# Patient Record
Sex: Female | Born: 1960 | Race: White | Hispanic: Refuse to answer | Smoking: Former smoker
Health system: Southern US, Community
[De-identification: ages and names within clinical notes are randomized; demographics above are authoritative.]

## PROBLEM LIST (undated history)

## (undated) DIAGNOSIS — Z9889 Other specified postprocedural states: Secondary | ICD-10-CM

## (undated) DIAGNOSIS — T7840XA Allergy, unspecified, initial encounter: Secondary | ICD-10-CM

## (undated) DIAGNOSIS — K219 Gastro-esophageal reflux disease without esophagitis: Secondary | ICD-10-CM

## (undated) DIAGNOSIS — R112 Nausea with vomiting, unspecified: Secondary | ICD-10-CM

## (undated) DIAGNOSIS — D126 Benign neoplasm of colon, unspecified: Secondary | ICD-10-CM

## (undated) DIAGNOSIS — K648 Other hemorrhoids: Secondary | ICD-10-CM

## (undated) DIAGNOSIS — C801 Malignant (primary) neoplasm, unspecified: Secondary | ICD-10-CM

## (undated) DIAGNOSIS — K5730 Diverticulosis of large intestine without perforation or abscess without bleeding: Secondary | ICD-10-CM

## (undated) DIAGNOSIS — I10 Essential (primary) hypertension: Secondary | ICD-10-CM

## (undated) DIAGNOSIS — F419 Anxiety disorder, unspecified: Secondary | ICD-10-CM

## (undated) DIAGNOSIS — G43909 Migraine, unspecified, not intractable, without status migrainosus: Secondary | ICD-10-CM

## (undated) DIAGNOSIS — F329 Major depressive disorder, single episode, unspecified: Secondary | ICD-10-CM

## (undated) HISTORY — DX: Gastro-esophageal reflux disease without esophagitis: K21.9

## (undated) HISTORY — DX: Allergy, unspecified, initial encounter: T78.40XA

## (undated) HISTORY — PX: TONSILLECTOMY: SUR1361

## (undated) HISTORY — DX: Benign neoplasm of colon, unspecified: D12.6

## (undated) HISTORY — DX: Anxiety disorder, unspecified: F41.9

## (undated) HISTORY — DX: Migraine, unspecified, not intractable, without status migrainosus: G43.909

## (undated) HISTORY — DX: Major depressive disorder, single episode, unspecified: F32.9

## (undated) HISTORY — PX: COLONOSCOPY: SHX174

## (undated) HISTORY — PX: BREAST SURGERY: SHX581

## (undated) HISTORY — DX: Other hemorrhoids: K64.8

## (undated) HISTORY — DX: Essential (primary) hypertension: I10

## (undated) HISTORY — DX: Malignant (primary) neoplasm, unspecified: C80.1

## (undated) HISTORY — DX: Diverticulosis of large intestine without perforation or abscess without bleeding: K57.30

---

## 1999-12-17 ENCOUNTER — Encounter: Admission: RE | Admit: 1999-12-17 | Discharge: 2000-03-16 | Payer: Self-pay | Admitting: Orthopaedic Surgery

## 2000-01-17 ENCOUNTER — Encounter: Payer: Self-pay | Admitting: Orthopedic Surgery

## 2000-01-17 ENCOUNTER — Ambulatory Visit (HOSPITAL_COMMUNITY): Admission: RE | Admit: 2000-01-17 | Discharge: 2000-01-17 | Payer: Self-pay | Admitting: Orthopedic Surgery

## 2004-04-25 ENCOUNTER — Emergency Department (HOSPITAL_COMMUNITY): Admission: EM | Admit: 2004-04-25 | Discharge: 2004-04-25 | Payer: Self-pay | Admitting: Emergency Medicine

## 2009-02-27 ENCOUNTER — Encounter (INDEPENDENT_AMBULATORY_CARE_PROVIDER_SITE_OTHER): Payer: Self-pay | Admitting: Plastic Surgery

## 2009-02-27 ENCOUNTER — Ambulatory Visit (HOSPITAL_BASED_OUTPATIENT_CLINIC_OR_DEPARTMENT_OTHER): Admission: RE | Admit: 2009-02-27 | Discharge: 2009-02-27 | Payer: Self-pay | Admitting: Plastic Surgery

## 2009-02-27 ENCOUNTER — Encounter (INDEPENDENT_AMBULATORY_CARE_PROVIDER_SITE_OTHER): Payer: Self-pay | Admitting: *Deleted

## 2009-05-02 ENCOUNTER — Encounter: Payer: Self-pay | Admitting: Gastroenterology

## 2009-05-23 ENCOUNTER — Ambulatory Visit: Payer: Self-pay | Admitting: Oncology

## 2009-06-07 ENCOUNTER — Ambulatory Visit: Payer: Self-pay | Admitting: Gastroenterology

## 2009-06-07 DIAGNOSIS — R1032 Left lower quadrant pain: Secondary | ICD-10-CM | POA: Insufficient documentation

## 2009-06-07 DIAGNOSIS — I10 Essential (primary) hypertension: Secondary | ICD-10-CM

## 2009-06-14 ENCOUNTER — Ambulatory Visit: Payer: Self-pay | Admitting: Gastroenterology

## 2009-06-27 ENCOUNTER — Encounter: Payer: Self-pay | Admitting: Gastroenterology

## 2009-07-02 ENCOUNTER — Ambulatory Visit: Payer: Self-pay | Admitting: Oncology

## 2009-07-02 LAB — COMPREHENSIVE METABOLIC PANEL
Albumin: 4.2 g/dL (ref 3.5–5.2)
CO2: 24 mEq/L (ref 19–32)
Calcium: 8.7 mg/dL (ref 8.4–10.5)
Chloride: 107 mEq/L (ref 96–112)
Glucose, Bld: 81 mg/dL (ref 70–99)
Potassium: 3.5 mEq/L (ref 3.5–5.3)
Sodium: 139 mEq/L (ref 135–145)
Total Protein: 7 g/dL (ref 6.0–8.3)

## 2009-07-02 LAB — CBC WITH DIFFERENTIAL/PLATELET
Basophils Absolute: 0.2 10*3/uL — ABNORMAL HIGH (ref 0.0–0.1)
Eosinophils Absolute: 0.1 10*3/uL (ref 0.0–0.5)
HGB: 12.4 g/dL (ref 11.6–15.9)
MONO#: 0.9 10*3/uL (ref 0.1–0.9)
NEUT#: 7.6 10*3/uL — ABNORMAL HIGH (ref 1.5–6.5)
RBC: 4.53 10*6/uL (ref 3.70–5.45)
RDW: 17.9 % — ABNORMAL HIGH (ref 11.2–14.5)
WBC: 12.7 10*3/uL — ABNORMAL HIGH (ref 3.9–10.3)

## 2009-08-30 ENCOUNTER — Ambulatory Visit: Payer: Self-pay | Admitting: Oncology

## 2009-09-03 LAB — CBC WITH DIFFERENTIAL/PLATELET
BASO%: 0.4 % (ref 0.0–2.0)
Eosinophils Absolute: 0.1 10*3/uL (ref 0.0–0.5)
MCHC: 33 g/dL (ref 31.5–36.0)
MONO#: 0.6 10*3/uL (ref 0.1–0.9)
NEUT#: 5.8 10*3/uL (ref 1.5–6.5)
RBC: 4.73 10*6/uL (ref 3.70–5.45)
WBC: 8.9 10*3/uL (ref 3.9–10.3)
lymph#: 2.3 10*3/uL (ref 0.9–3.3)

## 2009-09-03 LAB — COMPREHENSIVE METABOLIC PANEL
ALT: 15 U/L (ref 0–35)
Albumin: 4 g/dL (ref 3.5–5.2)
CO2: 23 mEq/L (ref 19–32)
Calcium: 9 mg/dL (ref 8.4–10.5)
Chloride: 107 mEq/L (ref 96–112)
Potassium: 3.8 mEq/L (ref 3.5–5.3)
Sodium: 141 mEq/L (ref 135–145)
Total Protein: 6.8 g/dL (ref 6.0–8.3)

## 2010-02-05 ENCOUNTER — Ambulatory Visit (HOSPITAL_COMMUNITY): Admission: RE | Admit: 2010-02-05 | Discharge: 2010-02-06 | Payer: Self-pay | Admitting: Obstetrics and Gynecology

## 2010-02-05 ENCOUNTER — Encounter (INDEPENDENT_AMBULATORY_CARE_PROVIDER_SITE_OTHER): Payer: Self-pay | Admitting: Obstetrics and Gynecology

## 2010-02-25 HISTORY — PX: ABDOMINAL HYSTERECTOMY: SHX81

## 2010-03-15 ENCOUNTER — Ambulatory Visit: Payer: Self-pay | Admitting: Oncology

## 2010-03-18 LAB — CBC WITH DIFFERENTIAL/PLATELET
BASO%: 0.4 % (ref 0.0–2.0)
Eosinophils Absolute: 0.1 10*3/uL (ref 0.0–0.5)
HCT: 38.4 % (ref 34.8–46.6)
MCHC: 33.4 g/dL (ref 31.5–36.0)
MONO#: 0.8 10*3/uL (ref 0.1–0.9)
NEUT#: 5.5 10*3/uL (ref 1.5–6.5)
NEUT%: 63.4 % (ref 38.4–76.8)
Platelets: 408 10*3/uL — ABNORMAL HIGH (ref 145–400)
WBC: 8.7 10*3/uL (ref 3.9–10.3)
lymph#: 2.3 10*3/uL (ref 0.9–3.3)

## 2010-03-18 LAB — COMPREHENSIVE METABOLIC PANEL
ALT: 12 U/L (ref 0–35)
CO2: 25 mEq/L (ref 19–32)
Calcium: 8.8 mg/dL (ref 8.4–10.5)
Chloride: 105 mEq/L (ref 96–112)
Creatinine, Ser: 0.83 mg/dL (ref 0.40–1.20)
Glucose, Bld: 104 mg/dL — ABNORMAL HIGH (ref 70–99)
Sodium: 140 mEq/L (ref 135–145)
Total Protein: 6.7 g/dL (ref 6.0–8.3)

## 2010-05-16 ENCOUNTER — Ambulatory Visit: Payer: Self-pay | Admitting: Oncology

## 2010-08-16 ENCOUNTER — Ambulatory Visit (HOSPITAL_BASED_OUTPATIENT_CLINIC_OR_DEPARTMENT_OTHER): Payer: 59 | Admitting: Oncology

## 2010-08-19 ENCOUNTER — Encounter: Payer: Self-pay | Admitting: Plastic Surgery

## 2010-10-13 LAB — SURGICAL PCR SCREEN: Staphylococcus aureus: NEGATIVE

## 2010-10-13 LAB — CBC
HCT: 33 % — ABNORMAL LOW (ref 36.0–46.0)
Hemoglobin: 11 g/dL — ABNORMAL LOW (ref 12.0–15.0)
Hemoglobin: 12.9 g/dL (ref 12.0–15.0)
MCH: 28.3 pg (ref 26.0–34.0)
MCHC: 33.1 g/dL (ref 30.0–36.0)
RBC: 3.83 MIL/uL — ABNORMAL LOW (ref 3.87–5.11)
RDW: 16.9 % — ABNORMAL HIGH (ref 11.5–15.5)
WBC: 17.4 10*3/uL — ABNORMAL HIGH (ref 4.0–10.5)

## 2010-10-13 LAB — COMPREHENSIVE METABOLIC PANEL
AST: 20 U/L (ref 0–37)
CO2: 24 mEq/L (ref 19–32)
Calcium: 8.8 mg/dL (ref 8.4–10.5)
Creatinine, Ser: 0.78 mg/dL (ref 0.4–1.2)
GFR calc Af Amer: >60 mL/min (ref >60)
GFR calc non Af Amer: >60 mL/min (ref >60)

## 2010-10-28 ENCOUNTER — Encounter (HOSPITAL_BASED_OUTPATIENT_CLINIC_OR_DEPARTMENT_OTHER): Payer: 59 | Admitting: Oncology

## 2010-10-28 DIAGNOSIS — R232 Flushing: Secondary | ICD-10-CM

## 2010-10-28 DIAGNOSIS — G43909 Migraine, unspecified, not intractable, without status migrainosus: Secondary | ICD-10-CM

## 2010-10-28 DIAGNOSIS — J40 Bronchitis, not specified as acute or chronic: Secondary | ICD-10-CM

## 2010-10-28 DIAGNOSIS — N6089 Other benign mammary dysplasias of unspecified breast: Secondary | ICD-10-CM

## 2010-10-28 DIAGNOSIS — F3289 Other specified depressive episodes: Secondary | ICD-10-CM

## 2010-10-28 DIAGNOSIS — F329 Major depressive disorder, single episode, unspecified: Secondary | ICD-10-CM

## 2010-10-28 LAB — CBC WITH DIFFERENTIAL/PLATELET
Basophils Absolute: 0.1 10*3/uL (ref 0.0–0.1)
Eosinophils Absolute: 0.1 10*3/uL (ref 0.0–0.5)
HCT: 41.2 % (ref 34.8–46.6)
HGB: 13.7 g/dL (ref 11.6–15.9)
LYMPH%: 27.2 % (ref 14.0–49.7)
MCV: 86.7 fL (ref 79.5–101.0)
MONO#: 1 10*3/uL — ABNORMAL HIGH (ref 0.1–0.9)
MONO%: 7.8 % (ref 0.0–14.0)
NEUT#: 8.2 10*3/uL — ABNORMAL HIGH (ref 1.5–6.5)
NEUT%: 63.5 % (ref 38.4–76.8)
Platelets: 308 10*3/uL (ref 145–400)
RBC: 4.75 10*6/uL (ref 3.70–5.45)
WBC: 12.9 10*3/uL — ABNORMAL HIGH (ref 3.9–10.3)

## 2010-10-28 LAB — COMPREHENSIVE METABOLIC PANEL
Albumin: 3.4 g/dL — ABNORMAL LOW (ref 3.5–5.2)
BUN: 16 mg/dL (ref 6–23)
CO2: 25 mEq/L (ref 19–32)
Glucose, Bld: 98 mg/dL (ref 70–99)
Sodium: 138 mEq/L (ref 135–145)
Total Bilirubin: 0.4 mg/dL (ref 0.3–1.2)
Total Protein: 6.4 g/dL (ref 6.0–8.3)

## 2010-11-02 LAB — BASIC METABOLIC PANEL
CO2: 27 mEq/L (ref 19–32)
Calcium: 9.3 mg/dL (ref 8.4–10.5)
GFR calc Af Amer: >60 mL/min (ref >60)
GFR calc non Af Amer: >60 mL/min (ref >60)
Potassium: 4.8 mEq/L (ref 3.5–5.1)
Sodium: 139 mEq/L (ref 135–145)

## 2010-12-10 NOTE — Op Note (Signed)
Deanna [name], Deanna [name]               ACCOUNT NO.:  [id number]   MEDICAL RECORD NO.:  [id number]          PATIENT TYPE:  AMB   LOCATION:  DSC                          FACILITY:  [facility]   PHYSICIAN:  Brantley Persons, M.D.DATE OF BIRTH:  1960/09/12   DATE OF PROCEDURE:  02/27/2009  DATE OF DISCHARGE:                               OPERATIVE REPORT   PREOPERATIVE DIAGNOSIS:  Bilateral macromastia.   POSTOPERATIVE DIAGNOSIS:  Bilateral macromastia.   PROCEDURE:  Bilateral reduction mammoplasties.   ATTENDING SURGEON:  Brantley Persons, MD   ANESTHESIA:  General.   ANESTHESIOLOGIST:  Zenon Mayo, MD   COMPLICATIONS:  None.   ESTIMATED BLOOD LOSS:  150 mL.   FLUID REPLACEMENT:  Crystalloid 3500 mL.   URINE OUTPUT:  650 mL.   INDICATIONS FOR PROCEDURE:  The patient is a 50 year old Caucasian  female who has bilateral macromastia that is clinically symptomatic.  She presents to undergo bilateral reduction mammoplasties.   PROCEDURE:  The patient was marked in preop holding area.  The pattern  of wise for the future bilateral reduction mammoplasties.  She was then  taken back to the OR, placed on the table in supine position.  After  adequate hemostasis and anesthesia had taken effect, the procedure was  begun.   Both of the breast reductions were performed in the following similar  manner.  The skin was marked with the 45 nipple marker on the nipple-  areolar complexes.  The nipple-areolar complex was incised as marked and  de-epithelialized around the nipple-areolar complex down to inframammary  crease and inferior pedicle pattern.   Next, the medial, superior and lateral skin flaps were elevated down to  the chest wall.  The excess fat and glandular tissue were removed from  the inferior pedicle.  The nipple-areolar complex was examined and found  be pink and viable.  The wound was irrigated with saline irrigation.  Meticulous hemostasis was obtained with the Bovie  electrocautery.  Inferior pedicle was centralized using 3-0 Prolene suture.  A #10 JP  flat fully fluted drain was placed into the wound.  The skin flaps were  brought together at the inverted T junction with a 2-0 Prolene suture.  The incisions were then stapled for temporary closure.  The breasts were  compared and found to have good shape asymmetry.  The incisions were  then closed from the medial aspect of the JP drains to the medial aspect  of the [facility] incision by first placing Monocryl sutures in the dermal  layer to loosely tack them together and then both the dermal and  cuticular layer were closed in a single layer using a 2-0 quill PDO  barbed suture.  Lateral to the JP drain, the incision was closed using 3-  0 Monocryl in the dermal layer followed by 3-0 Monocryl running  intracuticular stitch on the skin.  The vertical limb of the wise  pattern was closed in the dermal layer using 3-0 Monocryl suture.   The patient was then placed in the upright position.  The future  location of the nipple-areolar complex was as marked  in both breast  mounds using the nipple.  The 45-mm nipple areolar complex.  The patient  was then placed back in the recumbent position.   Both of the nipple areolar complexes were then brought in onto the  breast mounds in the following similar manner.  The skin was then  incised as marked and the soft tissue removed full thickness.  The  nipple-areolar complex was examined, found to be pink and viable then  brought out through this aperture and sewn in place using 4-0 Monocryl  and the dermal layer followed by 5-0 Monocryl intracuticular stitch on  the skin.  This 5-0 Monocryl suture was then brought down to close the  cuticular layer of the vertical limb of the wise pattern as well.  The  JP drains were sewn in place using 3-0 nylon suture.  The base of the  breast, skin, and soft tissues were then infiltrated with 0.5% Marcaine  with epinephrine.  The  incisions were dressed with Benzoin and Steri-  Strips and the nipples additionally with bacitracin ointment and  Adaptic.  The 4 x 4s were placed over the incisions and ABD pads in the  axillary areas.  The patient was then placed into light postoperative  support bra.  There were no complications.  The patient tolerated  procedure well.  The final needle and sponge counts were reported to be  correct at the end of the case.   The patient was then awakened from general anesthesia and taken to  recovery room in stable condition.  She was recovered without  complications.  She decided, she would rather go home to stay overnight;  this was appropriate.  The patient and her family were then given proper  preoperative and postoperative wound care instructions including care of  the JP drains.  She was then discharged home in the care of her family  in stable condition.  Follow up appointment will be on Friday in the  office.           ______________________________  Brantley Persons, M.D.     MC/MEDQ  D:  02/27/2009  T:  02/28/2009  Job:  [id number]

## 2011-02-26 HISTORY — PX: BREAST REDUCTION SURGERY: SHX8

## 2011-02-26 HISTORY — PX: NASAL SINUS SURGERY: SHX719

## 2011-07-14 ENCOUNTER — Encounter (INDEPENDENT_AMBULATORY_CARE_PROVIDER_SITE_OTHER): Payer: Self-pay | Admitting: Surgery

## 2011-07-14 ENCOUNTER — Ambulatory Visit (INDEPENDENT_AMBULATORY_CARE_PROVIDER_SITE_OTHER): Payer: 59 | Admitting: Surgery

## 2011-07-14 DIAGNOSIS — K219 Gastro-esophageal reflux disease without esophagitis: Secondary | ICD-10-CM

## 2011-07-14 DIAGNOSIS — K8010 Calculus of gallbladder with chronic cholecystitis without obstruction: Secondary | ICD-10-CM

## 2011-07-14 DIAGNOSIS — R1032 Left lower quadrant pain: Secondary | ICD-10-CM

## 2011-07-14 DIAGNOSIS — K5730 Diverticulosis of large intestine without perforation or abscess without bleeding: Secondary | ICD-10-CM

## 2011-07-14 HISTORY — DX: Diverticulosis of large intestine without perforation or abscess without bleeding: K57.30

## 2011-07-14 NOTE — Patient Instructions (Signed)
Gallbladder Disease Gallbladder disease (cholecystitis) is an inflammation of your gallbladder. It is usually caused by a build-up of stones (gallstones) or sludge (cholelithiasis) in your gallbladder. The gallbladder is not an essential organ. It is located slightly to the right of center in the belly (abdomen), behind the liver. It stores bile made in the liver. Bile aids in digestion of fats. Gallbladder disease may result in nausea (feeling sick to your stomach), abdominal pain, and jaundice. In severe cases, emergency surgery may be required. The most common type of gallbladder disease is gallstones. They begin as small crystals and slowly grow into stones. Gallstone pain occurs when the bile duct has spasms. The spasms are caused by the stone passing out of the duct. The stone is trying to pass at the same time bile is passing into the small bowel for digestion. The pain usually begins suddenly. It may persist from several minutes to several hours. Infection can occur. Infection can add to discomfort and severity of an acute attack. The pain may be made worse by breathing deeply or by being jarred. There may be fever and tenderness to the touch. In some cases, when gallstones do not move into the bile duct, people have no pain or symptoms. These are called "silent" gallstones. Women are three times more likely to develop gallstones than men. Women who have had several pregnancies are more likely to have gallbladder disease. Physicians sometimes advise removing diseased gallbladders before future pregnancies. Other factors that increase the risk of gallbladder disease are obesity, diets heavy in fried foods and dairy products, increasing age, prolonged use of medications containing female hormones, and heredity. HOME CARE INSTRUCTIONS   If your physician prescribed an antibiotic, take as directed.   Only take over-the-counter or prescription medicines for pain, discomfort, or fever as directed by your  caregiver.   Follow a low fat diet until seen again. (Fat causes the gallbladder to contract.)   Follow-up as instructed. Attacks are almost always recurrent and surgery is usually required for permanent treatment.  SEEK IMMEDIATE MEDICAL CARE IF:   Pain is increasing and not controlled by medications.   The pain moves to another part of your abdomen or to your back. (Right sided pain can be appendicitis and left sided pain in adults can be diverticulitis).   You have a fever.   You develop nausea and vomiting.  Document Released: 07/14/2005 Document Revised: 03/26/2011 Document Reviewed: 03/02/2008 [employer] Patient Information 2012 [employer]. 

## 2011-07-14 NOTE — Progress Notes (Addendum)
Subjective:     Patient ID: Deanna [name], female   DOB: 1960/09/16, 50 y.o.   MRN: [id number]  HPI  [name]  01/08/1961 [id number]  Patient Care Team: Wille Celeste as PCP - General (Internal Medicine) Louis Meckel, MD as Consulting Physician (Gastroenterology) Oliver Pila as Consulting Physician (Obstetrics and Gynecology)  This patient is a 50 y.o.female who presents today for surgical evaluation at the request of [name].   Reason for visit: Abdominal pain. History of gallstones. Question biliary etiology  Patient is an obese female with numerous abdominal complaints. She tells me she's had reflux issues on Prilosec daily. She feels like she is having some epigastric discomfort and radiation therapy well.  She intact her for presumed diverticulitis 2 years ago. She had a colonoscopy done by Dr. Arlyce Dice. He noted some resolving diverticulitis. Diverticula were in the sigmoid colon only. A few small polyps as well. The patient notes she gets some mild left lower quadrant pain as well. Her pain was in that location but this is not nearly as severe.  The patient notes about 2 months ago she began to start having problems. She noticed pain on her right lateral chest wall radiating to her right flank. Occasionally radiate to her left side. She would feel some indigestion. Prilosec did not help. He usually was after eating. He got worse around Thanksgiving. She saw her primary care physician. Workup including lab work was normal. Ultrasound did show gallstones. She's never had a CAT scan. She spell of stools and self profess diarrhea. Multiple has helped. I believe he C. difficile was checked and was negative. She normally has some mild constipation.  She is otherwise relatively active. This was not related activity. She can lie down. No fevers or chills or sweats. No nausea or vomiting. Some bloating at times. Her primary care doctor's notes to call some nausea and  vomiting the patient does not progress. However, the patient had a lot of difficulty recalling all these events.  Patient Active Problem List  Diagnoses  . ESSENTIAL HYPERTENSION, BENIGN  . ABDOMINAL PAIN, LEFT LOWER QUADRANT  . Chronic cholecystitis with calculus    No past medical history on file.  No past surgical history on file.  History   Social History  . Marital Status: Divorced    Spouse Name: N/A    Number of Children: N/A  . Years of Education: N/A   Occupational History  . Not on file.   Social History Main Topics  . Smoking status: Not on file  . Smokeless tobacco: Not on file  . Alcohol Use: Not on file  . Drug Use: Not on file  . Sexually Active: Not on file   Other Topics Concern  . Not on file   Social History Narrative  . No narrative on file    No family history on file.  Current outpatient prescriptions:BENICAR 40 MG tablet, daily., Disp: , Rfl: ;  buPROPion (WELLBUTRIN XL) 150 MG 24 hr tablet, 2 tablets daily., Disp: , Rfl: ;  diphenoxylate-atropine (LOMOTIL) 2.5-0.025 MG per tablet, as needed. , Disp: , Rfl: ;  furosemide (LASIX) 20 MG tablet, daily., Disp: , Rfl: ;  montelukast (SINGULAIR) 10 MG tablet, daily., Disp: , Rfl: ;  omeprazole (PRILOSEC) 20 MG capsule, daily., Disp: , Rfl:  SUMAtriptan (IMITREX) 100 MG tablet, Ad lib., Disp: , Rfl: ;  tamoxifen (NOLVADEX) 20 MG tablet, daily., Disp: , Rfl:   Allergies  Allergen Reactions  .  Iohexol Anaphylaxis    IBD dye    There were no vitals taken for this visit.     Review of Systems  Constitutional: Negative for fever, chills, diaphoresis, appetite change and fatigue.  HENT: Negative for ear pain, sore throat, trouble swallowing, neck pain and ear discharge.   Eyes: Negative for photophobia, discharge and visual disturbance.  Respiratory: Negative for cough, choking, chest tightness and shortness of breath.   Cardiovascular: Positive for chest pain. Negative for palpitations.        Patient walks 30 minutes for about 1.5 miles without difficulty.  No exertional chest/neck/shoulder/arm pain.   Gastrointestinal: Positive for abdominal pain and diarrhea. Negative for vomiting, constipation, blood in stool, abdominal distention, anal bleeding and rectal pain.       No personal nor family history of GI/colon cancer, inflammatory bowel disease, irritable bowel syndrome, allergy such as Celiac Sprue, dietary/dairy problems, colitis, ulcers nor gastritis.    No recent sick contacts/gastroenteritis.  No travel outside the country.  No changes in diet.    Genitourinary: Negative for dysuria, frequency and difficulty urinating.  Musculoskeletal: Positive for back pain. Negative for myalgias and gait problem.  Skin: Negative for color change, pallor and rash.  Neurological: Negative for dizziness, speech difficulty, weakness and numbness.  Hematological: Negative for adenopathy.  Psychiatric/Behavioral: Negative for confusion and agitation. The patient is not nervous/anxious.        Objective:   Physical Exam  Constitutional: She is oriented to person, place, and time. She appears well-developed and well-nourished. No distress.  HENT:  Head: Normocephalic.  Mouth/Throat: Oropharynx is clear and moist. No oropharyngeal exudate.  Eyes: Conjunctivae and EOM are normal. Pupils are equal, round, and reactive to light. No scleral icterus.  Neck: Normal range of motion. Neck supple. No tracheal deviation present.  Cardiovascular: Normal rate, regular rhythm and intact distal pulses.   Pulmonary/Chest: Effort normal and breath sounds normal. No respiratory distress. She exhibits no tenderness.  Abdominal: Soft. She exhibits no distension and no mass. There is no tenderness. Hernia confirmed negative in the right inguinal area and confirmed negative in the left inguinal area.  Genitourinary: No vaginal discharge found.  Musculoskeletal: Normal range of motion. She exhibits no  tenderness.  Lymphadenopathy:    She has no cervical adenopathy.       Right: No inguinal adenopathy present.       Left: No inguinal adenopathy present.  Neurological: She is alert and oriented to person, place, and time. No cranial nerve deficit. She exhibits normal muscle tone. Coordination normal.  Skin: Skin is warm and dry. No rash noted. She is not diaphoretic. No erythema.  Psychiatric: She has a normal mood and affect. Her behavior is normal. Judgment and thought content normal.     Studies:  U/S +GS LFTs WNL    Assessment:     Numerous abdominal complaints: One reflux. Probably poorly controlled. A double Prilosec and see if it helps. Skipped clip addresses everything.  Right-sided chest wall/flank pain. Postprandial. Known gallstones. 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 The rest of her differential duct diagnoses is likely. (No alcohol, hepatitis, pancreatitis, ulcers, NSAID use,  right-sided colitis/diverticulitis)  Left lower quadrant abdominal pain mild. It does not seem classic with diverticulitis but with some loose stools.    Plan:     I think she has multiple things going on. I spent some time to chart review and talking to her bed in no acute computer training teased things out.  Double Prilosec to twice a day to see if it helps reflux.  Consider laparoscopic cholecystectomy to address right-sided pain. I strongly cautioned her that this amount of all the problems but I suspect that at least new symptoms should be managed:  The anatomy & physiology of hepatobiliary & pancreatic function was discussed.  The pathophysiology of gallbladder dysfunction was discussed.  Natural history risks without surgery was discussed.   I feel the risks of no intervention will lead to serious problems that outweigh the operative risks; therefore, I recommended cholecystectomy to remove the pathology.  I explained laparoscopic techniques with possible need for an open  approach.  Probable cholangiogram to evaluate the bilary tract was explained as well.    Risks such as bleeding, infection, abscess, leak, injury to other organs, need for further treatment, heart attack, death, and other risks were discussed.  I noted a good likelihood this will help address the problem.  Possibility that this will not correct all abdominal symptoms was explained.  Goals of post-operative recovery were discussed as well.  We will work to minimize complications.  An educational handout further explaining the pathology and treatment options was given as well.  Questions were answered.  The patient expresses understanding & wishes to proceed with surgery.  Another option is to consider a gastroenterology reevaluation to help sort things out. I am pretty certain he would recommend doubling PPIs to start. She feels the pain is more more noticeable on the right side and wishes to proceed with cholecystectomy first. I think is reasonable.  Also start a bowel regimen to help keep her bowels regular fiber consider Align/Activia/oral supplement as well.  Continue Lomotil p.r.n. but avoid overuse

## 2011-07-15 ENCOUNTER — Encounter (HOSPITAL_COMMUNITY): Payer: Self-pay | Admitting: *Deleted

## 2011-07-15 ENCOUNTER — Encounter (HOSPITAL_COMMUNITY): Payer: Self-pay | Admitting: Pharmacy Technician

## 2011-07-15 NOTE — Pre-Procedure Instructions (Signed)
Pt instructed by phone hibiclens shower hs and am of surgery, neck down avoid private area, no shaving

## 2011-07-17 ENCOUNTER — Encounter (HOSPITAL_COMMUNITY): Payer: Self-pay

## 2011-07-17 ENCOUNTER — Encounter (HOSPITAL_COMMUNITY): Payer: Self-pay | Admitting: Anesthesiology

## 2011-07-17 ENCOUNTER — Ambulatory Visit (HOSPITAL_COMMUNITY)
Admission: RE | Admit: 2011-07-17 | Discharge: 2011-07-17 | Disposition: A | Discharge status: Home / Self Care | Payer: 59 | Source: Ambulatory Visit | Attending: Surgery | Admitting: Surgery

## 2011-07-17 ENCOUNTER — Other Ambulatory Visit (INDEPENDENT_AMBULATORY_CARE_PROVIDER_SITE_OTHER): Payer: Self-pay | Admitting: Surgery

## 2011-07-17 ENCOUNTER — Ambulatory Visit (HOSPITAL_COMMUNITY): Payer: 59 | Admitting: Anesthesiology

## 2011-07-17 ENCOUNTER — Encounter (HOSPITAL_COMMUNITY)
Admission: RE | Disposition: A | Discharge status: Home / Self Care | Payer: Self-pay | Source: Ambulatory Visit | Attending: Surgery

## 2011-07-17 ENCOUNTER — Ambulatory Visit (HOSPITAL_COMMUNITY): Payer: 59

## 2011-07-17 ENCOUNTER — Other Ambulatory Visit: Payer: Self-pay

## 2011-07-17 DIAGNOSIS — K8010 Calculus of gallbladder with chronic cholecystitis without obstruction: Secondary | ICD-10-CM | POA: Insufficient documentation

## 2011-07-17 DIAGNOSIS — R0789 Other chest pain: Secondary | ICD-10-CM | POA: Insufficient documentation

## 2011-07-17 DIAGNOSIS — Z01810 Encounter for preprocedural cardiovascular examination: Secondary | ICD-10-CM | POA: Insufficient documentation

## 2011-07-17 DIAGNOSIS — Z01812 Encounter for preprocedural laboratory examination: Secondary | ICD-10-CM | POA: Insufficient documentation

## 2011-07-17 DIAGNOSIS — K219 Gastro-esophageal reflux disease without esophagitis: Secondary | ICD-10-CM | POA: Insufficient documentation

## 2011-07-17 DIAGNOSIS — R1013 Epigastric pain: Secondary | ICD-10-CM | POA: Insufficient documentation

## 2011-07-17 DIAGNOSIS — R1011 Right upper quadrant pain: Secondary | ICD-10-CM | POA: Insufficient documentation

## 2011-07-17 DIAGNOSIS — I10 Essential (primary) hypertension: Secondary | ICD-10-CM | POA: Insufficient documentation

## 2011-07-17 DIAGNOSIS — Z79899 Other long term (current) drug therapy: Secondary | ICD-10-CM | POA: Insufficient documentation

## 2011-07-17 DIAGNOSIS — R1032 Left lower quadrant pain: Secondary | ICD-10-CM | POA: Insufficient documentation

## 2011-07-17 HISTORY — DX: Other specified postprocedural states: R11.2

## 2011-07-17 HISTORY — PX: CHOLECYSTECTOMY: SHX55

## 2011-07-17 HISTORY — DX: Other specified postprocedural states: Z98.890

## 2011-07-17 LAB — BASIC METABOLIC PANEL
Chloride: 108 mEq/L (ref 96–112)
GFR calc Af Amer: >90 mL/min (ref >90)
GFR calc non Af Amer: 80 mL/min — ABNORMAL LOW (ref >90)
Glucose, Bld: 87 mg/dL (ref 70–99)
Potassium: 3.8 mEq/L (ref 3.5–5.1)
Sodium: 143 mEq/L (ref 135–145)

## 2011-07-17 LAB — CBC
Hemoglobin: 13.1 g/dL (ref 12.0–15.0)
MCHC: 33.7 g/dL (ref 30.0–36.0)
WBC: 8 10*3/uL (ref 4.0–10.5)

## 2011-07-17 LAB — SURGICAL PCR SCREEN
MRSA, PCR: NEGATIVE
Staphylococcus aureus: NEGATIVE

## 2011-07-17 SURGERY — LAPAROSCOPIC CHOLECYSTECTOMY SINGLE SITE
Anesthesia: General | Site: Abdomen | Wound class: Clean Contaminated

## 2011-07-17 MED ORDER — LACTATED RINGERS IV SOLN
INTRAVENOUS | Status: DC | PRN
  Administered 2011-07-17: 15:00:00 via INTRAVENOUS

## 2011-07-17 MED ORDER — PROMETHAZINE HCL 25 MG/ML IJ SOLN: 6.2500 mg | INTRAMUSCULAR | Status: DC | PRN

## 2011-07-17 MED ORDER — ACETAMINOPHEN 10 MG/ML IV SOLN
INTRAVENOUS | Status: AC
  Filled 2011-07-17: qty 100

## 2011-07-17 MED ORDER — SODIUM CHLORIDE 0.9 % IJ SOLN: 3.0000 mL | INTRAMUSCULAR | Status: DC | PRN

## 2011-07-17 MED ORDER — BUPIVACAINE-EPINEPHRINE 0.25% -1:200000 IJ SOLN
INTRAMUSCULAR | Status: AC
  Filled 2011-07-17: qty 1

## 2011-07-17 MED ORDER — BUPIVACAINE-EPINEPHRINE 0.25% -1:200000 IJ SOLN
INTRAMUSCULAR | Status: DC | PRN
  Administered 2011-07-17: 50 mL

## 2011-07-17 MED ORDER — MIDAZOLAM HCL 5 MG/5ML IJ SOLN
INTRAMUSCULAR | Status: DC | PRN
  Administered 2011-07-17: 2 mg via INTRAVENOUS

## 2011-07-17 MED ORDER — FENTANYL CITRATE 0.05 MG/ML IJ SOLN
50.0000 ug | INTRAMUSCULAR | Status: DC | PRN
  Administered 2011-07-17: 100 ug via INTRAVENOUS

## 2011-07-17 MED ORDER — GLYCOPYRROLATE 0.2 MG/ML IJ SOLN
INTRAMUSCULAR | Status: DC | PRN
  Administered 2011-07-17: .8 mg via INTRAVENOUS

## 2011-07-17 MED ORDER — LACTATED RINGERS IV SOLN
INTRAVENOUS | Status: DC
  Administered 2011-07-17: 1000 mL via INTRAVENOUS

## 2011-07-17 MED ORDER — SODIUM CHLORIDE 0.9 % IV SOLN: 250.0000 mL | INTRAVENOUS | Status: DC | PRN

## 2011-07-17 MED ORDER — LACTATED RINGERS IV SOLN: INTRAVENOUS | Status: DC

## 2011-07-17 MED ORDER — FENTANYL CITRATE 0.05 MG/ML IJ SOLN: 25.0000 ug | INTRAMUSCULAR | Status: DC | PRN

## 2011-07-17 MED ORDER — SODIUM CHLORIDE 0.9 % IJ SOLN: 3.0000 mL | Freq: Two times a day (BID) | INTRAMUSCULAR | Status: DC

## 2011-07-17 MED ORDER — OXYCODONE HCL 5 MG PO TABS: 5.0000 mg | ORAL_TABLET | ORAL | Status: AC | PRN

## 2011-07-17 MED ORDER — MUPIROCIN 2 % EX OINT
TOPICAL_OINTMENT | CUTANEOUS | Status: AC
  Filled 2011-07-17: qty 22

## 2011-07-17 MED ORDER — NEOSTIGMINE METHYLSULFATE 1 MG/ML IJ SOLN
INTRAMUSCULAR | Status: DC | PRN
  Administered 2011-07-17: 5 mg via INTRAVENOUS

## 2011-07-17 MED ORDER — PROMETHAZINE HCL 25 MG RE SUPP: 25.0000 mg | Freq: Four times a day (QID) | RECTAL | Status: AC | PRN

## 2011-07-17 MED ORDER — PROMETHAZINE HCL 12.5 MG PO TABS
12.5000 mg | ORAL_TABLET | Freq: Four times a day (QID) | ORAL | Status: AC | PRN
Start: 2011-07-17 — End: 2011-07-24

## 2011-07-17 MED ORDER — STERILE WATER FOR IRRIGATION IR SOLN
Status: DC | PRN
  Administered 2011-07-17: 1500 mL

## 2011-07-17 MED ORDER — LACTATED RINGERS IR SOLN
Status: DC | PRN
  Administered 2011-07-17: 1000 mL

## 2011-07-17 MED ORDER — ONDANSETRON HCL 4 MG/2ML IJ SOLN
INTRAMUSCULAR | Status: DC | PRN
  Administered 2011-07-17: 4 mg via INTRAVENOUS

## 2011-07-17 MED ORDER — FENTANYL CITRATE 0.05 MG/ML IJ SOLN
INTRAMUSCULAR | Status: DC | PRN
  Administered 2011-07-17: 50 ug via INTRAVENOUS

## 2011-07-17 MED ORDER — PROPOFOL 10 MG/ML IV EMUL
INTRAVENOUS | Status: DC | PRN
  Administered 2011-07-17: 150 mg via INTRAVENOUS

## 2011-07-17 MED ORDER — KETOROLAC TROMETHAMINE 30 MG/ML IJ SOLN
INTRAMUSCULAR | Status: DC | PRN
  Administered 2011-07-17: 30 mg via INTRAVENOUS

## 2011-07-17 MED ORDER — ROCURONIUM BROMIDE 100 MG/10ML IV SOLN
INTRAVENOUS | Status: DC | PRN
  Administered 2011-07-17: 50 mg via INTRAVENOUS

## 2011-07-17 MED ORDER — FENTANYL CITRATE 0.05 MG/ML IJ SOLN
INTRAMUSCULAR | Status: AC
  Filled 2011-07-17: qty 2

## 2011-07-17 SURGICAL SUPPLY — 50 items
APPLIER CLIP 5 13 M/L LIGAMAX5 (MISCELLANEOUS) ×2
APPLIER CLIP ROT 10 11.4 M/L (STAPLE)
APR CLP MED LRG 11.4X10 (STAPLE)
APR CLP MED LRG 5 ANG JAW (MISCELLANEOUS) ×1
BAG SPEC RTRVL LRG 6X4 10 (ENDOMECHANICALS) ×1
CABLE HIGH FREQUENCY MONO STRZ (ELECTRODE) ×2 IMPLANT
CANISTER SUCTION 2500CC (MISCELLANEOUS) ×2 IMPLANT
CLIP APPLIE 5 13 M/L LIGAMAX5 (MISCELLANEOUS) IMPLANT
CLIP APPLIE ROT 10 11.4 M/L (STAPLE) ×1 IMPLANT
CLOTH BEACON ORANGE TIMEOUT ST (SAFETY) ×2 IMPLANT
COVER MAYO STAND STRL (DRAPES) ×1 IMPLANT
COVER SURGICAL LIGHT HANDLE (MISCELLANEOUS) ×1 IMPLANT
DECANTER SPIKE VIAL GLASS SM (MISCELLANEOUS) ×2 IMPLANT
DRAPE C-ARM 42X72 X-RAY (DRAPES) ×1 IMPLANT
DRAPE LAPAROSCOPIC ABDOMINAL (DRAPES) ×2 IMPLANT
DRSG TEGADERM 2-3/8X2-3/4 SM (GAUZE/BANDAGES/DRESSINGS) ×4 IMPLANT
DRSG TEGADERM 4X4.75 (GAUZE/BANDAGES/DRESSINGS) ×3 IMPLANT
ELECT REM PT RETURN 9FT ADLT (ELECTROSURGICAL) ×2
ELECTRODE REM PT RTRN 9FT ADLT (ELECTROSURGICAL) ×1 IMPLANT
ENDOLOOP SUT PDS II  0 18 (SUTURE) ×2
ENDOLOOP SUT PDS II 0 18 (SUTURE) IMPLANT
GAUZE SPONGE 2X2 8PLY STRL LF (GAUZE/BANDAGES/DRESSINGS) IMPLANT
GLOVE BIOGEL PI IND STRL 7.0 (GLOVE) ×1 IMPLANT
GLOVE BIOGEL PI INDICATOR 7.0 (GLOVE) ×1
GLOVE ECLIPSE 8.0 STRL XLNG CF (GLOVE) ×2 IMPLANT
GLOVE INDICATOR 8.0 STRL GRN (GLOVE) ×2 IMPLANT
GOWN STRL NON-REIN LRG LVL3 (GOWN DISPOSABLE) ×2 IMPLANT
GOWN STRL REIN XL XLG (GOWN DISPOSABLE) ×4 IMPLANT
IV LACTATED RINGER IRRG 3000ML (IV SOLUTION)
IV LR IRRIG 3000ML ARTHROMATIC (IV SOLUTION) ×1 IMPLANT
KIT BASIN OR (CUSTOM PROCEDURE TRAY) ×2 IMPLANT
NS IRRIG 1000ML POUR BTL (IV SOLUTION) ×1 IMPLANT
POUCH SPECIMEN RETRIEVAL 10MM (ENDOMECHANICALS) ×1 IMPLANT
SCALPEL HARMONIC ACE (MISCELLANEOUS) ×1 IMPLANT
SCISSORS LAP 5X35 DISP (ENDOMECHANICALS) ×2 IMPLANT
SCRUB FOAM CHG 2% SURGICAL (MISCELLANEOUS) ×2 IMPLANT
SET CHOLANGIOGRAPH MIX (MISCELLANEOUS) IMPLANT
SET IRRIG TUBING LAPAROSCOPIC (IRRIGATION / IRRIGATOR) ×2 IMPLANT
SLEEVE Z-THREAD 5X100MM (TROCAR) ×4 IMPLANT
SOLUTION ANTI FOG 6CC (MISCELLANEOUS) ×2 IMPLANT
SPONGE GAUZE 2X2 STER 10/PKG (GAUZE/BANDAGES/DRESSINGS) ×1
STRIP CLOSURE SKIN 1/2X4 (GAUZE/BANDAGES/DRESSINGS) ×1 IMPLANT
SUT MNCRL AB 4-0 PS2 18 (SUTURE) ×2 IMPLANT
SUT VICRYL 0 TIES 12 18 (SUTURE) ×2 IMPLANT
TOWEL OR 17X26 10 PK STRL BLUE (TOWEL DISPOSABLE) ×6 IMPLANT
TRAY LAP CHOLE (CUSTOM PROCEDURE TRAY) ×2 IMPLANT
TROCAR BLADELESS OPT 5 150 (ENDOMECHANICALS) ×1 IMPLANT
TROCAR Z-THREAD FIOS 11X100 BL (TROCAR) ×1 IMPLANT
TROCAR Z-THREAD FIOS 5X100MM (TROCAR) ×2 IMPLANT
TUBING INSUFFLATION 10FT LAP (TUBING) ×2 IMPLANT

## 2011-07-17 NOTE — Progress Notes (Signed)
Report given to Jamelle Rushing RN.

## 2011-07-17 NOTE — Transfer of Care (Signed)
Immediate Anesthesia Transfer of Care Note  Patient: Deanna [name]  Procedure(s) Performed:  LAPAROSCOPIC CHOLECYSTECTOMY SINGLE PORT  Patient Location: PACU  Anesthesia Type: General  Level of Consciousness: awake, sedated and patient cooperative  Airway & Oxygen Therapy: Patient Spontanous Breathing and Patient connected to face mask oxygen  Post-op Assessment: Report given to PACU RN and Post -op Vital signs reviewed and stable  Post vital signs: Reviewed and stable  Complications: No apparent anesthesia complications

## 2011-07-17 NOTE — Brief Op Note (Signed)
07/17/2011  4:30 PM  PATIENT:  Deanna [name]  50 y.o. female  PRE-OPERATIVE DIAGNOSIS:  chronic cholelithiasis  POST-OPERATIVE DIAGNOSIS:  chronic cholelithiasis  PROCEDURE:  Procedure(s): LAPAROSCOPIC CHOLECYSTECTOMY SINGLE PORT  SURGEON:  Surgeon(s): Ardeth Sportsman, MD  PHYSICIAN ASSISTANT:   ASSISTANTS: RN   ANESTHESIA:   local and general  EBL:     BLOOD ADMINISTERED:none  DRAINS: none   LOCAL MEDICATIONS USED:  BUPIVICAINE 50CC  SPECIMEN:  Source of Specimen:  Gallblader w gallstones  DISPOSITION OF SPECIMEN:  PATHOLOGY  COUNTS:  YES  TOURNIQUET:  * No tourniquets in log *  DICTATION: .Other Dictation: Dictation Number [id number]  PLAN OF CARE: Discharge to home after PACU  PATIENT DISPOSITION:  PACU - hemodynamically stable.   Delay start of Pharmacological VTE agent (>24hrs) due to surgical blood loss or risk of bleeding:  NOT APPLICABLE:[id number]

## 2011-07-17 NOTE — Interval H&P Note (Signed)
History and Physical Interval Note:  07/17/2011 2:42 PM  Deanna [name]  has presented today for surgery, with the diagnosis of chronic cholelithiasis  The various methods of treatment have been discussed with the patient and family. After consideration of risks, benefits and other options for treatment, the patient has consented to    Procedure(s): LAPAROSCOPIC CHOLECYSTECTOMY SINGLE Site as a surgical intervention .  The patients' history has been reviewed, patient examined, no change in status, stable for surgery.  I have reviewed the patients' chart and labs.  Questions were answered to the patient's satisfaction.     Arshi Duarte C.

## 2011-07-17 NOTE — Progress Notes (Signed)
Pt's daughter is reading over discharge instructions.  Informed pt that she had a prescription for phenergan at her pharmacy [facility] if she needs it.

## 2011-07-17 NOTE — Anesthesia Preprocedure Evaluation (Addendum)
Anesthesia Evaluation  Patient identified by MRN, date of birth, ID band Patient awake    Reviewed: Allergy & Precautions, H&P , NPO status , Patient's Chart, lab work & pertinent test results  History of Anesthesia Complications (+) PONV  Airway Mallampati: II TM Distance: >3 FB Neck ROM: full    Dental No notable dental hx. (+) Teeth Intact and Dental Advisory Given   Pulmonary asthma ,  Mild asthma clear to auscultation  Pulmonary exam normal       Cardiovascular Exercise Tolerance: Good hypertension, On Medications neg cardio ROS regular Normal    Neuro/Psych Negative Neurological ROS  Negative Psych ROS   GI/Hepatic negative GI ROS, Neg liver ROS, GERD-  Medicated and Controlled,  Endo/Other  Negative Endocrine ROS  Renal/GU negative Renal ROS  Genitourinary negative   Musculoskeletal   Abdominal   Peds  Hematology negative hematology ROS (+)   Anesthesia Other Findings   Reproductive/Obstetrics negative OB ROS                          Anesthesia Physical Anesthesia Plan  ASA: II  Anesthesia Plan: General   Post-op Pain Management:    Induction: Intravenous  Airway Management Planned: Oral ETT  Additional Equipment:   Intra-op Plan:   Post-operative Plan: Extubation in OR  Informed Consent: I have reviewed the patients History and Physical, chart, labs and discussed the procedure including the risks, benefits and alternatives for the proposed anesthesia with the patient or authorized representative who has indicated his/her understanding and acceptance.   Dental Advisory Given  Plan Discussed with: CRNA and Surgeon  Anesthesia Plan Comments:         Anesthesia Quick Evaluation

## 2011-07-17 NOTE — H&P (View-Only) (Signed)
Subjective:     Patient ID: Deanna [name], female   DOB: Jan 18, 1961, 50 y.o.   MRN: [id number]  HPI  KALLISTA PAE  Oct 12, 1960 [id number]  Patient Care Team: Wille Celeste as PCP - General (Internal Medicine)  This patient is a 50 y.o.female who presents today for surgical evaluation at the request of [name].   Reason for visit: Abdominal pain. History of gallstones. Question biliary etiology  Patient is an obese female with numerous abdominal complaints. She tells me she's had reflux issues on Prilosec daily. She feels like she is having some epigastric discomfort and radiation therapy well.  She intact her for presumed diverticulitis 2 years ago. She had a colonoscopy done by Dr. Oscar La. He noted some resolving diverticulitis. Diverticula were in the sigmoid colon only. A few small polyps as well. The patient notes she gets some mild left lower quadrant pain as well. Her pain was in that location but this is not nearly as severe.  The patient notes about 2 months ago she began to start having problems. She noticed pain on her right lateral chest wall radiating to her right flank. Occasionally radiate to her left side. She would feel some indigestion. Prilosec did not help. He usually was after eating. He got worse around Thanksgiving. She saw her primary care physician. Workup including lab work was normal. Ultrasound did show gallstones. She's never had a CAT scan. She spell of stools and self profess diarrhea. Multiple has helped. I believe he C. difficile was checked and was negative. She normally has some mild constipation.  She is otherwise relatively active. This was not related activity. She can lie down. No fevers or chills or sweats. No nausea or vomiting. Some bloating at times. Her primary care doctor's notes to call some nausea and vomiting the patient does not progress. However, the patient had a lot of difficulty recalling all these events.  Patient Active Problem  List  Diagnoses  . ESSENTIAL HYPERTENSION, BENIGN  . ABDOMINAL PAIN, LEFT LOWER QUADRANT  . Chronic cholecystitis with calculus    No past medical history on file.  No past surgical history on file.  History   Social History  . Marital Status: Divorced    Spouse Name: N/A    Number of Children: N/A  . Years of Education: N/A   Occupational History  . Not on file.   Social History Main Topics  . Smoking status: Not on file  . Smokeless tobacco: Not on file  . Alcohol Use: Not on file  . Drug Use: Not on file  . Sexually Active: Not on file   Other Topics Concern  . Not on file   Social History Narrative  . No narrative on file    No family history on file.  Current outpatient prescriptions:BENICAR 40 MG tablet, daily., Disp: , Rfl: ;  buPROPion (WELLBUTRIN XL) 150 MG 24 hr tablet, 2 tablets daily., Disp: , Rfl: ;  diphenoxylate-atropine (LOMOTIL) 2.5-0.025 MG per tablet, as needed. , Disp: , Rfl: ;  furosemide (LASIX) 20 MG tablet, daily., Disp: , Rfl: ;  montelukast (SINGULAIR) 10 MG tablet, daily., Disp: , Rfl: ;  omeprazole (PRILOSEC) 20 MG capsule, daily., Disp: , Rfl:  SUMAtriptan (IMITREX) 100 MG tablet, Ad lib., Disp: , Rfl: ;  tamoxifen (NOLVADEX) 20 MG tablet, daily., Disp: , Rfl:   Allergies  Allergen Reactions  . Iohexol Anaphylaxis    IBD dye    There were no vitals taken for  this visit.     Review of Systems  Constitutional: Negative for fever, chills, diaphoresis, appetite change and fatigue.  HENT: Negative for ear pain, sore throat, trouble swallowing, neck pain and ear discharge.   Eyes: Negative for photophobia, discharge and visual disturbance.  Respiratory: Negative for cough, choking, chest tightness and shortness of breath.   Cardiovascular: Positive for chest pain. Negative for palpitations.       Patient walks 30 minutes for about 1.5 miles without difficulty.  No exertional chest/neck/shoulder/arm pain.   Gastrointestinal: Positive  for abdominal pain and diarrhea. Negative for vomiting, constipation, blood in stool, abdominal distention, anal bleeding and rectal pain.       No personal nor family history of GI/colon cancer, inflammatory bowel disease, irritable bowel syndrome, allergy such as Celiac Sprue, dietary/dairy problems, colitis, ulcers nor gastritis.    No recent sick contacts/gastroenteritis.  No travel outside the country.  No changes in diet.    Genitourinary: Negative for dysuria, frequency and difficulty urinating.  Musculoskeletal: Positive for back pain. Negative for myalgias and gait problem.  Skin: Negative for color change, pallor and rash.  Neurological: Negative for dizziness, speech difficulty, weakness and numbness.  Hematological: Negative for adenopathy.  Psychiatric/Behavioral: Negative for confusion and agitation. The patient is not nervous/anxious.        Objective:   Physical Exam  Constitutional: She is oriented to person, place, and time. She appears well-developed and well-nourished. No distress.  HENT:  Head: Normocephalic.  Mouth/Throat: Oropharynx is clear and moist. No oropharyngeal exudate.  Eyes: Conjunctivae and EOM are normal. Pupils are equal, round, and reactive to light. No scleral icterus.  Neck: Normal range of motion. Neck supple. No tracheal deviation present.  Cardiovascular: Normal rate, regular rhythm and intact distal pulses.   Pulmonary/Chest: Effort normal and breath sounds normal. No respiratory distress. She exhibits no tenderness.  Abdominal: Soft. She exhibits no distension and no mass. There is no tenderness. Hernia confirmed negative in the right inguinal area and confirmed negative in the left inguinal area.  Genitourinary: No vaginal discharge found.  Musculoskeletal: Normal range of motion. She exhibits no tenderness.  Lymphadenopathy:    She has no cervical adenopathy.       Right: No inguinal adenopathy present.       Left: No inguinal adenopathy  present.  Neurological: She is alert and oriented to person, place, and time. No cranial nerve deficit. She exhibits normal muscle tone. Coordination normal.  Skin: Skin is warm and dry. No rash noted. She is not diaphoretic. No erythema.  Psychiatric: She has a normal mood and affect. Her behavior is normal. Judgment and thought content normal.     Studies:  U/S +GS LFTs WNL    Assessment:     Numerous abdominal complaints: One reflux. Probably poorly controlled. A double Prilosec and see if it helps. Skipped clip addresses everything.  Right-sided chest wall/flank pain. Postprandial. Known gallstones. 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 The rest of her differential duct diagnoses is likely. (No alcohol, hepatitis, pancreatitis, ulcers, NSAID use,  right-sided colitis/diverticulitis)  Left lower quadrant abdominal pain mild. It does not seem classic with diverticulitis but with some loose stools.    Plan:     I think she has multiple things going on. I spent some time to chart review and talking to her bed in no acute computer training teased things out.  Double Prilosec to twice a day to see if it helps reflux.  Consider laparoscopic  cholecystectomy to address right-sided pain. I strongly cautioned her that this amount of all the problems but I suspect that at least new symptoms should be managed:  The anatomy & physiology of hepatobiliary & pancreatic function was discussed.  The pathophysiology of gallbladder dysfunction was discussed.  Natural history risks without surgery was discussed.   I feel the risks of no intervention will lead to serious problems that outweigh the operative risks; therefore, I recommended cholecystectomy to remove the pathology.  I explained laparoscopic techniques with possible need for an open approach.  Probable cholangiogram to evaluate the bilary tract was explained as well.    Risks such as bleeding, infection, abscess, leak, injury to  other organs, need for further treatment, heart attack, death, and other risks were discussed.  I noted a good likelihood this will help address the problem.  Possibility that this will not correct all abdominal symptoms was explained.  Goals of post-operative recovery were discussed as well.  We will work to minimize complications.  An educational handout further explaining the pathology and treatment options was given as well.  Questions were answered.  The patient expresses understanding & wishes to proceed with surgery.  Another option is to consider a gastroenterology reevaluation to help sort things out. I am pretty certain he would recommend doubling PPIs or any weight. She feels more noticeable on the right side and wishes to proceed with cholecystectomy first. I think is reasonable.  Also start a bowel regimen to help keep her bowels regular fiber consider activity S./line oral supplement as well.  Continue Lomotil p.r.n. but avoid overuse

## 2011-07-17 NOTE — Anesthesia Postprocedure Evaluation (Signed)
  Anesthesia Post-op Note  Patient: Deanna [name]  Procedure(s) Performed:  LAPAROSCOPIC CHOLECYSTECTOMY SINGLE PORT  Patient Location: PACU  Anesthesia Type: General  Level of Consciousness: awake and alert   Airway and Oxygen Therapy: Patient Spontanous Breathing  Post-op Pain: mild  Post-op Assessment: Post-op Vital signs reviewed, Patient's Cardiovascular Status Stable, Respiratory Function Stable, Patent Airway and No signs of Nausea or vomiting  Post-op Vital Signs: stable  Complications: No apparent anesthesia complications

## 2011-07-17 NOTE — Progress Notes (Signed)
Pt ambulated to bathroom and voided qs. No Nausea. States she does have some pain (4/10) but feels it is gas pain and declines a pain pill. Ambulated in hallway and tolerated well. Verbalized understanding of d/c instructions. Home with daughter.

## 2011-07-17 NOTE — Progress Notes (Signed)
To OR Via XRAY for CXR

## 2011-07-17 NOTE — Progress Notes (Signed)
Pt is doing well.  Pt is tolerating po fluids and eating saltine crackers.  Pt does not have any nausea complaints and states her pain is in the upper/epi gastric area 5/10 pt does not want to take a pain pill at this time.

## 2011-07-18 NOTE — Op Note (Signed)
[name], [name]               ACCOUNT NO.:  [id number]  MEDICAL RECORD NO.:  [id number]  LOCATION:  [facility]                         FACILITY:  [facility]  PHYSICIAN:  Ardeth Sportsman, MD     DATE OF BIRTH:  February 03, 1961  DATE OF PROCEDURE:  07/17/2011 DATE OF DISCHARGE:  07/17/2011                              OPERATIVE REPORT   PRIMARY CARE PHYSICIAN:  Dr. Yetta [name].  GYNECOLOGIST:  Deanna [name], M.D.  GASTROENTEROLOGIST:  Deanna [name]. Deanna Dice, MD,FACG  SURGEON:  Ardeth Sportsman, MD.  ASSISTANT:  RN.  PREOPERATIVE DIAGNOSES:  Right upper quadrant abdominal pain, gallstones, probable chronic cholecystitis.  POSTOP DIAGNOSES:  Right upper quadrant abdominal pain, gallstones, probable chronic cholecystitis.  PROCEDURE PERFORMED:  Laparoscopic cholecystectomy (single site technique).  ANESTHESIA: 1. General anesthesia. 2. Local anesthetic in a field block around the port sites.  SPECIMEN:  Gallbladder.  DRAINS:  None.  ESTIMATED BLOOD LOSS:  Minimal.  COMPLICATIONS:  None apparent.  INDICATIONS:  Ms. Deanna [name] is a 50 year old morbidly obese female who has been struggling with intermittent abdominal pains.  I believe, she has numerous issues going on.  It has been a little challenging to tease things out.  She has had gastroenterology evaluation which noted diverticulitis in distant past, but her symptoms are more in the right upper quadrant now.  Given the fact that her symptoms seem to be more postprandial and certainly significant portion of that are noted and some episodes of nausea and vomiting, she was sent to me for surgical evaluation.  The anatomy and physiology of hepatobiliary and pancreatic function was discussed.  Pathophysiology of cholecystolithiasis and natural history, risks were discussed.  Differential diagnosis was discussed.  Options discussed.  Recommendation made for laparoscopic cholecystectomy.  Single site multiple port or open techniques  were discussed.  Risks, benefits, and alternatives were discussed.  I spent some extra time discussing with her that this may probably will not resolve all of her abdominal complaints and may not resolve any, but given the fact the rest of her workup has been underwhelming and her symptoms seem to be more consistent with biliary colic, I recommend considering surgery and she wished to proceed.  She has a severe anaphylactic reaction to Omnipaque contrast.  She has not had an increased liver function tests.  She has not had any episodes of jaundice.  CAT scan did not show any enlarged biliary system. Therefore, it was decided it is safe to avoid cholangiogram.  OPERATIVE FINDINGS:  She had a mildly thickened gallbladder.  She had gallstones x2 of moderate size around 8 mm.  She did not have any fatty change of liver.  Her rest of her abdomen did not have any evidence of any abscess or bowel obstruction or adhesions or internal hernia.  No strong evidence of gastritis or enteritis or appendicitis or colitis.  DESCRIPTION OF PROCEDURE:  Informed consent was confirmed.  The patient underwent general anesthesia without any difficulty.  She was positioned in supine with arms tucked.  She had voided prior to coming to the operating room.  Her abdomen was prepped and draped in sterile fashion. Surgical time-out confirmed our plan.  I made a transverse  incision over the superior part of the umbilical fold.  We did a cutdown technique and placed a long 5 mm bariatric 5 port through the supraumbilical fascia. Entry was clean.  I induced carbon dioxide insufflation.  Camera inspection revealed no injury.  I saw no adhesions to the anterior abdominal wall.  I could see the gallbladder without any major inflammation.  I decided to proceed with single site technique. Therefore, I placed a 5 mm port in the left upper aspect of the wound and a 5 mm grasper in the right inferior aspect of the wound.   I converted that grasper to a longer bariatric one given her abdominal girth.  I was able to find the gallbladder fundus and elevated cephalad.  There were only a few wispy adhesions of omentum to it.  I freed the peritoneal coverings between the gallbladder and the liver using ultrasonic and blunt dissection.  I came around the posterior laterally and freed the posterior half of the gallbladder off the liver bed and off the posterior lateral wall.  I came around anteriorly.  I isolated a cystic artery.  After getting a good circumferential view in freeing the proximal half of the gallbladder off on the anterior side as well and getting a good critical view of just the infundibulum and the cystic artery, I went ahead and ligated using ultrasonic dissection.  I skeletonized the cystic duct off the infundibulum more proximally.  It was rather thickened.  I placed 2 clips on the infundibulum and 3 on the gallbladder side.  I did went ahead and transected it close to the infundibulum.  Because it was thickened, I went ahead and placed a 0 PDS endo-loop around the cystic duct stump with was good ligation.  I freed the gallbladder from its main attachments on the liver bed.  I did inspection and saw good hemostasis on the liver.  Clips were intact on the cystic duct and no leak of bile or blood.  I went ahead and evacuated carbon dioxide.  I removed the gallbladder out the supraumbilical fascia 5 mm port site.  I gently dilated up just to get enough room to get the gallbladder out.  I reapproximated the fascia transversely using 0 Vicryl interrupted stitches.  I closed the skin. Sterile dressing was applied.  The patient was extubated to go to recovery room.  I anticipate she should be able to go home later today if she meets discharge goals. I discussed the patient's status to the family.  Questions were answered.  They expressed understanding & appreciation.      Ardeth Sportsman,  MD     SCG/MEDQ  D:  07/17/2011  T:  07/18/2011  Job:  [id number]  cc:   Dr. Hillery Aldo, M.D. Fax: [phone]  Deanna [name]. Deanna Dice, MD,FACG [address] [place] [address]

## 2011-08-08 ENCOUNTER — Encounter (INDEPENDENT_AMBULATORY_CARE_PROVIDER_SITE_OTHER): Payer: Self-pay

## 2011-08-11 ENCOUNTER — Ambulatory Visit (INDEPENDENT_AMBULATORY_CARE_PROVIDER_SITE_OTHER): Payer: 59 | Admitting: Surgery

## 2011-08-11 ENCOUNTER — Encounter (INDEPENDENT_AMBULATORY_CARE_PROVIDER_SITE_OTHER): Payer: Self-pay | Admitting: Surgery

## 2011-08-11 ENCOUNTER — Encounter (INDEPENDENT_AMBULATORY_CARE_PROVIDER_SITE_OTHER): Payer: Self-pay

## 2011-08-11 VITALS — BP 142/98 | HR 70 | Temp 97.5°F | Resp 16 | Ht 66.0 in | Wt 232.0 lb

## 2011-08-11 DIAGNOSIS — K8010 Calculus of gallbladder with chronic cholecystitis without obstruction: Secondary | ICD-10-CM

## 2011-08-11 NOTE — Patient Instructions (Signed)

## 2011-08-11 NOTE — Progress Notes (Signed)
Subjective:     Patient ID: Deanna [name], female   DOB: Jun 02, 1961, 51 y.o.   MRN: [id number]  HPI  Deanna [name]  06/29/1961 [id number]  Patient Care Team: Providence Lanius as PCP - General (Internal Medicine) Louis Meckel, MD as Consulting Physician (Gastroenterology) Oliver Pila as Consulting Physician (Obstetrics and Gynecology)  This patient is a 51 y.o.female who presents today for surgical evaluation.  Diagnosis: Chronic cholecystitis  Procedure: Left upper cholecystectomy with cholangiogram in 07/17/2011.  The patient comes in today feeling better. Soreness going down. Some occasional loose stools. Try and do just her diet. It seems to be improving on its own. Energy level coming back. This lay better than she was before surgery. She is a little disappointed she is now back to normal just yet. Off all narcotics. No heartburn or reflux. No blood. No nausea or vomiting. No more attacks.   Patient Active Problem List  Diagnoses  . ESSENTIAL HYPERTENSION, BENIGN  . ABDOMINAL PAIN, LEFT LOWER QUADRANT  . Chronic cholecystitis with calculus  . GERD (gastroesophageal reflux disease)  . Diverticulosis of sigmoid colon    Past Medical History  Diagnosis Date  . Asthma   . GERD (gastroesophageal reflux disease)   . Hypertension   . Diverticulosis of sigmoid colon 07/14/2011  . PONV (postoperative nausea and vomiting)     Past Surgical History  Procedure Date  . Nasal sinus surgery 02/2011  . Breast reduction surgery 02/2011  . Abdominal hysterectomy 02/2010    partial  . Tonsillectomy age 24  . Cholecystectomy 07/17/11    w/IOC    History   Social History  . Marital Status: Divorced    Spouse Name: N/A    Number of Children: N/A  . Years of Education: N/A   Occupational History  . Not on file.   Social History Main Topics  . Smoking status: Former Smoker -- 0.2 packs/day for 2 years    Quit date: 07/28/2000  . Smokeless tobacco: Never Used    . Alcohol Use: No  . Drug Use: No  . Sexually Active: Not on file   Other Topics Concern  . Not on file   Social History Narrative  . No narrative on file    Family History  Problem Relation Age of Onset  . Cancer Paternal Grandmother     colon    Current outpatient prescriptions:oxyCODONE (OXY IR/ROXICODONE) 5 MG immediate release tablet, as needed., Disp: , Rfl: ;  BENICAR 40 MG tablet, Take 40 mg by mouth every morning. , Disp: , Rfl: ;  buPROPion (WELLBUTRIN XL) 150 MG 24 hr tablet, Take 300 mg by mouth every morning. , Disp: , Rfl: ;  diphenoxylate-atropine (LOMOTIL) 2.5-0.025 MG per tablet, Take 1 tablet by mouth 4 (four) times daily as needed. DIARRHEA , Disp: , Rfl:  furosemide (LASIX) 20 MG tablet, Take 20 mg by mouth every morning. , Disp: , Rfl: ;  montelukast (SINGULAIR) 10 MG tablet, Take 10 mg by mouth daily. , Disp: , Rfl: ;  omeprazole (PRILOSEC) 20 MG capsule, Take 20 mg by mouth daily. , Disp: , Rfl: ;  SUMAtriptan (IMITREX) 100 MG tablet, Take 100 mg by mouth every 2 (two) hours as needed. HEADACHE , Disp: , Rfl: ;  tamoxifen (NOLVADEX) 20 MG tablet, Take 20 mg by mouth every morning. , Disp: , Rfl:   Allergies  Allergen Reactions  . Iohexol Anaphylaxis    IBD dye    BP 142/98  Pulse 70  Temp(Src) 97.5 F (36.4 C) (Temporal)  Resp 16  Ht 5\' 6"  (1.676 m)  Wt 232 lb (105.235 kg)  BMI 37.45 kg/m2    Review of Systems  Constitutional: Negative for fever, chills and diaphoresis.  HENT: Negative for ear pain, sore throat and trouble swallowing.   Eyes: Negative for photophobia and visual disturbance.  Respiratory: Negative for cough and choking.   Cardiovascular: Negative for chest pain and palpitations.  Gastrointestinal: Positive for abdominal pain. Negative for nausea, vomiting, constipation, blood in stool, abdominal distention, anal bleeding and rectal pain.  Genitourinary: Negative for dysuria, frequency and difficulty urinating.  Musculoskeletal:  Negative for myalgias and gait problem.  Skin: Negative for color change, pallor and rash.  Neurological: Negative for dizziness, speech difficulty, weakness and numbness.  Hematological: Negative for adenopathy.  Psychiatric/Behavioral: Negative for confusion and agitation. The patient is not nervous/anxious.        Objective:   Physical Exam  Constitutional: She is oriented to person, place, and time. She appears well-developed and well-nourished. No distress.  HENT:  Head: Normocephalic.  Mouth/Throat: Oropharynx is clear and moist. No oropharyngeal exudate.  Eyes: Conjunctivae and EOM are normal. Pupils are equal, round, and reactive to light. No scleral icterus.  Neck: Normal range of motion. No tracheal deviation present.  Cardiovascular: Normal rate and intact distal pulses.   Pulmonary/Chest: Effort normal. No respiratory distress. She exhibits no tenderness.  Abdominal: Soft. She exhibits no distension and no mass. There is no rebound and no guarding. Hernia confirmed negative in the right inguinal area and confirmed negative in the left inguinal area.       Incisions clean with normal healing ridges.  No hernias.  Mildly sore  Genitourinary: No vaginal discharge found.  Musculoskeletal: Normal range of motion. She exhibits no tenderness.  Lymphadenopathy:       Right: No inguinal adenopathy present.       Left: No inguinal adenopathy present.  Neurological: She is alert and oriented to person, place, and time. No cranial nerve deficit. She exhibits normal muscle tone. Coordination normal.  Skin: Skin is warm and dry. No rash noted. She is not diaphoretic.  Psychiatric: She has a normal mood and affect. Her behavior is normal.       Assessment:     3 weeks s/p lap chole recovering relatively well    Plan:     It does not surprise me given her other health issues and how long she had discomfort that she is not normal just yet. However, her incision has minimal discomfort,  she no longer is having attacks, & she is off narcotics. I think she is improving. She feels reassured. I did noted that it may take another month or so for her to more fully resolve.  [name].Increase activity as tolerated.  Do not push through pain.  Advanced on diet as tolerated. Bowel regimen to avoid problems.  Return to clinic p.r.n. The patient expressed understanding and appreciation

## 2011-10-23 ENCOUNTER — Telehealth: Payer: Self-pay | Admitting: Internal Medicine

## 2011-10-23 NOTE — Telephone Encounter (Signed)
called pt lmov[facility] for appt in may2013

## 2011-12-05 ENCOUNTER — Other Ambulatory Visit: Payer: 59 | Admitting: Lab

## 2011-12-05 ENCOUNTER — Ambulatory Visit: Payer: 59 | Admitting: Oncology

## 2012-01-05 ENCOUNTER — Other Ambulatory Visit: Payer: Self-pay | Admitting: Medical Oncology

## 2012-01-05 DIAGNOSIS — K8010 Calculus of gallbladder with chronic cholecystitis without obstruction: Secondary | ICD-10-CM

## 2012-01-06 ENCOUNTER — Telehealth: Payer: Self-pay | Admitting: Oncology

## 2012-01-06 NOTE — Telephone Encounter (Signed)
lmonvm adviisng the pt of her aug 2013 appts °

## 2012-03-16 ENCOUNTER — Telehealth: Payer: Self-pay | Admitting: *Deleted

## 2012-03-16 NOTE — Telephone Encounter (Signed)
Patient called and resceduled for 04-28-2012 staring at 10:30am

## 2012-03-17 ENCOUNTER — Ambulatory Visit: Payer: 59 | Admitting: Oncology

## 2012-03-17 ENCOUNTER — Other Ambulatory Visit: Payer: 59 | Admitting: Lab

## 2012-04-28 ENCOUNTER — Other Ambulatory Visit: Payer: 59 | Admitting: Lab

## 2012-04-28 ENCOUNTER — Telehealth: Payer: Self-pay | Admitting: *Deleted

## 2012-04-28 ENCOUNTER — Ambulatory Visit: Payer: 59 | Admitting: Oncology

## 2012-04-28 NOTE — Telephone Encounter (Signed)
Patient called in stated she has an migraine and would not be able to make it this morning patient rescheduled for 05-31-2012 at 1:00pm

## 2012-05-28 ENCOUNTER — Other Ambulatory Visit: Payer: Self-pay | Admitting: Adult Health

## 2012-05-28 DIAGNOSIS — C50919 Malignant neoplasm of unspecified site of unspecified female breast: Secondary | ICD-10-CM

## 2012-05-31 ENCOUNTER — Other Ambulatory Visit: Payer: 59 | Admitting: Lab

## 2012-05-31 ENCOUNTER — Ambulatory Visit: Payer: 59 | Admitting: Oncology

## 2013-03-15 ENCOUNTER — Telehealth: Payer: Self-pay

## 2013-03-15 NOTE — Telephone Encounter (Signed)
rc

## 2013-03-15 NOTE — Telephone Encounter (Signed)
Spoke with daughter Brunilda Payor and she states will bring her in on Friday for the low sodium  ok'd per Dr Modesto Charon

## 2014-03-15 ENCOUNTER — Encounter: Payer: Self-pay | Admitting: Gastroenterology

## 2014-03-30 ENCOUNTER — Encounter: Payer: Self-pay | Admitting: Gastroenterology

## 2014-10-31 ENCOUNTER — Encounter: Payer: Self-pay | Admitting: Gastroenterology

## 2016-05-08 ENCOUNTER — Other Ambulatory Visit: Payer: Self-pay | Admitting: Physician Assistant

## 2016-06-05 ENCOUNTER — Other Ambulatory Visit: Payer: Self-pay | Admitting: Physician Assistant

## 2016-06-09 ENCOUNTER — Ambulatory Visit (INDEPENDENT_AMBULATORY_CARE_PROVIDER_SITE_OTHER): Payer: 59 | Admitting: Physician Assistant

## 2016-06-09 ENCOUNTER — Encounter: Payer: Self-pay | Admitting: Physician Assistant

## 2016-06-09 VITALS — BP 141/90 | HR 90 | Temp 97.7°F | Ht 66.0 in | Wt 198.2 lb

## 2016-06-09 DIAGNOSIS — R635 Abnormal weight gain: Secondary | ICD-10-CM

## 2016-06-09 DIAGNOSIS — I10 Essential (primary) hypertension: Secondary | ICD-10-CM | POA: Diagnosis not present

## 2016-06-09 DIAGNOSIS — J209 Acute bronchitis, unspecified: Secondary | ICD-10-CM

## 2016-06-09 DIAGNOSIS — Z6831 Body mass index (BMI) 31.0-31.9, adult: Secondary | ICD-10-CM | POA: Diagnosis not present

## 2016-06-09 DIAGNOSIS — G43809 Other migraine, not intractable, without status migrainosus: Secondary | ICD-10-CM

## 2016-06-09 MED ORDER — VENTOLIN HFA 108 (90 BASE) MCG/ACT IN AERS: 2.0000 | INHALATION_SPRAY | Freq: Four times a day (QID) | RESPIRATORY_TRACT | 11 refills | Status: DC | PRN

## 2016-06-09 MED ORDER — PHENTERMINE HCL 37.5 MG PO CAPS: 37.5000 mg | ORAL_CAPSULE | ORAL | 3 refills | Status: DC

## 2016-06-09 MED ORDER — METHYLPREDNISOLONE ACETATE 80 MG/ML IJ SUSP
80.0000 mg | Freq: Once | INTRAMUSCULAR | Status: AC
  Administered 2016-06-09: 80 mg via INTRAMUSCULAR

## 2016-06-09 MED ORDER — LEVOFLOXACIN 500 MG PO TABS: 500.0000 mg | ORAL_TABLET | Freq: Every day | ORAL | 0 refills | Status: DC

## 2016-06-09 MED ORDER — OLMESARTAN MEDOXOMIL 40 MG PO TABS: 40.0000 mg | ORAL_TABLET | ORAL | 11 refills | Status: DC

## 2016-06-09 NOTE — Patient Instructions (Signed)

## 2016-06-09 NOTE — Progress Notes (Signed)
BP (!) 141/90   Pulse 90   Temp 97.7 F (36.5 C) (Oral)   Ht 5\' 6"  (1.676 m)   Wt 198 lb 3.2 oz (89.9 kg)   BMI 31.99 kg/m    Subjective:    Patient ID: Deanna [name], female    DOB: Nov 25, 1960, 55 y.o.   MRN: [id number]  Deanna [name] is a 55 y.o. female presenting on 06/09/2016 for Hypertension; Chills; facial pressure; Ear Pain; and Sinusitis  HPI Patient here to be established as new patient at [facility].  This patient is known to me from [facility]. She is here for blood pressure and medication check but in the past few days began a severe sinus infection, cough and wheezing. She has frequently had bronchitis in the past. Some fever and chills. Denies any blood. Denies NVD.  This patient comes in for periodic recheck on medications and conditions. All medications are reviewed today. Seems that the pharmacy may not have been filling her diovan correctly. Her medication number is the same on her omeprazole and olmesartan. She will be talking with the pharmacy. All of the medical conditions are reviewed and updated.  Lab work is reviewed and will be ordered as medically necessary.    Past Medical History:  Diagnosis Date  . Allergy   . Anxiety   . Asthma   . Cancer ([place])    breast  . Depression   . Diverticulosis of sigmoid colon 07/14/2011  . GERD (gastroesophageal reflux disease)   . Hypertension   . Migraine   . PONV (postoperative nausea and vomiting)    Relevant past medical, surgical, family and social history reviewed and updated as indicated. Interim medical history since our last visit reviewed. Allergies and medications reviewed and updated.   Data reviewed from any sources in EPIC.  Review of Systems  Constitutional: Positive for chills, fatigue and fever.  HENT: Positive for congestion, postnasal drip and sore throat.   Eyes: Negative.   Respiratory: Positive for cough and wheezing. Negative for shortness of breath.     Cardiovascular: Negative for chest pain, palpitations and leg swelling.  Gastrointestinal: Negative.   Genitourinary: Negative.      Social History   Social History  . Marital status: Divorced    Spouse name: N/A  . Number of children: N/A  . Years of education: N/A   Occupational History  . Not on file.   Social History Main Topics  . Smoking status: Former Smoker    Packs/day: 0.25    Years: 2.00    Quit date: 07/28/2000  . Smokeless tobacco: Never Used  . Alcohol use No  . Drug use: No  . Sexual activity: Not on file   Other Topics Concern  . Not on file   Social History Narrative  . No narrative on file    Past Surgical History:  Procedure Laterality Date  . ABDOMINAL HYSTERECTOMY  02/2010   partial  . BREAST REDUCTION SURGERY  02/2011  . BREAST SURGERY     cancer  . CHOLECYSTECTOMY  07/17/11   w/IOC  . NASAL SINUS SURGERY  02/2011  . TONSILLECTOMY  age 24    Family History  Problem Relation Age of Onset  . Mental illness Mother   . Stroke Mother   . Heart disease Mother   . Hyperlipidemia Mother   . Alcohol abuse Father   . Cancer Paternal Grandmother     colon  . Alcohol abuse  Brother   . Asthma Brother   . Hypertension Brother       Medication List       Accurate as of 06/09/16 12:11 PM. Always use your most recent med list.          ALPRAZolam 1 MG tablet Commonly known as:  XANAX TAKE (1) TABLET DAILY AT BEDTIME.   buPROPion 150 MG 24 hr tablet Commonly known as:  WELLBUTRIN XL Take 300 mg by mouth every morning.   diphenoxylate-atropine 2.5-0.025 MG tablet Commonly known as:  LOMOTIL Take 1 tablet by mouth 4 (four) times daily as needed. DIARRHEA   furosemide 20 MG tablet Commonly known as:  LASIX Take 20 mg by mouth every morning.   levofloxacin 500 MG tablet Commonly known as:  LEVAQUIN Take 1 tablet (500 mg total) by mouth daily.   montelukast 10 MG tablet Commonly known as:  SINGULAIR Take 10 mg by mouth daily.    olmesartan 40 MG tablet Commonly known as:  BENICAR Take 1 tablet (40 mg total) by mouth every morning.   omeprazole 40 MG capsule Commonly known as:  PRILOSEC   oxyCODONE 5 MG immediate release tablet Commonly known as:  Oxy IR/ROXICODONE as needed.   phentermine 37.5 MG capsule Take 1 capsule (37.5 mg total) by mouth every morning.   SUMAtriptan 100 MG tablet Commonly known as:  IMITREX Take 100 mg by mouth every 2 (two) hours as needed. HEADACHE   tamoxifen 20 MG tablet Commonly known as:  NOLVADEX Take 20 mg by mouth every morning.   valACYclovir 1000 MG tablet Commonly known as:  VALTREX   VENTOLIN HFA 108 (90 Base) MCG/ACT inhaler Generic drug:  albuterol Inhale 2 puffs into the lungs every 6 (six) hours as needed for wheezing or shortness of breath.          Objective:    BP (!) 141/90   Pulse 90   Temp 97.7 F (36.5 C) (Oral)   Ht 5\' 6"  (1.676 m)   Wt 198 lb 3.2 oz (89.9 kg)   BMI 31.99 kg/m   Allergies  Allergen Reactions  . Iohexol Anaphylaxis    IBD dye   Wt Readings from Last 3 Encounters:  06/09/16 198 lb 3.2 oz (89.9 kg)  08/11/11 232 lb (105.2 kg)  07/17/11 228 lb (103.4 kg)    Physical Exam  Constitutional: She is oriented to person, place, and time. She appears well-developed and well-nourished.  HENT:  Head: Normocephalic and atraumatic.  Right Ear: Tympanic membrane and external ear normal. No middle ear effusion.  Left Ear: Tympanic membrane and external ear normal.  No middle ear effusion.  Nose: Mucosal edema and rhinorrhea present. Right sinus exhibits no maxillary sinus tenderness. Left sinus exhibits no maxillary sinus tenderness.  Mouth/Throat: Uvula is midline. Posterior oropharyngeal erythema present.  Eyes: Conjunctivae and EOM are normal. Pupils are equal, round, and reactive to light. Right eye exhibits no discharge. Left eye exhibits no discharge.  Neck: Normal range of motion.  Cardiovascular: Normal rate, regular  rhythm and normal heart sounds.   Pulmonary/Chest: Effort normal and breath sounds normal. No respiratory distress. She has no wheezes.  Abdominal: Soft.  Lymphadenopathy:    She has no cervical adenopathy.  Neurological: She is alert and oriented to person, place, and time.  Skin: Skin is warm and dry.  Psychiatric: She has a normal mood and affect.        Assessment & Plan:   1. Essential hypertension, benign -  olmesartan (BENICAR) 40 MG tablet; Take 1 tablet (40 mg total) by mouth every morning.  Dispense: 30 tablet; Refill: 11  2. Acute bronchitis, unspecified organism - methylPREDNISolone acetate (DEPO-MEDROL) injection 80 mg; Inject 1 mL (80 mg total) into the muscle once. - levofloxacin (LEVAQUIN) 500 MG tablet; Take 1 tablet (500 mg total) by mouth daily.  Dispense: 7 tablet; Refill: 0 - VENTOLIN HFA 108 (90 Base) MCG/ACT inhaler; Inhale 2 puffs into the lungs every 6 (six) hours as needed for wheezing or shortness of breath.  Dispense: 1 Inhaler; Refill: 11  3. Other migraine without status migrainosus, not intractable  4. Weight gain - phentermine 37.5 MG capsule; Take 1 capsule (37.5 mg total) by mouth every morning.  Dispense: 30 capsule; Refill: 3  5. Body mass index 31.0-31.9, adult   Continue all other maintenance medications as listed above. Educational handout given for bronchitis  Follow up plan: Return in about 3 months (around 09/09/2016).  Terald Sleeper PA-C [facility] [address]  [place], [place] [address] [phone]   06/09/2016, 12:11 PM

## 2016-06-11 ENCOUNTER — Telehealth: Payer: Self-pay

## 2016-06-11 NOTE — Telephone Encounter (Signed)
appt scheduled Pt notifed

## 2016-06-13 ENCOUNTER — Ambulatory Visit (INDEPENDENT_AMBULATORY_CARE_PROVIDER_SITE_OTHER): Payer: 59 | Admitting: Physician Assistant

## 2016-06-13 ENCOUNTER — Encounter: Payer: Self-pay | Admitting: Physician Assistant

## 2016-06-13 VITALS — BP 130/89 | HR 80 | Temp 97.7°F | Ht 66.0 in | Wt 198.0 lb

## 2016-06-13 DIAGNOSIS — J209 Acute bronchitis, unspecified: Secondary | ICD-10-CM | POA: Diagnosis not present

## 2016-06-13 MED ORDER — PREDNISONE 10 MG (48) PO TBPK: ORAL_TABLET | Freq: Every day | ORAL | 1 refills | Status: DC

## 2016-06-13 MED ORDER — CLARITHROMYCIN 500 MG PO TABS: 500.0000 mg | ORAL_TABLET | Freq: Two times a day (BID) | ORAL | 1 refills | Status: DC

## 2016-06-13 NOTE — Progress Notes (Signed)
BP 130/89   Pulse 80   Temp 97.7 F (36.5 C) (Oral)   Ht 5\' 6"  (1.676 m)   Wt 198 lb (89.8 kg)   BMI 31.96 kg/m    Subjective:    Patient ID: Deanna [name], female    DOB: [birth year], 55 y.o.   MRN: [id number]  HPI: Deanna [name] is a 55 y.o. female presenting on 06/13/2016 for Bronchitis  Patient comes in for recheck on her bronchitis. She states she is not feeling much better. It has become more concentrated in her lungs. In the past she has had multiple episodes. When she took the prednisone for the first 6 days it made the most difference. After she stopped it the lungs became more tight and was wheezing. She denies any fever and chills at this time.  Relevant past medical, surgical, family and social history reviewed and updated as indicated. Allergies and medications reviewed and updated.  Past Medical History:  Diagnosis Date  . Allergy   . Anxiety   . Asthma   . Cancer ([place])    breast  . Depression   . Diverticulosis of sigmoid colon 07/14/2011  . GERD (gastroesophageal reflux disease)   . Hypertension   . Migraine   . PONV (postoperative nausea and vomiting)     Past Surgical History:  Procedure Laterality Date  . ABDOMINAL HYSTERECTOMY  02/2010   partial  . BREAST REDUCTION SURGERY  02/2011  . BREAST SURGERY     cancer  . CHOLECYSTECTOMY  07/17/11   w/IOC  . NASAL SINUS SURGERY  02/2011  . TONSILLECTOMY  age 65    Review of Systems  Constitutional: Negative.   HENT: Positive for congestion, postnasal drip and sore throat.   Eyes: Negative.   Respiratory: Positive for cough and wheezing.   Gastrointestinal: Negative.   Genitourinary: Negative.       Medication List       Accurate as of 06/13/16 11:16 AM. Always use your most recent med list.          ALPRAZolam 1 MG tablet Commonly known as:  XANAX TAKE (1) TABLET DAILY AT BEDTIME.   buPROPion 150 MG 24 hr tablet Commonly known as:  WELLBUTRIN XL Take 300 mg by mouth every morning.   clarithromycin 500 MG tablet Commonly known as:  BIAXIN Take 1 tablet (500 mg total) by mouth 2 (two) times daily.   diphenoxylate-atropine 2.5-0.025 MG tablet Commonly known as:  LOMOTIL Take 1 tablet by mouth 4 (four) times daily as needed. DIARRHEA   furosemide 20 MG tablet Commonly known as:  LASIX Take 20 mg by mouth every morning.   levofloxacin 500 MG tablet Commonly known as:  LEVAQUIN Take 1 tablet (500 mg total) by mouth daily.   montelukast 10 MG tablet Commonly known as:  SINGULAIR Take 10 mg by mouth daily.   olmesartan 40 MG tablet Commonly known as:  BENICAR Take 1 tablet (40 mg total) by mouth every morning.   omeprazole 40 MG capsule Commonly known as:  PRILOSEC   oxyCODONE 5 MG immediate release tablet Commonly known as:  Oxy IR/ROXICODONE as needed.   phentermine 37.5 MG capsule Take 1 capsule (37.5 mg total) by mouth every morning.   predniSONE 10 MG (48) Tbpk tablet Commonly known as:  STERAPRED UNI-PAK 48 TAB Take by mouth daily.   SUMAtriptan 100 MG tablet Commonly known as:  IMITREX Take 100 mg by mouth every 2 (two) hours as needed. HEADACHE  tamoxifen 20 MG tablet Commonly known as:  NOLVADEX Take 20 mg by mouth every morning.   valACYclovir 1000 MG tablet Commonly known as:  VALTREX   VENTOLIN HFA 108 (90 Base) MCG/ACT inhaler Generic drug:  albuterol Inhale 2 puffs into the lungs every 6 (six) hours as needed for wheezing or shortness of breath.          Objective:    BP 130/89   Pulse 80   Temp 97.7 F (36.5 C) (Oral)   Ht 5\' 6"  (1.676 m)   Wt 198 lb (89.8 kg)   BMI 31.96 kg/m   Allergies  Allergen Reactions  . Iohexol Anaphylaxis    IBD dye    Physical Exam  Constitutional: She is oriented to person, place, and time. She appears well-developed and well-nourished.  HENT:  Head: Normocephalic and atraumatic.  Right Ear: There is drainage and tenderness.  Left Ear: There is drainage and tenderness.  Nose:  Mucosal edema and rhinorrhea present. Right sinus exhibits maxillary sinus tenderness and frontal sinus tenderness. Left sinus exhibits maxillary sinus tenderness and frontal sinus tenderness.  Mouth/Throat: Oropharyngeal exudate and posterior oropharyngeal erythema present.  Eyes: Conjunctivae and EOM are normal. Pupils are equal, round, and reactive to light.  Neck: Normal range of motion. Neck supple.  Cardiovascular: Normal rate, regular rhythm, normal heart sounds and intact distal pulses.   Pulmonary/Chest: Effort normal. She has wheezes in the right upper field and the left upper field.  Abdominal: Soft. Bowel sounds are normal.  Neurological: She is alert and oriented to person, place, and time. She has normal reflexes.  Skin: Skin is warm and dry. No rash noted.  Psychiatric: She has a normal mood and affect. Her behavior is normal. Judgment and thought content normal.        Assessment & Plan:   1. Acute bronchitis, unspecified organism - clarithromycin (BIAXIN) 500 MG tablet; Take 1 tablet (500 mg total) by mouth 2 (two) times daily.  Dispense: 20 tablet; Refill: 1 - predniSONE (STERAPRED UNI-PAK 48 TAB) 10 MG (48) TBPK tablet; Take by mouth daily.  Dispense: 48 tablet; Refill: 1   Out 06/09/16-06/15/16, will need FMLA  Continue all other maintenance medications as listed above.  Follow up plan: Follow-up as needed or worsening of symptoms. Call office for any issues.  Educational handout given for bronchitis  Terald Sleeper PA-C [facility] [address]  [place], [place] [address] [phone]   06/13/2016, 11:16 AM

## 2016-06-13 NOTE — Patient Instructions (Signed)

## 2016-06-20 DIAGNOSIS — Z029 Encounter for administrative examinations, unspecified: Secondary | ICD-10-CM

## 2016-07-08 ENCOUNTER — Other Ambulatory Visit: Payer: Self-pay | Admitting: Physician Assistant

## 2016-07-30 ENCOUNTER — Ambulatory Visit: Payer: 59 | Admitting: Physician Assistant

## 2016-07-31 ENCOUNTER — Encounter: Payer: Self-pay | Admitting: Physician Assistant

## 2016-07-31 ENCOUNTER — Telehealth: Payer: Self-pay | Admitting: Physician Assistant

## 2016-08-15 ENCOUNTER — Other Ambulatory Visit: Payer: Self-pay | Admitting: Physician Assistant

## 2016-08-21 NOTE — Telephone Encounter (Signed)
lmtcb if still needs appt

## 2016-09-05 ENCOUNTER — Ambulatory Visit (INDEPENDENT_AMBULATORY_CARE_PROVIDER_SITE_OTHER): Payer: 59 | Admitting: Family Medicine

## 2016-09-05 ENCOUNTER — Encounter: Payer: Self-pay | Admitting: Family Medicine

## 2016-09-05 VITALS — BP 154/97 | HR 66 | Temp 97.3°F | Ht 66.0 in | Wt 198.2 lb

## 2016-09-05 DIAGNOSIS — J0101 Acute recurrent maxillary sinusitis: Secondary | ICD-10-CM

## 2016-09-05 DIAGNOSIS — J4521 Mild intermittent asthma with (acute) exacerbation: Secondary | ICD-10-CM | POA: Diagnosis not present

## 2016-09-05 DIAGNOSIS — J45909 Unspecified asthma, uncomplicated: Secondary | ICD-10-CM | POA: Insufficient documentation

## 2016-09-05 MED ORDER — PREDNISONE 20 MG PO TABS: ORAL_TABLET | ORAL | 0 refills | Status: DC

## 2016-09-05 MED ORDER — CEFDINIR 300 MG PO CAPS: 300.0000 mg | ORAL_CAPSULE | Freq: Two times a day (BID) | ORAL | 0 refills | Status: DC

## 2016-09-05 NOTE — Patient Instructions (Signed)
Great to meet you!   Sinusitis, Adult Sinusitis is soreness and inflammation of your sinuses. Sinuses are hollow spaces in the bones around your face. They are located:  Around your eyes.  In the middle of your forehead.  Behind your nose.  In your cheekbones. Your sinuses and nasal passages are lined with a stringy fluid (mucus). Mucus normally drains out of your sinuses. When your nasal tissues get inflamed or swollen, the mucus can get trapped or blocked so air cannot flow through your sinuses. This lets bacteria, viruses, and funguses grow, and that leads to infection. Follow these instructions at home: Medicines  Take, use, or apply over-the-counter and prescription medicines only as told by your doctor. These may include nasal sprays.  If you were prescribed an antibiotic medicine, take it as told by your doctor. Do not stop taking the antibiotic even if you start to feel better. Hydrate and Humidify  Drink enough water to keep your pee (urine) clear or pale yellow.  Use a cool mist humidifier to keep the humidity level in your home above 50%.  Breathe in steam for 10-15 minutes, 3-4 times a day or as told by your doctor. You can do this in the bathroom while a hot shower is running.  Try not to spend time in cool or dry air. Rest  Rest as much as possible.  Sleep with your head raised (elevated).  Make sure to get enough sleep each night. General instructions  Put a warm, moist washcloth on your face 3-4 times a day or as told by your doctor. This will help with discomfort.  Wash your hands often with soap and water. If there is no soap and water, use hand sanitizer.  Do not smoke. Avoid being around people who are smoking (secondhand smoke).  Keep all follow-up visits as told by your doctor. This is important. Contact a doctor if:  You have a fever.  Your symptoms get worse.  Your symptoms do not get better within 10 days. Get help right away if:  You  have a very bad headache.  You cannot stop throwing up (vomiting).  You have pain or swelling around your face or eyes.  You have trouble seeing.  You feel confused.  Your neck is stiff.  You have trouble breathing. This information is not intended to replace advice given to you by your health care provider. Make sure you discuss any questions you have with your health care provider. Document Released: 12/31/2007 Document Revised: 03/09/2016 Document Reviewed: 05/09/2015 Elsevier Interactive Patient Education  2017 [employer].

## 2016-09-05 NOTE — Progress Notes (Signed)
   HPI  Patient presents today here with cough and facial pain.  Patient has had cough, shortness of breath, nasal congestion, left ear pain, frontal headache for the last 2 weeks or so.  She states that she was febrile about 7 days ago up to 100.0.  She's been using albuterol with some improvement.  She has Advair available but is not using it.  She is using Flonase, Singulair  PMH: Smoking status noted ROS: Per HPI  Objective: BP (!) 154/97   Pulse 66   Temp 97.3 F (36.3 C) (Oral)   Ht 5\' 6"  (1.676 m)   Wt 198 lb 3.2 oz (89.9 kg)   BMI 31.99 kg/m  Gen: NAD, alert, cooperative with exam HEENT: NCAT, EOMI, PERRL CV: RRR, good S1/S2, no murmur Resp: Nonlabored, coarse breath sounds scattered, however no persistent rhonchi or severe wheezing. Good air movement Abd: SNTND, BS present, no guarding or organomegaly Ext: No edema, warm Neuro: Alert and oriented, No gross deficits  Assessment and plan:  # Asthma exacerbation, acute sinusitis Treat with Omnicef plus prednisone Supportive care discussed Usual course of illness discuss, call or return for any worsening symptoms or failure to improve as expected.    Meds ordered this encounter  Medications  . cefdinir (OMNICEF) 300 MG capsule    Sig: Take 1 capsule (300 mg total) by mouth 2 (two) times daily. 1 po BID    Dispense:  20 capsule    Refill:  0  . predniSONE (DELTASONE) 20 MG tablet    Sig: 2 po at same time daily for 5 days    Dispense:  10 tablet    Refill:  0    Laroy Apple, MD [facility] 09/05/2016, 1:13 PM

## 2016-09-10 ENCOUNTER — Encounter: Payer: Self-pay | Admitting: Physician Assistant

## 2016-09-10 ENCOUNTER — Ambulatory Visit (INDEPENDENT_AMBULATORY_CARE_PROVIDER_SITE_OTHER): Payer: 59 | Admitting: Physician Assistant

## 2016-09-10 VITALS — BP 138/88 | HR 80 | Temp 97.9°F | Ht 66.0 in | Wt 200.2 lb

## 2016-09-10 DIAGNOSIS — J40 Bronchitis, not specified as acute or chronic: Secondary | ICD-10-CM | POA: Diagnosis not present

## 2016-09-10 DIAGNOSIS — B379 Candidiasis, unspecified: Secondary | ICD-10-CM

## 2016-09-10 MED ORDER — BUPROPION HCL ER (XL) 150 MG PO TB24: 450.0000 mg | ORAL_TABLET | Freq: Every day | ORAL | 11 refills | Status: DC

## 2016-09-10 MED ORDER — FLUTICASONE-SALMETEROL 250-50 MCG/DOSE IN AEPB: 1.0000 | INHALATION_SPRAY | Freq: Two times a day (BID) | RESPIRATORY_TRACT | 5 refills | Status: DC

## 2016-09-10 MED ORDER — FLUCONAZOLE 150 MG PO TABS: 150.0000 mg | ORAL_TABLET | Freq: Every day | ORAL | 0 refills | Status: DC

## 2016-09-10 MED ORDER — FUROSEMIDE 20 MG PO TABS: 20.0000 mg | ORAL_TABLET | Freq: Every day | ORAL | 3 refills | Status: DC

## 2016-09-10 MED ORDER — METHYLPREDNISOLONE ACETATE 80 MG/ML IJ SUSP: 80.0000 mg | Freq: Once | INTRAMUSCULAR | Status: DC

## 2016-09-10 MED ORDER — HYDROCOD POLST-CPM POLST ER 10-8 MG/5ML PO SUER: 5.0000 mL | Freq: Two times a day (BID) | ORAL | 0 refills | Status: DC | PRN

## 2016-09-10 NOTE — Patient Instructions (Signed)
Yeast  Skin Yeast Infection Skin yeast infection is a condition in which there is an overgrowth of yeast (candida) that normally lives on the skin. This condition usually occurs in areas of the skin that are constantly warm and moist, such as the armpits or the groin. What are the causes? This condition is caused by a change in the normal balance of the yeast and bacteria that live on the skin. What increases the risk? This condition is more likely to develop in:  People who are obese.  Pregnant women.  Women who take birth control pills.  People who have diabetes.  People who take antibiotic medicines.  People who take steroid medicines.  People who are malnourished.  People who have a weak defense (immune) system.  People who are 86 years of age or older. What are the signs or symptoms? Symptoms of this condition include:  A red, swollen area of the skin.  Bumps on the skin.  Itchiness. How is this diagnosed? This condition is diagnosed with a medical history and physical exam. Your health care provider may check for yeast by taking light scrapings of the skin to be viewed under a microscope. How is this treated? This condition is treated with medicine. Medicines may be prescribed or be available over-the-counter. The medicines may be:  Taken by mouth (orally).  Applied as a cream. Follow these instructions at home:  Take or apply over-the-counter and prescription medicines only as told by your health care provider.  Eat more yogurt. This may help to keep your yeast infection from returning.  Maintain a healthy weight. If you need help losing weight, talk with your health care provider.  Keep your skin clean and dry.  If you have diabetes, keep your blood sugar under control. Contact a health care provider if:  Your symptoms go away and then return.  Your symptoms do not get better with treatment.  Your symptoms get worse.  Your rash spreads.  You have  a fever or chills.  You have new symptoms.  You have new warmth or redness of your skin. This information is not intended to replace advice given to you by your health care provider. Make sure you discuss any questions you have with your health care provider. Document Released: 04/01/2011 Document Revised: 03/09/2016 Document Reviewed: 01/15/2015 Elsevier Interactive Patient Education  2017 [employer].

## 2016-09-14 NOTE — Progress Notes (Signed)
BP 138/88   Pulse 80   Temp 97.9 F (36.6 C) (Oral)   Ht 5\' 6"  (1.676 m)   Wt 200 lb 3.2 oz (90.8 kg)   BMI 32.31 kg/m    Subjective:    Patient ID: Deanna [name], female    DOB: September 11, 1960, 56 y.o.   MRN: [id number]  HPI: Deanna [name] is a 56 y.o. female presenting on 09/10/2016 for Follow-up (3 month ) and Cough    Relevant past medical, surgical, family and social history reviewed and updated as indicated. Allergies and medications reviewed and updated.  Past Medical History:  Diagnosis Date  . Allergy   . Anxiety   . Asthma   . Cancer ([place])    breast  . Depression   . Diverticulosis of sigmoid colon 07/14/2011  . GERD (gastroesophageal reflux disease)   . Hypertension   . Migraine   . PONV (postoperative nausea and vomiting)     Past Surgical History:  Procedure Laterality Date  . ABDOMINAL HYSTERECTOMY  02/2010   partial  . BREAST REDUCTION SURGERY  02/2011  . BREAST SURGERY     cancer  . CHOLECYSTECTOMY  07/17/11   w/IOC  . NASAL SINUS SURGERY  02/2011  . TONSILLECTOMY  age 26    Review of Systems  Constitutional: Positive for chills, fatigue and fever. Negative for activity change and appetite change.  HENT: Positive for congestion, postnasal drip, sinus pain, sinus pressure and sore throat.   Eyes: Negative.   Respiratory: Positive for cough, shortness of breath and wheezing.   Cardiovascular: Negative.  Negative for chest pain, palpitations and leg swelling.  Gastrointestinal: Negative.   Genitourinary: Negative.   Musculoskeletal: Negative.   Skin: Negative.   Neurological: Positive for headaches.    Allergies as of 09/10/2016      Reactions   Iohexol Anaphylaxis   IBD dye      Medication List       Accurate as of 09/10/16 11:59 PM. Always use your most recent med list.          ALPRAZolam 1 MG tablet Commonly known as:  XANAX TAKE (1) TABLET DAILY AT BEDTIME.   buPROPion 150 MG 24 hr tablet Commonly known as:  WELLBUTRIN  XL Take 3 tablets (450 mg total) by mouth daily.   cefdinir 300 MG capsule Commonly known as:  OMNICEF Take 1 capsule (300 mg total) by mouth 2 (two) times daily. 1 po BID   chlorpheniramine-HYDROcodone 10-8 MG/5ML Suer Commonly known as:  TUSSIONEX Take 5 mLs by mouth every 12 (twelve) hours as needed for cough.   diphenoxylate-atropine 2.5-0.025 MG tablet Commonly known as:  LOMOTIL Take 1 tablet by mouth 4 (four) times daily as needed. DIARRHEA   fluconazole 150 MG tablet Commonly known as:  DIFLUCAN Take 1 tablet (150 mg total) by mouth daily.   Fluticasone-Salmeterol 250-50 MCG/DOSE Aepb Commonly known as:  ADVAIR DISKUS Inhale 1 puff into the lungs 2 (two) times daily.   furosemide 20 MG tablet Commonly known as:  LASIX Take 1 tablet (20 mg total) by mouth daily.   montelukast 10 MG tablet Commonly known as:  SINGULAIR Take 10 mg by mouth daily.   olmesartan 40 MG tablet Commonly known as:  BENICAR Take 1 tablet (40 mg total) by mouth every morning.   oxyCODONE 5 MG immediate release tablet Commonly known as:  Oxy IR/ROXICODONE as needed.   phentermine 37.5 MG capsule Take 1 capsule (37.5 mg total) by  mouth every morning.   predniSONE 20 MG tablet Commonly known as:  DELTASONE 2 po at same time daily for 5 days   SUMAtriptan 100 MG tablet Commonly known as:  IMITREX Take 100 mg by mouth every 2 (two) hours as needed. HEADACHE   valACYclovir 1000 MG tablet Commonly known as:  VALTREX   VENTOLIN HFA 108 (90 Base) MCG/ACT inhaler Generic drug:  albuterol Inhale 2 puffs into the lungs every 6 (six) hours as needed for wheezing or shortness of breath.          Objective:    BP 138/88   Pulse 80   Temp 97.9 F (36.6 C) (Oral)   Ht 5\' 6"  (1.676 m)   Wt 200 lb 3.2 oz (90.8 kg)   BMI 32.31 kg/m   Allergies  Allergen Reactions  . Iohexol Anaphylaxis    IBD dye    Physical Exam  Constitutional: She is oriented to person, place, and time. She  appears well-developed and well-nourished.  HENT:  Head: Normocephalic and atraumatic.  Right Ear: There is drainage and tenderness.  Left Ear: There is drainage and tenderness.  Nose: Mucosal edema and rhinorrhea present. Right sinus exhibits maxillary sinus tenderness and frontal sinus tenderness. Left sinus exhibits maxillary sinus tenderness and frontal sinus tenderness.  Mouth/Throat: Oropharyngeal exudate and posterior oropharyngeal erythema present.  Eyes: Conjunctivae and EOM are normal. Pupils are equal, round, and reactive to light.  Neck: Normal range of motion. Neck supple.  Cardiovascular: Normal rate, regular rhythm, normal heart sounds and intact distal pulses.   Pulmonary/Chest: Effort normal. She has wheezes in the right upper field and the left upper field.  Abdominal: Soft. Bowel sounds are normal.  Neurological: She is alert and oriented to person, place, and time. She has normal reflexes.  Skin: Skin is warm and dry. No rash noted.  Psychiatric: She has a normal mood and affect. Her behavior is normal. Judgment and thought content normal.        Assessment & Plan:   1. Bronchitis - methylPREDNISolone acetate (DEPO-MEDROL) injection 80 mg; Inject 1 mL (80 mg total) into the muscle once. - chlorpheniramine-HYDROcodone ([place]) 10-8 MG/5ML SUER; Take 5 mLs by mouth every 12 (twelve) hours as needed for cough.  Dispense: 120 mL; Refill: 0 - Fluticasone-Salmeterol (ADVAIR DISKUS) 250-50 MCG/DOSE AEPB; Inhale 1 puff into the lungs 2 (two) times daily.  Dispense: 1 each; Refill: 5  2. Yeast infection - fluconazole (DIFLUCAN) 150 MG tablet; Take 1 tablet (150 mg total) by mouth daily.  Dispense: 10 tablet; Refill: 0   Continue all other maintenance medications as listed above.  Follow up plan: Return if symptoms worsen or fail to improve.  Educational handout given for bronchitis  Terald Sleeper PA-C [facility] [address]    [place], [place] [address] [phone]   09/14/2016, 6:25 PM

## 2016-09-15 ENCOUNTER — Other Ambulatory Visit: Payer: Self-pay | Admitting: Physician Assistant

## 2016-09-15 NOTE — Progress Notes (Unsigned)
referral

## 2016-10-04 ENCOUNTER — Other Ambulatory Visit: Payer: Self-pay | Admitting: Physician Assistant

## 2016-10-14 ENCOUNTER — Ambulatory Visit (INDEPENDENT_AMBULATORY_CARE_PROVIDER_SITE_OTHER): Payer: 59 | Admitting: Family Medicine

## 2016-10-14 ENCOUNTER — Encounter: Payer: Self-pay | Admitting: Family Medicine

## 2016-10-14 VITALS — BP 157/92 | HR 76 | Temp 97.1°F | Ht 66.0 in | Wt 200.2 lb

## 2016-10-14 DIAGNOSIS — J0111 Acute recurrent frontal sinusitis: Secondary | ICD-10-CM | POA: Diagnosis not present

## 2016-10-14 MED ORDER — DOXYCYCLINE HYCLATE 100 MG PO TABS: 100.0000 mg | ORAL_TABLET | Freq: Two times a day (BID) | ORAL | 0 refills | Status: DC

## 2016-10-14 NOTE — Patient Instructions (Signed)

## 2016-10-14 NOTE — Progress Notes (Signed)
   HPI  Patient presents today here with sinus pain and pressure and cough.  Patient explains that she's having frontal headache, sinus pain and pressure, cough, congestion, and bilateral ear pain with tinnitus for about 2-3 days.  She's tried over-the-counter Mucinex improvement.  She had a similar illness last month treated with Omnicef, she had a yeast infection after this.  She's tolerating food and fluids like usual. No shortness of breath. No chest pain. No overt fevers.  + malaise  PMH: Smoking status noted ROS: Per HPI  Objective: BP (!) 157/92   Pulse 76   Temp 97.1 F (36.2 C) (Oral)   Ht 5\' 6"  (1.676 m)   Wt 200 lb 3.2 oz (90.8 kg)   BMI 32.31 kg/m  Gen: NAD, alert, cooperative with exam HEENT: NCAT, EOMI, PERRL CV: RRR, good S1/S2, no murmur Resp: CTABL, no wheezes, non-labored Ext: No edema, warm Neuro: Alert and oriented, No gross deficits  Assessment and plan:  # acute frontal sinusitis Treat with doxycycline, she had a course of Omnicef last month. RTC with any concerns 3 days out of wrork   Meds ordered this encounter  Medications  . doxycycline (VIBRA-TABS) 100 MG tablet    Sig: Take 1 tablet (100 mg total) by mouth 2 (two) times daily. 1 po bid    Dispense:  20 tablet    Refill:  0    Laroy Apple, MD [facility] 10/14/2016, 10:34 AM

## 2016-10-15 ENCOUNTER — Ambulatory Visit: Payer: 59 | Admitting: Physician Assistant

## 2016-10-15 ENCOUNTER — Other Ambulatory Visit: Payer: Self-pay | Admitting: Physician Assistant

## 2016-10-15 DIAGNOSIS — B379 Candidiasis, unspecified: Secondary | ICD-10-CM

## 2016-10-17 ENCOUNTER — Encounter: Payer: Self-pay | Admitting: Physician Assistant

## 2016-10-17 ENCOUNTER — Other Ambulatory Visit: Payer: Self-pay | Admitting: Physician Assistant

## 2016-10-17 ENCOUNTER — Ambulatory Visit (INDEPENDENT_AMBULATORY_CARE_PROVIDER_SITE_OTHER): Payer: 59 | Admitting: Physician Assistant

## 2016-10-17 VITALS — BP 132/83 | HR 85 | Temp 97.4°F | Ht 66.0 in | Wt 201.0 lb

## 2016-10-17 DIAGNOSIS — J0111 Acute recurrent frontal sinusitis: Secondary | ICD-10-CM | POA: Diagnosis not present

## 2016-10-17 DIAGNOSIS — H9313 Tinnitus, bilateral: Secondary | ICD-10-CM | POA: Diagnosis not present

## 2016-10-17 DIAGNOSIS — R42 Dizziness and giddiness: Secondary | ICD-10-CM | POA: Diagnosis not present

## 2016-10-17 MED ORDER — METHYLPREDNISOLONE ACETATE 80 MG/ML IJ SUSP: 80.0000 mg | Freq: Once | INTRAMUSCULAR | Status: DC

## 2016-10-17 MED ORDER — MECLIZINE HCL 25 MG PO TABS: 25.0000 mg | ORAL_TABLET | Freq: Three times a day (TID) | ORAL | 0 refills | Status: DC | PRN

## 2016-10-17 NOTE — Progress Notes (Signed)
BP 132/83   Pulse 85   Temp 97.4 F (36.3 C) (Oral)   Ht 5\' 6"  (1.676 m)   Wt 201 lb (91.2 kg)   BMI 32.44 kg/m    Subjective:    Patient ID: Deanna [name], female    DOB: 01-Dec-1960, 56 y.o.   MRN: [id number]  HPI: Deanna [name] is a 56 y.o. female presenting on 10/17/2016 for Sinus Problem and Ear Pain This patient has had many days of sinus headache and postnasal drainage. There is copious drainage at times. Denies any fever at this time. There has been a history of sinus infections in the past.  No history of sinus surgery. There is cough at night. It has become more prevalent in recent days.  Years of ear ringing sounds and intermittent vertigo. More than 4 years ago she was seen by Dr. Redmond Pulling ENT in [facility]. A full workup for her sinuses was completed and there was nothing found on any of the scans. He had discussed with her again the need for possible neurology evaluation. The patient is interested in going to the neurologist now. We will set this up.   Relevant past medical, surgical, family and social history reviewed and updated as indicated. Allergies and medications reviewed and updated.  Past Medical History:  Diagnosis Date  . Allergy   . Anxiety   . Asthma   . Cancer ([place])    breast  . Depression   . Diverticulosis of sigmoid colon 07/14/2011  . GERD (gastroesophageal reflux disease)   . Hypertension   . Migraine   . PONV (postoperative nausea and vomiting)     Past Surgical History:  Procedure Laterality Date  . ABDOMINAL HYSTERECTOMY  02/2010   partial  . BREAST REDUCTION SURGERY  02/2011  . BREAST SURGERY     cancer  . CHOLECYSTECTOMY  07/17/11   w/IOC  . NASAL SINUS SURGERY  02/2011  . TONSILLECTOMY  age 51    Review of Systems  Allergies as of 10/17/2016      Reactions   Iohexol Anaphylaxis   IBD dye      Medication List       Accurate as of 10/17/16 12:43 PM. Always use your most recent med list.          ALPRAZolam 1  MG tablet Commonly known as:  XANAX TAKE (1) TABLET DAILY AT BEDTIME.   buPROPion 150 MG 24 hr tablet Commonly known as:  WELLBUTRIN XL Take 3 tablets (450 mg total) by mouth daily.   diphenoxylate-atropine 2.5-0.025 MG tablet Commonly known as:  LOMOTIL Take 1 tablet by mouth 4 (four) times daily as needed. DIARRHEA   doxycycline 100 MG tablet Commonly known as:  VIBRA-TABS Take 1 tablet (100 mg total) by mouth 2 (two) times daily. 1 po bid   fluconazole 150 MG tablet Commonly known as:  DIFLUCAN TAKE 1 TABLET ONCE DAILY.   Fluticasone-Salmeterol 250-50 MCG/DOSE Aepb Commonly known as:  ADVAIR DISKUS Inhale 1 puff into the lungs 2 (two) times daily.   furosemide 20 MG tablet Commonly known as:  LASIX Take 1 tablet (20 mg total) by mouth daily.   meclizine 25 MG tablet Commonly known as:  ANTIVERT Take 1 tablet (25 mg total) by mouth 3 (three) times daily as needed for dizziness.   montelukast 10 MG tablet Commonly known as:  SINGULAIR Take 10 mg by mouth daily.   olmesartan 40 MG tablet Commonly known as:  [employer]  Take 1 tablet (40 mg total) by mouth every morning.   oxyCODONE 5 MG immediate release tablet Commonly known as:  Oxy IR/ROXICODONE as needed.   phentermine 37.5 MG capsule Take 1 capsule (37.5 mg total) by mouth every morning.   SUMAtriptan 100 MG tablet Commonly known as:  IMITREX TAKE 1 TABLET ONCE DAILY AS NEEDED FOR MIGRAINE AS DIRECTED BY PHYSICIAN.   valACYclovir 1000 MG tablet Commonly known as:  VALTREX TAKE  (1)  TABLET  EVERY TWELVE HOURS.   VENTOLIN HFA 108 (90 Base) MCG/ACT inhaler Generic drug:  albuterol Inhale 2 puffs into the lungs every 6 (six) hours as needed for wheezing or shortness of breath.          Objective:    BP 132/83   Pulse 85   Temp 97.4 F (36.3 C) (Oral)   Ht 5\' 6"  (1.676 m)   Wt 201 lb (91.2 kg)   BMI 32.44 kg/m   Allergies  Allergen Reactions  . Iohexol Anaphylaxis    IBD dye    Physical Exam   Constitutional: She is oriented to person, place, and time. She appears well-developed and well-nourished.  HENT:  Head: Normocephalic and atraumatic.  Right Ear: Tympanic membrane and external ear normal. No middle ear effusion.  Left Ear: Tympanic membrane and external ear normal.  No middle ear effusion.  Nose: Mucosal edema and rhinorrhea present. Right sinus exhibits no maxillary sinus tenderness. Left sinus exhibits no maxillary sinus tenderness.  Mouth/Throat: Uvula is midline. Posterior oropharyngeal erythema present.  Eyes: Conjunctivae and EOM are normal. Pupils are equal, round, and reactive to light. Right eye exhibits no discharge. Left eye exhibits no discharge.  Neck: Normal range of motion.  Cardiovascular: Normal rate, regular rhythm and normal heart sounds.   Pulmonary/Chest: Effort normal and breath sounds normal. No respiratory distress. She has no wheezes.  Abdominal: Soft.  Lymphadenopathy:    She has no cervical adenopathy.  Neurological: She is alert and oriented to person, place, and time.  Skin: Skin is warm and dry.  Psychiatric: She has a normal mood and affect.  Nursing note and vitals reviewed.       Assessment & Plan:   1. Acute recurrent frontal sinusitis - methylPREDNISolone acetate (DEPO-MEDROL) injection 80 mg; Inject 1 mL (80 mg total) into the muscle once.  2. Tinnitus of both ears - Ambulatory referral to Neurology  3. Vertigo - meclizine (ANTIVERT) 25 MG tablet; Take 1 tablet (25 mg total) by mouth 3 (three) times daily as needed for dizziness.  Dispense: 60 tablet; Refill: 0 - Ambulatory referral to Neurology    Current Outpatient Prescriptions:  .  ALPRAZolam (XANAX) 1 MG tablet, TAKE (1) TABLET DAILY AT BEDTIME., Disp: 30 tablet, Rfl: 0 .  buPROPion (WELLBUTRIN XL) 150 MG 24 hr tablet, Take 3 tablets (450 mg total) by mouth daily., Disp: 90 tablet, Rfl: 11 .  diphenoxylate-atropine (LOMOTIL) 2.5-0.025 MG per tablet, Take 1 tablet by  mouth 4 (four) times daily as needed. DIARRHEA , Disp: , Rfl:  .  doxycycline (VIBRA-TABS) 100 MG tablet, Take 1 tablet (100 mg total) by mouth 2 (two) times daily. 1 po bid, Disp: 20 tablet, Rfl: 0 .  fluconazole (DIFLUCAN) 150 MG tablet, TAKE 1 TABLET ONCE DAILY., Disp: 10 tablet, Rfl: 0 .  Fluticasone-Salmeterol (ADVAIR DISKUS) 250-50 MCG/DOSE AEPB, Inhale 1 puff into the lungs 2 (two) times daily., Disp: 1 each, Rfl: 5 .  furosemide (LASIX) 20 MG tablet, Take 1 tablet (20  mg total) by mouth daily., Disp: 90 tablet, Rfl: 3 .  montelukast (SINGULAIR) 10 MG tablet, Take 10 mg by mouth daily. , Disp: , Rfl:  .  olmesartan (BENICAR) 40 MG tablet, Take 1 tablet (40 mg total) by mouth every morning., Disp: 30 tablet, Rfl: 11 .  oxyCODONE (OXY IR/ROXICODONE) 5 MG immediate release tablet, as needed., Disp: , Rfl:  .  phentermine 37.5 MG capsule, Take 1 capsule (37.5 mg total) by mouth every morning., Disp: 30 capsule, Rfl: 3 .  SUMAtriptan (IMITREX) 100 MG tablet, TAKE 1 TABLET ONCE DAILY AS NEEDED FOR MIGRAINE AS DIRECTED BY PHYSICIAN., Disp: 30 tablet, Rfl: 11 .  valACYclovir (VALTREX) 1000 MG tablet, TAKE  (1)  TABLET  EVERY TWELVE HOURS., Disp: 20 tablet, Rfl: 0 .  VENTOLIN HFA 108 (90 Base) MCG/ACT inhaler, Inhale 2 puffs into the lungs every 6 (six) hours as needed for wheezing or shortness of breath., Disp: 1 Inhaler, Rfl: 11 .  meclizine (ANTIVERT) 25 MG tablet, Take 1 tablet (25 mg total) by mouth 3 (three) times daily as needed for dizziness., Disp: 60 tablet, Rfl: 0  Current Facility-Administered Medications:  .  methylPREDNISolone acetate (DEPO-MEDROL) injection 80 mg, 80 mg, Intramuscular, Once, Willard Madrigal S Obdulio Mash, PA-C .  methylPREDNISolone acetate (DEPO-MEDROL) injection 80 mg, 80 mg, Intramuscular, Once, Terald Sleeper, PA-C  Continue all other maintenance medications as listed above.  Follow up plan: Return if symptoms worsen or fail to improve.  Educational handout given for  sinusitis  Terald Sleeper PA-C [facility] [address]  [place], [place] [address] [phone]   10/17/2016, 12:43 PM

## 2016-10-17 NOTE — Patient Instructions (Signed)

## 2016-10-27 DIAGNOSIS — Z0289 Encounter for other administrative examinations: Secondary | ICD-10-CM

## 2016-11-17 ENCOUNTER — Other Ambulatory Visit: Payer: Self-pay | Admitting: Family Medicine

## 2016-11-18 NOTE — Telephone Encounter (Signed)
Pt not sure why this was still sent by the pharmacy

## 2016-11-21 ENCOUNTER — Encounter: Payer: Self-pay | Admitting: Physician Assistant

## 2016-11-21 ENCOUNTER — Ambulatory Visit (INDEPENDENT_AMBULATORY_CARE_PROVIDER_SITE_OTHER): Payer: 59 | Admitting: Physician Assistant

## 2016-11-21 VITALS — BP 131/79 | HR 74 | Temp 97.2°F | Ht 66.0 in | Wt 199.2 lb

## 2016-11-21 DIAGNOSIS — M25562 Pain in left knee: Secondary | ICD-10-CM

## 2016-11-21 DIAGNOSIS — F988 Other specified behavioral and emotional disorders with onset usually occurring in childhood and adolescence: Secondary | ICD-10-CM

## 2016-11-21 DIAGNOSIS — M25561 Pain in right knee: Secondary | ICD-10-CM

## 2016-11-21 MED ORDER — AMPHETAMINE-DEXTROAMPHET ER 20 MG PO CP24: 20.0000 mg | ORAL_CAPSULE | ORAL | 0 refills | Status: DC

## 2016-11-21 MED ORDER — METHYLPREDNISOLONE ACETATE 80 MG/ML IJ SUSP
80.0000 mg | Freq: Once | INTRAMUSCULAR | Status: AC
  Administered 2016-11-21: 80 mg via INTRAMUSCULAR

## 2016-11-21 NOTE — Progress Notes (Signed)
BP 131/79   Pulse 74   Temp 97.2 F (36.2 C) (Oral)   Ht 5\' 6"  (1.676 m)   Wt 199 lb 3.2 oz (90.4 kg)   BMI 32.15 kg/m    Subjective:    Patient ID: [name], female    DOB: 11-20-1960, 56 y.o.   MRN: [id number]  HPI: Deanna [name] is a 55 y.o. female presenting on 11/21/2016 for Knee Pain (left knee pain- stepped in whole and hurt it )  Knee pain: One week ago patient twisted knee walking 3 yard and did not feel a pop or click. She did not fall down. She had significant pain on the medial portion of the left knee. Throughout the week it has continued to hurt and swell significantly. She is to work a lot of her job. It has felt some better today but she is not going to work today. There is pain along the medial portion.  ADHD: Patient would like to get back on her ADHD medications. She has been off of them for about a year. There were some insurance coverage issues in the past. We will look for a generic that should be covered on her insurance. We will start his back and recheck her in one month since it is a different medication.  Relevant past medical, surgical, family and social history reviewed and updated as indicated. Allergies and medications reviewed and updated.  Past Medical History:  Diagnosis Date  . Allergy   . Anxiety   . Asthma   . Cancer ([place])    breast  . Depression   . Diverticulosis of sigmoid colon 07/14/2011  . GERD (gastroesophageal reflux disease)   . Hypertension   . Migraine   . PONV (postoperative nausea and vomiting)     Past Surgical History:  Procedure Laterality Date  . ABDOMINAL HYSTERECTOMY  02/2010   partial  . BREAST REDUCTION SURGERY  02/2011  . BREAST SURGERY     cancer  . CHOLECYSTECTOMY  07/17/11   w/IOC  . NASAL SINUS SURGERY  02/2011  . TONSILLECTOMY  age 67    Review of Systems  Constitutional: Negative.  Negative for activity change, fatigue and fever.  HENT: Negative.   Eyes: Negative.   Respiratory: Negative.   Negative for cough.   Cardiovascular: Negative.  Negative for chest pain.  Gastrointestinal: Negative.  Negative for abdominal pain.  Endocrine: Negative.   Genitourinary: Negative.  Negative for dysuria.  Musculoskeletal: Positive for arthralgias and joint swelling.  Skin: Negative.   Neurological: Negative.   Psychiatric/Behavioral: Positive for decreased concentration. Negative for sleep disturbance. The patient is not nervous/anxious and is not hyperactive.     Allergies as of 11/21/2016      Reactions   Iohexol Anaphylaxis   IBD dye      Medication List       Accurate as of 11/21/16  8:51 AM. Always use your most recent med list.          ALPRAZolam 1 MG tablet Commonly known as:  XANAX TAKE (1) TABLET DAILY AT BEDTIME.   amphetamine-dextroamphetamine 20 MG 24 hr capsule Commonly known as:  ADDERALL XR Take 1 capsule (20 mg total) by mouth every morning.   buPROPion 150 MG 24 hr tablet Commonly known as:  WELLBUTRIN XL Take 3 tablets (450 mg total) by mouth daily.   diphenoxylate-atropine 2.5-0.025 MG tablet Commonly known as:  LOMOTIL Take 1 tablet by mouth 4 (four) times daily as needed.  DIARRHEA   fluconazole 150 MG tablet Commonly known as:  DIFLUCAN TAKE 1 TABLET ONCE DAILY.   Fluticasone-Salmeterol 250-50 MCG/DOSE Aepb Commonly known as:  ADVAIR DISKUS Inhale 1 puff into the lungs 2 (two) times daily.   furosemide 20 MG tablet Commonly known as:  LASIX Take 1 tablet (20 mg total) by mouth daily.   meclizine 25 MG tablet Commonly known as:  ANTIVERT Take 1 tablet (25 mg total) by mouth 3 (three) times daily as needed for dizziness.   montelukast 10 MG tablet Commonly known as:  SINGULAIR TAKE 1 TABLET ONCE DAILY IN THE EVENING.   olmesartan 40 MG tablet Commonly known as:  BENICAR Take 1 tablet (40 mg total) by mouth every morning.   oxyCODONE 5 MG immediate release tablet Commonly known as:  Oxy IR/ROXICODONE as needed.   phentermine 37.5  MG capsule Take 1 capsule (37.5 mg total) by mouth every morning.   SUMAtriptan 100 MG tablet Commonly known as:  IMITREX TAKE 1 TABLET ONCE DAILY AS NEEDED FOR MIGRAINE AS DIRECTED BY PHYSICIAN.   valACYclovir 1000 MG tablet Commonly known as:  VALTREX TAKE  (1)  TABLET  EVERY TWELVE HOURS.   VENTOLIN HFA 108 (90 Base) MCG/ACT inhaler Generic drug:  albuterol Inhale 2 puffs into the lungs every 6 (six) hours as needed for wheezing or shortness of breath.          Objective:    BP 131/79   Pulse 74   Temp 97.2 F (36.2 C) (Oral)   Ht 5\' 6"  (1.676 m)   Wt 199 lb 3.2 oz (90.4 kg)   BMI 32.15 kg/m   Allergies  Allergen Reactions  . Iohexol Anaphylaxis    IBD dye    Physical Exam  Constitutional: She is oriented to person, place, and time. She appears well-developed and well-nourished.  HENT:  Head: Normocephalic and atraumatic.  Right Ear: Tympanic membrane, external ear and ear canal normal.  Left Ear: Tympanic membrane, external ear and ear canal normal.  Nose: Nose normal. No rhinorrhea.  Mouth/Throat: Oropharynx is clear and moist and mucous membranes are normal. No oropharyngeal exudate or posterior oropharyngeal erythema.  Eyes: Conjunctivae and EOM are normal. Pupils are equal, round, and reactive to light.  Neck: Normal range of motion. Neck supple.  Cardiovascular: Normal rate, regular rhythm, normal heart sounds and intact distal pulses.   Pulmonary/Chest: Effort normal and breath sounds normal.  Abdominal: Soft. Bowel sounds are normal.  Musculoskeletal:       Left knee: She exhibits swelling. She exhibits normal range of motion, no deformity, no erythema and normal alignment. Tenderness found. Medial joint line tenderness noted.       Legs: Neurological: She is alert and oriented to person, place, and time. She has normal reflexes.  Skin: Skin is warm and dry. No rash noted.  Psychiatric: She has a normal mood and affect. Her behavior is normal. Judgment  and thought content normal.  Nursing note and vitals reviewed.       Assessment & Plan:   1. Acute pain of left knee - Ambulatory referral to Orthopedic Surgery - methylPREDNISolone acetate (DEPO-MEDROL) injection 80 mg; Inject 1 mL (80 mg total) into the muscle once.  2. Medial knee pain, right - methylPREDNISolone acetate (DEPO-MEDROL) injection 80 mg; Inject 1 mL (80 mg total) into the muscle once.  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  Dispense: 30 capsule; Refill: 0   Current Outpatient Prescriptions:  .  ALPRAZolam (XANAX) 1 MG tablet, TAKE (1) TABLET DAILY AT BEDTIME., Disp: 30 tablet, Rfl: 0 .  amphetamine-dextroamphetamine (ADDERALL XR) 20 MG 24 hr capsule, Take 1 capsule (20 mg total) by mouth every morning., Disp: 30 capsule, Rfl: 0 .  buPROPion (WELLBUTRIN XL) 150 MG 24 hr tablet, Take 3 tablets (450 mg total) by mouth daily., Disp: 90 tablet, Rfl: 11 .  diphenoxylate-atropine (LOMOTIL) 2.5-0.025 MG per tablet, Take 1 tablet by mouth 4 (four) times daily as needed. DIARRHEA , Disp: , Rfl:  .  fluconazole (DIFLUCAN) 150 MG tablet, TAKE 1 TABLET ONCE DAILY., Disp: 10 tablet, Rfl: 0 .  Fluticasone-Salmeterol (ADVAIR DISKUS) 250-50 MCG/DOSE AEPB, Inhale 1 puff into the lungs 2 (two) times daily., Disp: 1 each, Rfl: 5 .  furosemide (LASIX) 20 MG tablet, Take 1 tablet (20 mg total) by mouth daily., Disp: 90 tablet, Rfl: 3 .  meclizine (ANTIVERT) 25 MG tablet, Take 1 tablet (25 mg total) by mouth 3 (three) times daily as needed for dizziness., Disp: 60 tablet, Rfl: 0 .  montelukast (SINGULAIR) 10 MG tablet, TAKE 1 TABLET ONCE DAILY IN THE EVENING., Disp: 30 tablet, Rfl: 10 .  olmesartan (BENICAR) 40 MG tablet, Take 1 tablet (40 mg total) by mouth every morning., Disp: 30 tablet, Rfl: 11 .  oxyCODONE (OXY IR/ROXICODONE) 5 MG immediate release  tablet, as needed., Disp: , Rfl:  .  phentermine 37.5 MG capsule, Take 1 capsule (37.5 mg total) by mouth every morning., Disp: 30 capsule, Rfl: 3 .  SUMAtriptan (IMITREX) 100 MG tablet, TAKE 1 TABLET ONCE DAILY AS NEEDED FOR MIGRAINE AS DIRECTED BY PHYSICIAN., Disp: 30 tablet, Rfl: 11 .  valACYclovir (VALTREX) 1000 MG tablet, TAKE  (1)  TABLET  EVERY TWELVE HOURS., Disp: 20 tablet, Rfl: 0 .  VENTOLIN HFA 108 (90 Base) MCG/ACT inhaler, Inhale 2 puffs into the lungs every 6 (six) hours as needed for wheezing or shortness of breath., Disp: 1 Inhaler, Rfl: 11  Current Facility-Administered Medications:  .  methylPREDNISolone acetate (DEPO-MEDROL) injection 80 mg, 80 mg, Intramuscular, Once, Ronit Marczak S Sinclair All[place]ood, PA-C .  methylPREDNISolone acetate (DEPO-MEDROL) injection 80 mg, 80 mg, Intramuscular, Once, Bentli Llorente S Kmya Placide, PA-C .  methylPREDNISolone acetate (DEPO-MEDROL) injection 80 mg, 80 mg, Intramuscular, Once, Terald Sleeper, PA-C  Continue all other maintenance medications as listed above.  Follow up plan: Return in about 4 weeks (around 12/19/2016) for recheck meds.  Educational handout given for ADD  Terald Sleeper PA-C [facility] [address]  [place], [place] [address] [phone]   11/21/2016, 8:51 AM

## 2016-11-21 NOTE — Patient Instructions (Signed)

## 2016-11-24 ENCOUNTER — Telehealth: Payer: Self-pay | Admitting: Physician Assistant

## 2016-11-24 NOTE — Telephone Encounter (Signed)
yes

## 2016-11-24 NOTE — Telephone Encounter (Signed)
Patient aware that placard is up front to be picked up.

## 2016-11-27 ENCOUNTER — Ambulatory Visit: Payer: 59 | Admitting: Neurology

## 2016-11-28 DIAGNOSIS — Z029 Encounter for administrative examinations, unspecified: Secondary | ICD-10-CM

## 2016-12-15 ENCOUNTER — Ambulatory Visit: Payer: 59 | Admitting: Neurology

## 2016-12-16 ENCOUNTER — Encounter: Payer: Self-pay | Admitting: Neurology

## 2016-12-19 ENCOUNTER — Encounter: Payer: Self-pay | Admitting: Physician Assistant

## 2016-12-19 ENCOUNTER — Ambulatory Visit (INDEPENDENT_AMBULATORY_CARE_PROVIDER_SITE_OTHER): Payer: 59 | Admitting: Physician Assistant

## 2016-12-19 DIAGNOSIS — F988 Other specified behavioral and emotional disorders with onset usually occurring in childhood and adolescence: Secondary | ICD-10-CM | POA: Insufficient documentation

## 2016-12-19 MED ORDER — AMPHETAMINE-DEXTROAMPHET ER 30 MG PO CP24: 30.0000 mg | ORAL_CAPSULE | ORAL | 0 refills | Status: DC

## 2016-12-19 MED ORDER — AMPHETAMINE-DEXTROAMPHET ER 30 MG PO CP24: 30.0000 mg | ORAL_CAPSULE | Freq: Every day | ORAL | 0 refills | Status: DC

## 2016-12-19 NOTE — Progress Notes (Signed)
BP 134/87   Pulse 79   Temp 97 F (36.1 C) (Oral)   Ht 5\' 6"  (1.676 m)   Wt 200 lb 6.4 oz (90.9 kg)   BMI 32.35 kg/m    Subjective:    Patient ID: Deanna [name], female    DOB: 1961-04-02, 56 y.o.   MRN: [id number]  HPI: Deanna [name] is a 56 y.o. female presenting on 12/19/2016 for Follow-up (Medication recheck)  This patient comes in for periodic recheck on medications and conditions including ADD.  Reports some improvement with the Adderall, tolerating it well. Denies any issues with the medications,   All medications are reviewed today. There are no reports of any problems with the medications. All of the medical conditions are reviewed and updated.  Lab work is reviewed and will be ordered as medically necessary. There are no new problems reported with today's visit.   Relevant past medical, surgical, family and social history reviewed and updated as indicated. Allergies and medications reviewed and updated.  Past Medical History:  Diagnosis Date  . Allergy   . Anxiety   . Asthma   . Cancer ([facility])    breast  . Depression   . Diverticulosis of sigmoid colon 07/14/2011  . GERD (gastroesophageal reflux disease)   . Hypertension   . Migraine   . PONV (postoperative nausea and vomiting)     Past Surgical History:  Procedure Laterality Date  . ABDOMINAL HYSTERECTOMY  02/2010   partial  . BREAST REDUCTION SURGERY  02/2011  . BREAST SURGERY     cancer  . CHOLECYSTECTOMY  07/17/11   w/IOC  . NASAL SINUS SURGERY  02/2011  . TONSILLECTOMY  age 50    Review of Systems  Constitutional: Negative.  Negative for activity change, fatigue and fever.  HENT: Negative.   Eyes: Negative.   Respiratory: Negative.  Negative for cough.   Cardiovascular: Negative.  Negative for chest pain.  Gastrointestinal: Negative.  Negative for abdominal pain.  Endocrine: Negative.   Genitourinary: Negative.  Negative for dysuria.  Musculoskeletal: Negative.   Skin: Negative.     Neurological: Negative.     Allergies as of 12/19/2016      Reactions   Iohexol Anaphylaxis   IBD dye      Medication List       Accurate as of 12/19/16 10:09 PM. Always use your most recent med list.          ALPRAZolam 1 MG tablet Commonly known as:  XANAX TAKE (1) TABLET DAILY AT BEDTIME.   amphetamine-dextroamphetamine 30 MG 24 hr capsule Commonly known as:  ADDERALL XR Take 1 capsule (30 mg total) by mouth every morning.   amphetamine-dextroamphetamine 30 MG 24 hr capsule Commonly known as:  ADDERALL XR Take 1 capsule (30 mg total) by mouth daily.   amphetamine-dextroamphetamine 30 MG 24 hr capsule Commonly known as:  ADDERALL XR Take 1 capsule (30 mg total) by mouth daily.   buPROPion 150 MG 24 hr tablet Commonly known as:  WELLBUTRIN XL Take 3 tablets (450 mg total) by mouth daily.   diphenoxylate-atropine 2.5-0.025 MG tablet Commonly known as:  LOMOTIL Take 1 tablet by mouth 4 (four) times daily as needed. DIARRHEA   fluconazole 150 MG tablet Commonly known as:  DIFLUCAN TAKE 1 TABLET ONCE DAILY.   Fluticasone-Salmeterol 250-50 MCG/DOSE Aepb Commonly known as:  ADVAIR DISKUS Inhale 1 puff into the lungs 2 (two) times daily.   furosemide 20 MG tablet Commonly known as:  LASIX Take 1 tablet (20 mg total) by mouth daily.   meclizine 25 MG tablet Commonly known as:  ANTIVERT Take 1 tablet (25 mg total) by mouth 3 (three) times daily as needed for dizziness.   montelukast 10 MG tablet Commonly known as:  SINGULAIR TAKE 1 TABLET ONCE DAILY IN THE EVENING.   olmesartan 40 MG tablet Commonly known as:  BENICAR Take 1 tablet (40 mg total) by mouth every morning.   SUMAtriptan 100 MG tablet Commonly known as:  IMITREX TAKE 1 TABLET ONCE DAILY AS NEEDED FOR MIGRAINE AS DIRECTED BY PHYSICIAN.   valACYclovir 1000 MG tablet Commonly known as:  VALTREX TAKE  (1)  TABLET  EVERY TWELVE HOURS.   VENTOLIN HFA 108 (90 Base) MCG/ACT inhaler Generic drug:   albuterol Inhale 2 puffs into the lungs every 6 (six) hours as needed for wheezing or shortness of breath.          Objective:    BP 134/87   Pulse 79   Temp 97 F (36.1 C) (Oral)   Ht 5\' 6"  (1.676 m)   Wt 200 lb 6.4 oz (90.9 kg)   BMI 32.35 kg/m   Allergies  Allergen Reactions  . Iohexol Anaphylaxis    IBD dye    Physical Exam  Constitutional: She is oriented to person, place, and time. She appears well-developed and well-nourished.  HENT:  Head: Normocephalic and atraumatic.  Right Ear: Tympanic membrane, external ear and ear canal normal.  Left Ear: Tympanic membrane, external ear and ear canal normal.  Nose: Nose normal. No rhinorrhea.  Mouth/Throat: Oropharynx is clear and moist and mucous membranes are normal. No oropharyngeal exudate or posterior oropharyngeal erythema.  Eyes: Conjunctivae and EOM are normal. Pupils are equal, round, and reactive to light.  Neck: Normal range of motion. Neck supple.  Cardiovascular: Normal rate, regular rhythm, normal heart sounds and intact distal pulses.   Pulmonary/Chest: Effort normal and breath sounds normal.  Abdominal: Soft. Bowel sounds are normal.  Neurological: She is alert and oriented to person, place, and time. She has normal reflexes.  Skin: Skin is warm and dry. No rash noted.  Psychiatric: She has a normal mood and affect. Her behavior is normal. Judgment and thought content normal.  Nursing note and vitals reviewed.      Assessment & Plan:   1. Attention deficit disorder (ADD) without hyperactivity - amphetamine-dextroamphetamine (ADDERALL XR) 30 MG 24 hr capsule; Take 1 capsule (30 mg total) by mouth every morning.  Dispense: 30 capsule; Refill: 0 - amphetamine-dextroamphetamine (ADDERALL XR) 30 MG 24 hr capsule; Take 1 capsule (30 mg total) by mouth daily.  Dispense: 30 capsule; Refill: 0 - amphetamine-dextroamphetamine (ADDERALL XR) 30 MG 24 hr capsule; Take 1 capsule (30 mg total) by mouth daily.  Dispense:  30 capsule; Refill: 0   Current Outpatient Prescriptions:  .  ALPRAZolam (XANAX) 1 MG tablet, TAKE (1) TABLET DAILY AT BEDTIME., Disp: 30 tablet, Rfl: 0 .  amphetamine-dextroamphetamine (ADDERALL XR) 30 MG 24 hr capsule, Take 1 capsule (30 mg total) by mouth every morning., Disp: 30 capsule, Rfl: 0 .  buPROPion (WELLBUTRIN XL) 150 MG 24 hr tablet, Take 3 tablets (450 mg total) by mouth daily., Disp: 90 tablet, Rfl: 11 .  diphenoxylate-atropine (LOMOTIL) 2.5-0.025 MG per tablet, Take 1 tablet by mouth 4 (four) times daily as needed. DIARRHEA , Disp: , Rfl:  .  Fluticasone-Salmeterol (ADVAIR DISKUS) 250-50 MCG/DOSE AEPB, Inhale 1 puff into the lungs 2 (two) times daily., Disp:  1 each, Rfl: 5 .  furosemide (LASIX) 20 MG tablet, Take 1 tablet (20 mg total) by mouth daily., Disp: 90 tablet, Rfl: 3 .  meclizine (ANTIVERT) 25 MG tablet, Take 1 tablet (25 mg total) by mouth 3 (three) times daily as needed for dizziness., Disp: 60 tablet, Rfl: 0 .  montelukast (SINGULAIR) 10 MG tablet, TAKE 1 TABLET ONCE DAILY IN THE EVENING., Disp: 30 tablet, Rfl: 10 .  olmesartan (BENICAR) 40 MG tablet, Take 1 tablet (40 mg total) by mouth every morning., Disp: 30 tablet, Rfl: 11 .  SUMAtriptan (IMITREX) 100 MG tablet, TAKE 1 TABLET ONCE DAILY AS NEEDED FOR MIGRAINE AS DIRECTED BY PHYSICIAN., Disp: 30 tablet, Rfl: 11 .  valACYclovir (VALTREX) 1000 MG tablet, TAKE  (1)  TABLET  EVERY TWELVE HOURS., Disp: 20 tablet, Rfl: 0 .  VENTOLIN HFA 108 (90 Base) MCG/ACT inhaler, Inhale 2 puffs into the lungs every 6 (six) hours as needed for wheezing or shortness of breath., Disp: 1 Inhaler, Rfl: 11 .  amphetamine-dextroamphetamine (ADDERALL XR) 30 MG 24 hr capsule, Take 1 capsule (30 mg total) by mouth daily., Disp: 30 capsule, Rfl: 0 .  amphetamine-dextroamphetamine (ADDERALL XR) 30 MG 24 hr capsule, Take 1 capsule (30 mg total) by mouth daily., Disp: 30 capsule, Rfl: 0 .  fluconazole (DIFLUCAN) 150 MG tablet, TAKE 1 TABLET ONCE  DAILY., Disp: 10 tablet, Rfl: 0  Continue all other maintenance medications as listed above.  Follow up plan: Return in about 3 months (around 03/21/2017) for recheck.  Educational handout given for ADD  Terald Sleeper PA-C [facility] [address]  [place], [place] [address] [phone]   12/19/2016, 10:09 PM

## 2016-12-19 NOTE — Patient Instructions (Signed)
Breakfast: eggs 2-3 Or greek yogurt low fat DANNON 1 slice delightful [employer] bread  Lunch: 2 slice [employer] delightfully bread       Or Nature's Own Light 4 ounces chicken, turkey, roast beef 1 slice Thin sliced cheese Sargento Mustard ok 1 piece of fruit  Supper: 6 ounces lean meat 2 cups raw/cooked veg or 1 cup pintos, corn lima  3 snacks 100 calories or less  

## 2016-12-26 ENCOUNTER — Telehealth: Payer: Self-pay | Admitting: Physician Assistant

## 2016-12-30 ENCOUNTER — Ambulatory Visit (INDEPENDENT_AMBULATORY_CARE_PROVIDER_SITE_OTHER): Payer: 59 | Admitting: Physician Assistant

## 2016-12-30 ENCOUNTER — Encounter: Payer: Self-pay | Admitting: Physician Assistant

## 2016-12-30 VITALS — BP 138/90 | HR 79 | Temp 97.3°F | Ht 66.0 in | Wt 195.4 lb

## 2016-12-30 DIAGNOSIS — W57XXXA Bitten or stung by nonvenomous insect and other nonvenomous arthropods, initial encounter: Secondary | ICD-10-CM

## 2016-12-30 DIAGNOSIS — S00462A Insect bite (nonvenomous) of left ear, initial encounter: Secondary | ICD-10-CM

## 2016-12-30 MED ORDER — DOXYCYCLINE HYCLATE 100 MG PO TABS: 100.0000 mg | ORAL_TABLET | Freq: Two times a day (BID) | ORAL | 0 refills | Status: DC

## 2016-12-30 NOTE — Patient Instructions (Signed)
Lyme Disease Lyme disease is an infection that affects many parts of the body, including the skin, joints, and nervous system. It is a bacterial infection that starts from the bite of an infected tick. The infection can spread, and some of the symptoms are similar to the flu. If Lyme disease is not treated, it may cause joint pain, swelling, numbness, problems thinking, fatigue, muscle weakness, and other problems. What are the causes? This condition is caused by bacteria called Borrelia burgdorferi. You can get Lyme disease by being bitten by an infected tick. The tick must be attached to your skin to pass along the infection. Deer often carry infected ticks. What increases the risk? The following factors may make you more likely to develop this condition:  Living in or visiting these areas in the [place]  [place].  The [place].  The [place].  Spending time in wooded or grassy areas.  Being outdoors with exposed skin.  Camping, gardening, hiking, fishing, or hunting outdoors.  Failing to remove a tick from your skin within 3-4 days. What are the signs or symptoms? Symptoms of this condition include:  A round, red rash that surrounds the center of the tick bite. This is the first sign of infection. The center of the rash may be blood colored or have tiny blisters.  Fatigue.  Headache.  Chills and fever.  General achiness.  Joint pain, often in the knees.  Muscle pain.  Swollen lymph glands.  Stiff neck. How is this diagnosed? This condition is diagnosed based on:  Your symptoms and medical history.  A physical exam.  A blood test. How is this treated? The main treatment for this condition is antibiotic medicine, which is usually taken by mouth (orally). The length of treatment depends on how soon after a tick bite you begin taking the medicine. In some cases, treatment is necessary for several weeks. If the infection is severe, antibiotics may  need to be given through an IV tube that is inserted into one of your veins. Follow these instructions at home:  Take your antibiotic medicine as told by your health care provider. Do not stop taking the antibiotic even if you start to feel better.  Ask your health care provider about takinga probiotic in between doses of your antibiotic to help avoid stomach upset or diarrhea.  Check with your health care provider before supplementing your treatment. Many alternative therapies have not been proven and may be harmful to you.  Keep all follow-up visits as told by your health care provider. This is important. How is this prevented? You can become reinfected if you get another tick bite from an infected tick. Take these steps to help prevent an infection:  Cover your skin with light-colored clothing when you are outdoors in the spring and summer months.  Spray clothing and skin with bug spray. The spray should be 20-30% DEET.  Avoid wooded, grassy, and shaded areas.  Remove yard litter, brush, trash, and plants that attract deer and rodents.  Check yourself for ticks when you come indoors.  Wash clothing worn each day.  Check your pets for ticks before they come inside.  If you find a tick:  Remove it with tweezers.  Clean your hands and the bite area with rubbing alcohol or soap and water. Pregnant women should take special care to avoid tick bites because the infection can be passed along to the fetus. Contact a health care provider if:  You have symptoms after   treatment.  You have removed a tick and want to bring it to your health care provider for testing. Get help right away if:  You have an irregular heartbeat.  You have nerve pain.  Your face feels numb. This information is not intended to replace advice given to you by your health care provider. Make sure you discuss any questions you have with your health care provider. Document Released: 10/20/2000 Document  Revised: 03/04/2016 Document Reviewed: 03/04/2016 Elsevier Interactive Patient Education  2017 [employer] Inc.  

## 2016-12-30 NOTE — Progress Notes (Signed)
BP 138/90   Pulse 79   Temp 97.3 F (36.3 C) (Oral)   Ht 5\' 6"  (1.676 m)   Wt 195 lb 6.4 oz (88.6 kg)   BMI 31.54 kg/m    Subjective:    Patient ID: Deanna [name], female    DOB: 10-16-1960, 56 y.o.   MRN: [id number]  HPI: Deanna [name] is a 56 y.o. female presenting on 12/30/2016 for Tick Removal (left ear, yesterday morning, chills all morning, took benadryl)  Bite on right ear, woke up today with small tick on the left face. Was able to get it out. Swelling significant and pain redness around rash. Has 4 other bites this year.  Relevant past medical, surgical, family and social history reviewed and updated as indicated. Allergies and medications reviewed and updated.  Past Medical History:  Diagnosis Date  . Allergy   . Anxiety   . Asthma   . Cancer ([facility])    breast  . Depression   . Diverticulosis of sigmoid colon 07/14/2011  . GERD (gastroesophageal reflux disease)   . Hypertension   . Migraine   . PONV (postoperative nausea and vomiting)     Past Surgical History:  Procedure Laterality Date  . ABDOMINAL HYSTERECTOMY  02/2010   partial  . BREAST REDUCTION SURGERY  02/2011  . BREAST SURGERY     cancer  . CHOLECYSTECTOMY  07/17/11   w/IOC  . NASAL SINUS SURGERY  02/2011  . TONSILLECTOMY  age 15    Review of Systems  Constitutional: Negative.  Negative for activity change, fatigue and fever.  HENT: Negative.   Eyes: Negative.   Respiratory: Negative.  Negative for cough.   Cardiovascular: Negative.  Negative for chest pain.  Gastrointestinal: Negative.  Negative for abdominal pain.  Endocrine: Negative.   Genitourinary: Negative.  Negative for dysuria.  Musculoskeletal: Negative.   Skin: Negative.   Neurological: Negative.     Allergies as of 12/30/2016      Reactions   Iohexol Anaphylaxis   IBD dye      Medication List       Accurate as of 12/30/16  2:57 PM. Always use your most recent med list.          ALPRAZolam 1 MG tablet Commonly known  as:  XANAX TAKE (1) TABLET DAILY AT BEDTIME.   amphetamine-dextroamphetamine 30 MG 24 hr capsule Commonly known as:  ADDERALL XR Take 1 capsule (30 mg total) by mouth every morning.   amphetamine-dextroamphetamine 30 MG 24 hr capsule Commonly known as:  ADDERALL XR Take 1 capsule (30 mg total) by mouth daily.   amphetamine-dextroamphetamine 30 MG 24 hr capsule Commonly known as:  ADDERALL XR Take 1 capsule (30 mg total) by mouth daily.   buPROPion 150 MG 24 hr tablet Commonly known as:  WELLBUTRIN XL Take 3 tablets (450 mg total) by mouth daily.   diphenoxylate-atropine 2.5-0.025 MG tablet Commonly known as:  LOMOTIL Take 1 tablet by mouth 4 (four) times daily as needed. DIARRHEA   doxycycline 100 MG tablet Commonly known as:  VIBRA-TABS Take 1 tablet (100 mg total) by mouth 2 (two) times daily. 1 po bid   Fluticasone-Salmeterol 250-50 MCG/DOSE Aepb Commonly known as:  ADVAIR DISKUS Inhale 1 puff into the lungs 2 (two) times daily.   furosemide 20 MG tablet Commonly known as:  LASIX Take 1 tablet (20 mg total) by mouth daily.   meclizine 25 MG tablet Commonly known as:  ANTIVERT Take 1 tablet (25  mg total) by mouth 3 (three) times daily as needed for dizziness.   montelukast 10 MG tablet Commonly known as:  SINGULAIR TAKE 1 TABLET ONCE DAILY IN THE EVENING.   olmesartan 40 MG tablet Commonly known as:  BENICAR Take 1 tablet (40 mg total) by mouth every morning.   SUMAtriptan 100 MG tablet Commonly known as:  IMITREX TAKE 1 TABLET ONCE DAILY AS NEEDED FOR MIGRAINE AS DIRECTED BY PHYSICIAN.   valACYclovir 1000 MG tablet Commonly known as:  VALTREX TAKE  (1)  TABLET  EVERY TWELVE HOURS.   VENTOLIN HFA 108 (90 Base) MCG/ACT inhaler Generic drug:  albuterol Inhale 2 puffs into the lungs every 6 (six) hours as needed for wheezing or shortness of breath.          Objective:    BP 138/90   Pulse 79   Temp 97.3 F (36.3 C) (Oral)   Ht 5\' 6"  (1.676 m)   Wt  195 lb 6.4 oz (88.6 kg)   BMI 31.54 kg/m   Allergies  Allergen Reactions  . Iohexol Anaphylaxis    IBD dye    Physical Exam  Constitutional: She is oriented to person, place, and time. She appears well-developed and well-nourished.  HENT:  Head: Normocephalic and atraumatic.  Eyes: Conjunctivae and EOM are normal. Pupils are equal, round, and reactive to light.  Cardiovascular: Normal rate, regular rhythm, normal heart sounds and intact distal pulses.   Pulmonary/Chest: Effort normal and breath sounds normal.  Abdominal: Soft. Bowel sounds are normal.  Neurological: She is alert and oriented to person, place, and time. She has normal reflexes.  Skin: Skin is warm and dry. Lesion and rash noted. Rash is maculopapular. There is erythema.     Psychiatric: She has a normal mood and affect. Her behavior is normal. Judgment and thought content normal.  Nursing note and vitals reviewed.       Assessment & Plan:   1. Tick bite, initial encounter - doxycycline (VIBRA-TABS) 100 MG tablet; Take 1 tablet (100 mg total) by mouth 2 (two) times daily. 1 po bid  Dispense: 28 tablet; Refill: 0   Current Outpatient Prescriptions:  .  ALPRAZolam (XANAX) 1 MG tablet, TAKE (1) TABLET DAILY AT BEDTIME., Disp: 30 tablet, Rfl: 0 .  amphetamine-dextroamphetamine (ADDERALL XR) 30 MG 24 hr capsule, Take 1 capsule (30 mg total) by mouth every morning., Disp: 30 capsule, Rfl: 0 .  amphetamine-dextroamphetamine (ADDERALL XR) 30 MG 24 hr capsule, Take 1 capsule (30 mg total) by mouth daily., Disp: 30 capsule, Rfl: 0 .  amphetamine-dextroamphetamine (ADDERALL XR) 30 MG 24 hr capsule, Take 1 capsule (30 mg total) by mouth daily., Disp: 30 capsule, Rfl: 0 .  buPROPion (WELLBUTRIN XL) 150 MG 24 hr tablet, Take 3 tablets (450 mg total) by mouth daily., Disp: 90 tablet, Rfl: 11 .  diphenoxylate-atropine (LOMOTIL) 2.5-0.025 MG per tablet, Take 1 tablet by mouth 4 (four) times daily as needed. DIARRHEA , Disp: ,  Rfl:  .  Fluticasone-Salmeterol (ADVAIR DISKUS) 250-50 MCG/DOSE AEPB, Inhale 1 puff into the lungs 2 (two) times daily., Disp: 1 each, Rfl: 5 .  furosemide (LASIX) 20 MG tablet, Take 1 tablet (20 mg total) by mouth daily., Disp: 90 tablet, Rfl: 3 .  meclizine (ANTIVERT) 25 MG tablet, Take 1 tablet (25 mg total) by mouth 3 (three) times daily as needed for dizziness., Disp: 60 tablet, Rfl: 0 .  montelukast (SINGULAIR) 10 MG tablet, TAKE 1 TABLET ONCE DAILY IN THE EVENING., Disp:  30 tablet, Rfl: 10 .  olmesartan (BENICAR) 40 MG tablet, Take 1 tablet (40 mg total) by mouth every morning., Disp: 30 tablet, Rfl: 11 .  SUMAtriptan (IMITREX) 100 MG tablet, TAKE 1 TABLET ONCE DAILY AS NEEDED FOR MIGRAINE AS DIRECTED BY PHYSICIAN., Disp: 30 tablet, Rfl: 11 .  valACYclovir (VALTREX) 1000 MG tablet, TAKE  (1)  TABLET  EVERY TWELVE HOURS., Disp: 20 tablet, Rfl: 0 .  VENTOLIN HFA 108 (90 Base) MCG/ACT inhaler, Inhale 2 puffs into the lungs every 6 (six) hours as needed for wheezing or shortness of breath., Disp: 1 Inhaler, Rfl: 11 .  doxycycline (VIBRA-TABS) 100 MG tablet, Take 1 tablet (100 mg total) by mouth 2 (two) times daily. 1 po bid, Disp: 28 tablet, Rfl: 0  Continue all other maintenance medications as listed above.  Follow up plan: Return if symptoms worsen or fail to improve.  Educational handout given for tick bite  Terald Sleeper PA-C [facility] [address]  [place], [place] [address] [phone]   12/30/2016, 2:57 PM

## 2017-01-21 ENCOUNTER — Ambulatory Visit: Payer: 59 | Attending: Orthopedic Surgery | Admitting: Physical Therapy

## 2017-01-21 DIAGNOSIS — R600 Localized edema: Secondary | ICD-10-CM | POA: Insufficient documentation

## 2017-01-21 DIAGNOSIS — M25662 Stiffness of left knee, not elsewhere classified: Secondary | ICD-10-CM | POA: Insufficient documentation

## 2017-01-21 DIAGNOSIS — M25562 Pain in left knee: Secondary | ICD-10-CM | POA: Insufficient documentation

## 2017-01-21 DIAGNOSIS — M6281 Muscle weakness (generalized): Secondary | ICD-10-CM | POA: Insufficient documentation

## 2017-01-21 NOTE — Therapy (Signed)
[facility] [place], [place], [address] Phone: [phone]   Fax:  [phone]  Physical Therapy Evaluation  Patient Details  Name: Deanna [name] MRN: [id number] Date of Birth: 1961/04/14 Referring Provider: Victorino December MD  Encounter Date: 01/21/2017      PT End of Session - 01/21/17 1011    Visit Number 1   Number of Visits 12   Date for PT Re-Evaluation 02/18/17   Authorization Type FOTO every 5th visit and/or at time of MD visit.   PT Start Time 0945   PT Stop Time 1029   PT Time Calculation (min) 44 min   Activity Tolerance Patient tolerated treatment well   Behavior During Therapy WFL for tasks assessed/performed      Past Medical History:  Diagnosis Date  . Allergy   . Anxiety   . Asthma   . Cancer ([place])    breast  . Depression   . Diverticulosis of sigmoid colon 07/14/2011  . GERD (gastroesophageal reflux disease)   . Hypertension   . Migraine   . PONV (postoperative nausea and vomiting)     Past Surgical History:  Procedure Laterality Date  . ABDOMINAL HYSTERECTOMY  02/2010   partial  . BREAST REDUCTION SURGERY  02/2011  . BREAST SURGERY     cancer  . CHOLECYSTECTOMY  07/17/11   w/IOC  . NASAL SINUS SURGERY  02/2011  . TONSILLECTOMY  age 15    There were no vitals filed for this visit.       Subjective Assessment - 01/21/17 1016    Subjective The patient presents to OPPT s/p left knee medial menicectomy performed on 01/12/17.  She initially injured her knee on 11/15/16.  Prior to surgery her knee was locking, and buckling.  She prsented to theclinic today with a TED hose donned and walking without assistive device.  She does states that as her knee pain increases with increased up time she will use her crutuches as needed.  Her pain-level today is a 6/10 and can go higher the more she is up.  Ice and elevation decrease pain.  She states that she has a Bakers cyst.   Limitations Walking   How  long can you walk comfortably? Short distances.   Patient Stated Goals Get back to normal and walk for exercise.   Currently in Pain? Yes   Pain Score 6    Pain Location Knee   Pain Orientation Left   Pain Descriptors / Indicators Aching   Pain Type Surgical pain   Pain Onset More than a month ago   Pain Frequency Constant   Aggravating Factors  See above.   Pain Relieving Factors See above.            [facility] PT Assessment - 01/21/17 0001      Assessment   Medical Diagnosis Left knee medial meniscectomy.   Referring Provider Victorino December MD   Onset Date/Surgical Date --  01/12/17 (surgery date).     Precautions   Precautions --  PAIN-FREE LT QUADRICEPS STRENGTHENING.     Restrictions   Weight Bearing Restrictions No     Balance Screen   Has the patient fallen in the past 6 months No   Has the patient had a decrease in activity level because of a fear of falling?  Yes   Is the patient reluctant to leave their home because of a fear of falling?  Yes     Home Environment  Living Environment Private residence     Prior Function   Level of Independence Independent     Observation/Other Assessments   Observations Posterior left knee very ecchymotic currently.  Sterile bandaids covering left anterior knee.     Observation/Other Assessments-Edema    Edema Circumferential     Circumferential Edema   Circumferential - Right LT 3 cms > RT.     ROM / Strength   AROM / PROM / Strength AROM;Strength     AROM   Overall AROM Comments Left knee extension in supine to -5 degrees with minimal overpressure and active flexion to 75 degrees.     Strength   Overall Strength Comments Left hip flexion= 4-/5.  left qaudriceps atrophy and loss of volitional contraction of this mucle group.     Palpation   Palpation comment Very tender to palpation over left knee medial joint line and in region of popliteal fossa.     Ambulation/Gait   Gait Comments Antalgic gait with left knee  held in some flexion.            Objective measurements completed on examination: See above findings.          [facility] Adult PT Treatment/Exercise - 01/21/17 0001      Modalities   Modalities Electrical Stimulation;Vasopneumatic     Electrical Stimulation   Electrical Stimulation Location Left knee.   Electrical Stimulation Action IFC   Electrical Stimulation Parameters Constant at 1-10 Hz x 20 minutes.   Electrical Stimulation Goals Edema;Pain     Vasopneumatic   Number Minutes Vasopneumatic  20 minutes   Vasopnuematic Location  --  Left knee.   Vasopneumatic Pressure Medium                     PT Long Term Goals - 01/21/17 1123      PT LONG TERM GOAL #1   Title Independent with a HEP.   Time 4   Period Weeks   Status New     PT LONG TERM GOAL #2   Title Full active left knee extension in order to normalize gait   Time 4   Period Weeks   Status New     PT LONG TERM GOAL #3   Title Active knee flexion to 120 degrees+ so the patient can perform functional tasks and do so with pain not > 2-3/10.   Time 4   Period Weeks   Status New     PT LONG TERM GOAL #4   Title Increase left knee and hip strength to a solid 4+/5 to provide good stability for accomplishment of functional activities   Time 4   Period Weeks   Status New     PT LONG TERM GOAL #5   Title Perform a reciprocating stair gait with one railing with pain not > 2-3/10.   Time 4   Period Weeks   Status New                Plan - 01/21/17 1117    Clinical Impression Statement The patient is s/p left knee medial meniscectomy performed on 01/12/17.  She has a loss of left knee range of motion, strength and an expected amount of edema.  Her deficits limit have impaired her functional mobility and ability to perform ADL's.  She is compliant to using TED hose and states it makes her knee feel better.  Patient will benefit from skilled physical therapy to address deficits.  Clinical Presentation Stable   Clinical Presentation due to: Post-surgical outcome.   Clinical Decision Making Low   Rehab Potential Excellent   PT Frequency 3x / week   PT Duration 4 weeks   PT Treatment/Interventions ADLs/Self Care Home Management;Cryotherapy;Electrical Stimulation;Functional mobility training;Stair training;Gait training;Ultrasound;Therapeutic activities;Therapeutic exercise;Neuromuscular re-education;Patient/family education;Passive range of motion;Manual techniques;Vasopneumatic Device   PT Next Visit Plan Nustep; SLR's; hip abduction; PAIN-FREE left quadriceps strengthening.  VMS; gentle PROM.  Electrical stimulation and vasopneumatic.   Consulted and Agree with Plan of Care Patient      Patient will benefit from skilled therapeutic intervention in order to improve the following deficits and impairments:  Abnormal gait, Decreased activity tolerance, Decreased mobility, Decreased range of motion, Decreased strength, Increased edema, Pain  Visit Diagnosis: Acute pain of left knee - Plan: PT plan of care cert/re-cert  Stiffness of left knee, not elsewhere classified - Plan: PT plan of care cert/re-cert  Localized edema - Plan: PT plan of care cert/re-cert  Muscle weakness (generalized) - Plan: PT plan of care cert/re-cert      G-Codes - 29/93/71 1015    Functional Assessment Tool Used (Outpatient Only) FOTO...85% limitation.   Functional Limitation Mobility: Walking and moving around   Mobility: Walking and Moving Around Current Status [phone]) At least 80 percent but less than 100 percent impaired, limited or restricted   Mobility: Walking and Moving Around Goal Status [phone]) At least 20 percent but less than 40 percent impaired, limited or restricted       Problem List Patient Active Problem List   Diagnosis Date Noted  . Attention deficit disorder (ADD) without hyperactivity 12/19/2016  . Acute recurrent frontal sinusitis 10/17/2016  . Tinnitus of both  ears 10/17/2016  . Vertigo 10/17/2016  . Asthma 09/05/2016  . Body mass index 31.0-31.9, adult 06/09/2016  . Chronic cholecystitis with calculus 07/14/2011  . GERD (gastroesophageal reflux disease) 07/14/2011  . Diverticulosis of sigmoid colon 07/14/2011  . ESSENTIAL HYPERTENSION, BENIGN 06/07/2009  . ABDOMINAL PAIN, LEFT LOWER QUADRANT 06/07/2009    Hye Trawick, [place] MPT 01/21/2017, 11:35 AM  [facility] [address] [place], [place], [address] Phone: [phone]   Fax:  [phone]  Name: Deanna [name] MRN: [id number] Date of Birth: 12-Apr-1961

## 2017-01-21 NOTE — Patient Instructions (Signed)
Reviewed patient's HEP comprised of:  Ankle pumps; quad sets; heel slides and SLR's.

## 2017-01-23 ENCOUNTER — Ambulatory Visit: Payer: 59 | Admitting: *Deleted

## 2017-01-23 DIAGNOSIS — M25562 Pain in left knee: Secondary | ICD-10-CM

## 2017-01-23 DIAGNOSIS — M6281 Muscle weakness (generalized): Secondary | ICD-10-CM

## 2017-01-23 DIAGNOSIS — R600 Localized edema: Secondary | ICD-10-CM

## 2017-01-23 DIAGNOSIS — M25662 Stiffness of left knee, not elsewhere classified: Secondary | ICD-10-CM

## 2017-01-23 NOTE — Therapy (Signed)
[facility] [place], [place], [address] Phone: [phone]   Fax:  [phone]  Physical Therapy Treatment  Patient Details  Name: Deanna [name] MRN: [id number] Date of Birth: 05/14/1961 Referring Provider: Victorino December MD  Encounter Date: 01/23/2017      PT End of Session - 01/23/17 0913    Visit Number 2   Number of Visits 12   Date for PT Re-Evaluation 02/18/17   Authorization Type FOTO every 5th visit and/or at time of MD visit.   PT Start Time 0815   PT Stop Time 0917   PT Time Calculation (min) 62 min      Past Medical History:  Diagnosis Date  . Allergy   . Anxiety   . Asthma   . Cancer ([place])    breast  . Depression   . Diverticulosis of sigmoid colon 07/14/2011  . GERD (gastroesophageal reflux disease)   . Hypertension   . Migraine   . PONV (postoperative nausea and vomiting)     Past Surgical History:  Procedure Laterality Date  . ABDOMINAL HYSTERECTOMY  02/2010   partial  . BREAST REDUCTION SURGERY  02/2011  . BREAST SURGERY     cancer  . CHOLECYSTECTOMY  07/17/11   w/IOC  . NASAL SINUS SURGERY  02/2011  . TONSILLECTOMY  age 36    There were no vitals filed for this visit.      Subjective Assessment - 01/23/17 0816    Subjective The patient presents to OPPT s/p left knee medial menicectomy performed on 01/12/17.  She initially injured her knee on 11/15/16.  Prior to surgery her knee was locking, and buckling.  She prsented to theclinic today with a TED hose donned and walking with crutches today.  She does states that as her knee pain increases with increased up time she will use her crutuches as needed.  Her pain-level today is a 6/10 and can go higher the more she is up.  Ice and elevation decrease pain.  She states that she has a Bakers cyst.   Limitations Walking   How long can you walk comfortably? Short distances.   Patient Stated Goals Get back to normal and walk for exercise.    Currently in Pain? Yes   Pain Score 6    Pain Location Abdomen   Pain Orientation Left   Pain Descriptors / Indicators Aching   Pain Type Surgical pain   Pain Onset More than a month ago                         [facility] Adult PT Treatment/Exercise - 01/23/17 0001      Ambulation/Gait   Gait Comments Antalgic gait with left knee held in some flexion.     Exercises   Exercises Knee/Hip     Knee/Hip Exercises: Aerobic   Nustep Nustep L1 x 15 mins for ROM     Knee/Hip Exercises: Supine   Quad Sets AROM;Left;3 sets;10 reps   Heel Slides AROM;Left;3 sets;10 reps;AAROM   Straight Leg Raises AROM;Right;10 reps     Modalities   Modalities Location manager Stimulation Location Left knee.IFC x 15 mins 1-10hz     Electrical Stimulation Goals Edema;Pain     Vasopneumatic   Number Minutes Vasopneumatic  15 minutes   Vasopnuematic Location  Knee   Vasopneumatic Pressure Medium   Vasopneumatic Temperature  36  Manual Therapy   Manual Therapy Passive ROM   Passive ROM Patella mobs and flexion PROM to 105 degrees                     PT Long Term Goals - 01/21/17 1123      PT LONG TERM GOAL #1   Title Independent with a HEP.   Time 4   Period Weeks   Status New     PT LONG TERM GOAL #2   Title Full active left knee extension in order to normalize gait   Time 4   Period Weeks   Status New     PT LONG TERM GOAL #3   Title Active knee flexion to 120 degrees+ so the patient can perform functional tasks and do so with pain not > 2-3/10.   Time 4   Period Weeks   Status New     PT LONG TERM GOAL #4   Title Increase left knee and hip strength to a solid 4+/5 to provide good stability for accomplishment of functional activities   Time 4   Period Weeks   Status New     PT LONG TERM GOAL #5   Title Perform a reciprocating stair gait with one railing with pain not > 2-3/10.   Time 4    Period Weeks   Status New               Plan - 01/23/17 0831    Clinical Impression Statement Pt arrived today using bilateral crutches for gait. She was still in quite a bit of pain, but had better quad activation and was able to perform SLR with no lag. PROM to 105 degrees today.   Rehab Potential Excellent   PT Frequency 3x / week   PT Duration 4 weeks   PT Treatment/Interventions ADLs/Self Care Home Management;Cryotherapy;Electrical Stimulation;Functional mobility training;Stair training;Gait training;Ultrasound;Therapeutic activities;Therapeutic exercise;Neuromuscular re-education;Patient/family education;Passive range of motion;Manual techniques;Vasopneumatic Device   PT Next Visit Plan Nustep; SLR's; hip abduction; PAIN-FREE left quadriceps strengthening.  VMS; gentle PROM.  Electrical stimulation and vasopneumatic.   Consulted and Agree with Plan of Care Patient      Patient will benefit from skilled therapeutic intervention in order to improve the following deficits and impairments:  Abnormal gait, Decreased activity tolerance, Decreased mobility, Decreased range of motion, Decreased strength, Increased edema, Pain  Visit Diagnosis: Acute pain of left knee  Stiffness of left knee, not elsewhere classified  Localized edema  Muscle weakness (generalized)     Problem List Patient Active Problem List   Diagnosis Date Noted  . Attention deficit disorder (ADD) without hyperactivity 12/19/2016  . Acute recurrent frontal sinusitis 10/17/2016  . Tinnitus of both ears 10/17/2016  . Vertigo 10/17/2016  . Asthma 09/05/2016  . Body mass index 31.0-31.9, adult 06/09/2016  . Chronic cholecystitis with calculus 07/14/2011  . GERD (gastroesophageal reflux disease) 07/14/2011  . Diverticulosis of sigmoid colon 07/14/2011  . ESSENTIAL HYPERTENSION, BENIGN 06/07/2009  . ABDOMINAL PAIN, LEFT LOWER QUADRANT 06/07/2009    RAMSEUR,CHRIS, PTA 01/23/2017, 9:36 AM  [facility] [place], [place], [address] Phone: [phone]   Fax:  [phone]  Name: DELOYCE WALTHERS MRN: [id number] Date of Birth: [birth year]

## 2017-01-26 ENCOUNTER — Ambulatory Visit: Payer: 59 | Attending: Orthopedic Surgery | Admitting: Physical Therapy

## 2017-01-26 DIAGNOSIS — M25662 Stiffness of left knee, not elsewhere classified: Secondary | ICD-10-CM | POA: Diagnosis present

## 2017-01-26 DIAGNOSIS — R600 Localized edema: Secondary | ICD-10-CM

## 2017-01-26 DIAGNOSIS — M25562 Pain in left knee: Secondary | ICD-10-CM | POA: Insufficient documentation

## 2017-01-26 DIAGNOSIS — M6281 Muscle weakness (generalized): Secondary | ICD-10-CM | POA: Insufficient documentation

## 2017-01-26 NOTE — Therapy (Signed)
[facility] [place], [place], [address] Phone: [phone]   Fax:  [phone]  Physical Therapy Treatment  Patient Details  Name: Deanna [name] MRN: [id number] Date of Birth: 1960-10-19 Referring Provider: Victorino December MD  Encounter Date: 01/26/2017      PT End of Session - 01/26/17 0948    Visit Number 3   Number of Visits 12   Authorization Type FOTO every 5th visit and/or at time of MD visit.   PT Start Time [phone]   PT Stop Time 1040   PT Time Calculation (min) 54 min   Activity Tolerance Patient tolerated treatment well   Behavior During Therapy WFL for tasks assessed/performed      Past Medical History:  Diagnosis Date  . Allergy   . Anxiety   . Asthma   . Cancer ([place])    breast  . Depression   . Diverticulosis of sigmoid colon 07/14/2011  . GERD (gastroesophageal reflux disease)   . Hypertension   . Migraine   . PONV (postoperative nausea and vomiting)     Past Surgical History:  Procedure Laterality Date  . ABDOMINAL HYSTERECTOMY  02/2010   partial  . BREAST REDUCTION SURGERY  02/2011  . BREAST SURGERY     cancer  . CHOLECYSTECTOMY  07/17/11   w/IOC  . NASAL SINUS SURGERY  02/2011  . TONSILLECTOMY  age 56    There were no vitals filed for this visit.      Subjective Assessment - 01/26/17 0949    Subjective Patient presents today with c/o pain at 4/10; 6/10 with ROM on bike. She continues to ice nghtly. She uses her crutches as needed.   Limitations Walking   How long can you walk comfortably? Short distances.   Patient Stated Goals Get back to normal and walk for exercise.   Currently in Pain? Yes   Pain Score 4    Pain Location Knee   Pain Orientation Left   Pain Descriptors / Indicators Aching   Pain Type Surgical pain   Pain Onset More than a month ago   Pain Frequency Constant   Aggravating Factors  walking   Pain Relieving Factors ice            [facility] PT Assessment -  01/26/17 0001      ROM / Strength   AROM / PROM / Strength AROM;PROM     AROM   AROM Assessment Site Knee   Right/Left Knee Left   Left Knee Flexion 95     PROM   PROM Assessment Site Knee   Right/Left Knee Left   Left Knee Flexion 110                     [facility] Adult PT Treatment/Exercise - 01/26/17 0001      Knee/Hip Exercises: Aerobic   Stationary Bike partial revolutions x 10 min     Knee/Hip Exercises: Supine   Quad Sets AROM;Left;3 sets;10 reps   Straight Leg Raises Strengthening;Left;1 set;10 reps  with assist due to quad fatigue     Modalities   Modalities Electrical Stimulation;Cryotherapy     Cryotherapy   Number Minutes Cryotherapy 15 Minutes   Cryotherapy Location Knee   Type of Cryotherapy Ice pack     Electrical Stimulation   Electrical Stimulation Location Left knee.IFC x 15 mins 1-10hz     Electrical Stimulation Goals Edema;Pain     Manual Therapy   Manual Therapy Passive ROM;Joint  mobilization;Soft tissue mobilization   Joint Mobilization patellar mobs   Soft tissue mobilization to L quadriceps and adductors   Passive ROM into flexion                PT Education - 01/26/17 1030    Education provided Yes   Education Details self care: MFR with rolling pin and tennis ball   Person(s) Educated Patient   Methods Explanation;Demonstration;Tactile cues;Verbal cues   Comprehension Verbalized understanding             PT Long Term Goals - 01/26/17 1236      PT LONG TERM GOAL #1   Title Independent with a HEP.   Time 4   Period Weeks   Status On-going     PT LONG TERM GOAL #2   Title Full active left knee extension in order to normalize gait   Time 4   Period Weeks   Status On-going     PT LONG TERM GOAL #3   Title Active knee flexion to 120 degrees+ so the patient can perform functional tasks and do so with pain not > 2-3/10.   Time 4   Period Weeks   Status On-going     PT LONG TERM GOAL #4   Title Increase  left knee and hip strength to a solid 4+/5 to provide good stability for accomplishment of functional activities   Time 4   Period Weeks   Status On-going     PT LONG TERM GOAL #5   Title Perform a reciprocating stair gait with one railing with pain not > 2-3/10.   Time 4   Period Weeks   Status On-going               Plan - 01/26/17 1234    Clinical Impression Statement Patient arrived today without AD. She did fairly well with therex, but has significant quad weakness. She has marked tightness and multiple triggerpoints throughout L quads, adductors and along ITB.  She responded well to manual therapy and self STW/MFR was recommended for home. She still has significant pain with ROM. LTGs are ongoing.   Clinical Presentation Stable   Rehab Potential Excellent   PT Frequency 3x / week   PT Duration 4 weeks   PT Treatment/Interventions ADLs/Self Care Home Management;Cryotherapy;Electrical Stimulation;Functional mobility training;Stair training;Gait training;Ultrasound;Therapeutic activities;Therapeutic exercise;Neuromuscular re-education;Patient/family education;Passive range of motion;Manual techniques;Vasopneumatic Device   PT Next Visit Plan Nustep; SLR's; hip abduction; PAIN-FREE left quadriceps strengthening.  VMS; gentle PROM.  Electrical stimulation and vasopneumatic.   Consulted and Agree with Plan of Care Patient      Patient will benefit from skilled therapeutic intervention in order to improve the following deficits and impairments:  Abnormal gait, Decreased activity tolerance, Decreased mobility, Decreased range of motion, Decreased strength, Increased edema, Pain  Visit Diagnosis: Acute pain of left knee  Stiffness of left knee, not elsewhere classified  Localized edema  Muscle weakness (generalized)     Problem List Patient Active Problem List   Diagnosis Date Noted  . Attention deficit disorder (ADD) without hyperactivity 12/19/2016  . Acute recurrent  frontal sinusitis 10/17/2016  . Tinnitus of both ears 10/17/2016  . Vertigo 10/17/2016  . Asthma 09/05/2016  . Body mass index 31.0-31.9, adult 06/09/2016  . Chronic cholecystitis with calculus 07/14/2011  . GERD (gastroesophageal reflux disease) 07/14/2011  . Diverticulosis of sigmoid colon 07/14/2011  . ESSENTIAL HYPERTENSION, BENIGN 06/07/2009  . ABDOMINAL PAIN, LEFT LOWER QUADRANT 06/07/2009    Madelyn Flavors  PT 01/26/2017, 12:40 PM  [facility] [place], [place], [address] Phone: [phone]   Fax:  [phone]  Name: Deanna [name] MRN: [id number] Date of Birth: 1960/08/11

## 2017-01-29 ENCOUNTER — Encounter: Payer: Self-pay | Admitting: Physical Therapy

## 2017-01-29 ENCOUNTER — Ambulatory Visit: Payer: 59 | Admitting: Physical Therapy

## 2017-01-29 DIAGNOSIS — M25562 Pain in left knee: Secondary | ICD-10-CM | POA: Diagnosis not present

## 2017-01-29 DIAGNOSIS — M6281 Muscle weakness (generalized): Secondary | ICD-10-CM

## 2017-01-29 DIAGNOSIS — R600 Localized edema: Secondary | ICD-10-CM

## 2017-01-29 DIAGNOSIS — M25662 Stiffness of left knee, not elsewhere classified: Secondary | ICD-10-CM

## 2017-01-29 NOTE — Therapy (Signed)
[facility] [place], [place], [address] Phone: [phone]   Fax:  [phone]  Physical Therapy Treatment  Patient Details  Name: Deanna [name] MRN: [id number] Date of Birth: Dec 13, 1960 Referring Provider: Victorino December MD  Encounter Date: 01/29/2017      PT End of Session - 01/29/17 0950    Visit Number 4   Number of Visits 12   Date for PT Re-Evaluation 02/18/17   Authorization Type FOTO every 5th visit and/or at time of MD visit.   PT Start Time [phone]   PT Stop Time 1039   PT Time Calculation (min) 52 min   Activity Tolerance Patient tolerated treatment well   Behavior During Therapy WFL for tasks assessed/performed      Past Medical History:  Diagnosis Date  . Allergy   . Anxiety   . Asthma   . Cancer ([place])    breast  . Depression   . Diverticulosis of sigmoid colon 07/14/2011  . GERD (gastroesophageal reflux disease)   . Hypertension   . Migraine   . PONV (postoperative nausea and vomiting)     Past Surgical History:  Procedure Laterality Date  . ABDOMINAL HYSTERECTOMY  02/2010   partial  . BREAST REDUCTION SURGERY  02/2011  . BREAST SURGERY     cancer  . CHOLECYSTECTOMY  07/17/11   w/IOC  . NASAL SINUS SURGERY  02/2011  . TONSILLECTOMY  age 81    There were no vitals filed for this visit.      Subjective Assessment - 01/29/17 0949    Subjective Reports that she has been going up and down four stairs at home and now her knee has been throbbing and locked again as well. Reports that she had knots in her L Quad after SLR.   Limitations Walking   How long can you walk comfortably? Short distances.   Patient Stated Goals Get back to normal and walk for exercise.   Currently in Pain? Yes   Pain Score 5    Pain Location Knee   Pain Orientation Left   Pain Descriptors / Indicators Throbbing;Discomfort   Pain Type Surgical pain   Pain Onset More than a month ago            [facility] PT  Assessment - 01/29/17 0001      Assessment   Medical Diagnosis Left knee medial meniscectomy.   Onset Date/Surgical Date 01/12/17   Next MD Visit 02/24/2017     Restrictions   Weight Bearing Restrictions No                     [facility] Adult PT Treatment/Exercise - 01/29/17 0001      Knee/Hip Exercises: Aerobic   Stationary Bike partial revolutions x 10 min     Knee/Hip Exercises: Standing   Hip Abduction AROM;Left;2 sets;10 reps;Knee straight   Rocker Board 3 minutes     Knee/Hip Exercises: Supine   Short Arc Quad Sets AROM;Left;3 sets;10 reps   Heel Slides AROM  Attempted but stopped secondary to pain   Straight Leg Raises Strengthening;Left  x2 reps but stopped secondary to pain and TPs     Modalities   Modalities Electrical Stimulation;Vasopneumatic     Electrical Stimulation   Electrical Stimulation Location L knee   Electrical Stimulation Action IFC   Electrical Stimulation Parameters 1-10 hz x15 min   Electrical Stimulation Goals Edema;Pain     Vasopneumatic   Number Minutes Vasopneumatic  15 minutes   Vasopnuematic Location  Knee   Vasopneumatic Pressure Medium   Vasopneumatic Temperature  71     Manual Therapy   Manual Therapy Soft tissue mobilization   Soft tissue mobilization STW to L Quad/ITB to reduce tightness and pain                     PT Long Term Goals - 01/26/17 1236      PT LONG TERM GOAL #1   Title Independent with a HEP.   Time 4   Period Weeks   Status On-going     PT LONG TERM GOAL #2   Title Full active left knee extension in order to normalize gait   Time 4   Period Weeks   Status On-going     PT LONG TERM GOAL #3   Title Active knee flexion to 120 degrees+ so the patient can perform functional tasks and do so with pain not > 2-3/10.   Time 4   Period Weeks   Status On-going     PT LONG TERM GOAL #4   Title Increase left knee and hip strength to a solid 4+/5 to provide good stability for  accomplishment of functional activities   Time 4   Period Weeks   Status On-going     PT LONG TERM GOAL #5   Title Perform a reciprocating stair gait with one railing with pain not > 2-3/10.   Time 4   Period Weeks   Status On-going               Plan - 01/29/17 1041    Clinical Impression Statement Patient arrived to treatment with increased pain and patient continued to have pain intermittantly throughout treatment. Patient experienced increased pain with L heel slides and SLR today thus stopped. Patient then reported TPs in L Quad following SLR in previous treatment. TPs and tightness palpated today agian in L Quad, ITB region again with soreness reported by patient during manual therapy. Patient reeducated regarding QS technique as well as SAQ and encouraged to complete at home. Normal modalities response noted following removal of the modalities.    Rehab Potential Excellent   PT Frequency 3x / week   PT Duration 4 weeks   PT Treatment/Interventions ADLs/Self Care Home Management;Cryotherapy;Electrical Stimulation;Functional mobility training;Stair training;Gait training;Ultrasound;Therapeutic activities;Therapeutic exercise;Neuromuscular re-education;Patient/family education;Passive range of motion;Manual techniques;Vasopneumatic Device   PT Next Visit Plan Continue with painfree L Quad strengthening per MPT POC with modalities PRN.   PT Home Exercise Plan VCs for QS and SAQ   Consulted and Agree with Plan of Care Patient      Patient will benefit from skilled therapeutic intervention in order to improve the following deficits and impairments:  Abnormal gait, Decreased activity tolerance, Decreased mobility, Decreased range of motion, Decreased strength, Increased edema, Pain  Visit Diagnosis: Acute pain of left knee  Stiffness of left knee, not elsewhere classified  Localized edema  Muscle weakness (generalized)     Problem List Patient Active Problem List    Diagnosis Date Noted  . Attention deficit disorder (ADD) without hyperactivity 12/19/2016  . Acute recurrent frontal sinusitis 10/17/2016  . Tinnitus of both ears 10/17/2016  . Vertigo 10/17/2016  . Asthma 09/05/2016  . Body mass index 31.0-31.9, adult 06/09/2016  . Chronic cholecystitis with calculus 07/14/2011  . GERD (gastroesophageal reflux disease) 07/14/2011  . Diverticulosis of sigmoid colon 07/14/2011  . ESSENTIAL HYPERTENSION, BENIGN 06/07/2009  . ABDOMINAL PAIN, LEFT LOWER  QUADRANT 06/07/2009    Wynelle Fanny, PTA 01/29/2017, 10:48 AM  [facility] [address] [place], [place], [address] Phone: [phone]   Fax:  [phone]  Name: Deanna [name] MRN: [id number] Date of Birth: 28-Mar-1961

## 2017-02-02 ENCOUNTER — Ambulatory Visit: Payer: 59 | Admitting: *Deleted

## 2017-02-02 DIAGNOSIS — R600 Localized edema: Secondary | ICD-10-CM

## 2017-02-02 DIAGNOSIS — M25562 Pain in left knee: Secondary | ICD-10-CM | POA: Diagnosis not present

## 2017-02-02 DIAGNOSIS — M6281 Muscle weakness (generalized): Secondary | ICD-10-CM

## 2017-02-02 DIAGNOSIS — M25662 Stiffness of left knee, not elsewhere classified: Secondary | ICD-10-CM

## 2017-02-02 NOTE — Therapy (Signed)
[facility] [place], [place], [address] Phone: [phone]   Fax:  [phone]  Physical Therapy Treatment  Patient Details  Name: Deanna [name] MRN: [id number] Date of Birth: [birth year] Referring Provider: Victorino December MD  Encounter Date: 02/02/2017      PT End of Session - 02/02/17 1133    Visit Number 5   Number of Visits 12   Date for PT Re-Evaluation 02/18/17   Authorization Type FOTO every 5th visit and/or at time of MD visit.   PT Start Time [phone]   PT Stop Time 1051   PT Time Calculation (min) 59 min      Past Medical History:  Diagnosis Date  . Allergy   . Anxiety   . Asthma   . Cancer ([place])    breast  . Depression   . Diverticulosis of sigmoid colon 07/14/2011  . GERD (gastroesophageal reflux disease)   . Hypertension   . Migraine   . PONV (postoperative nausea and vomiting)     Past Surgical History:  Procedure Laterality Date  . ABDOMINAL HYSTERECTOMY  02/2010   partial  . BREAST REDUCTION SURGERY  02/2011  . BREAST SURGERY     cancer  . CHOLECYSTECTOMY  07/17/11   w/IOC  . NASAL SINUS SURGERY  02/2011  . TONSILLECTOMY  age 56    There were no vitals filed for this visit.      Subjective Assessment - 02/02/17 0953    Subjective LT knee 4/10. It still "kicks back" on me.   Limitations Walking   How long can you walk comfortably? Short distances.   Patient Stated Goals Get back to normal and walk for exercise.   Currently in Pain? Yes   Pain Score 4    Pain Location Knee   Pain Orientation Left   Pain Descriptors / Indicators Throbbing;Discomfort   Pain Type Surgical pain   Pain Onset More than a month ago                         [facility] Adult PT Treatment/Exercise - 02/02/17 0001      Knee/Hip Exercises: Aerobic   Nustep Nustep L3 x 15 mins for ROM     Knee/Hip Exercises: Standing   Heel Raises Both;20 reps   Knee Flexion AROM;Both;20 reps  marching   Hip  Abduction AROM;Left;2 sets;10 reps;Knee straight   Rocker Board 3 minutes     Knee/Hip Exercises: Seated   Long Arc Quad Left;3 sets;10 reps   Long Arc Quad Weight 2 lbs.     Modalities   Modalities Location manager Stimulation Location Left knee.IFC x 15 mins 1-10hz     Electrical Stimulation Goals Edema;Pain     Vasopneumatic   Number Minutes Vasopneumatic  15 minutes   Vasopnuematic Location  Knee   Vasopneumatic Pressure Medium   Vasopneumatic Temperature  34     Manual Therapy   Manual Therapy Soft tissue mobilization   Soft tissue mobilization STW to L Quad/ITB to reduce tightness and pain                     PT Long Term Goals - 01/26/17 1236      PT LONG TERM GOAL #1   Title Independent with a HEP.   Time 4   Period Weeks   Status On-going     PT LONG TERM GOAL #  2   Title Full active left knee extension in order to normalize gait   Time 4   Period Weeks   Status On-going     PT LONG TERM GOAL #3   Title Active knee flexion to 120 degrees+ so the patient can perform functional tasks and do so with pain not > 2-3/10.   Time 4   Period Weeks   Status On-going     PT LONG TERM GOAL #4   Title Increase left knee and hip strength to a solid 4+/5 to provide good stability for accomplishment of functional activities   Time 4   Period Weeks   Status On-going     PT LONG TERM GOAL #5   Title Perform a reciprocating stair gait with one railing with pain not > 2-3/10.   Time 4   Period Weeks   Status On-going               Plan - 02/02/17 1136    PT Next Visit Plan Continue with painfree L Quad strengthening per MPT POC with modalities PRN.   perform church pews, rock -backs, and standing TKEs for [employer] quad activation   PT Home Exercise Plan VCs for QS and SAQ   Consulted and Agree with Plan of Care Patient      Patient will benefit from skilled therapeutic intervention in order  to improve the following deficits and impairments:     Visit Diagnosis: Acute pain of left knee  Stiffness of left knee, not elsewhere classified  Localized edema  Muscle weakness (generalized)     Problem List Patient Active Problem List   Diagnosis Date Noted  . Attention deficit disorder (ADD) without hyperactivity 12/19/2016  . Acute recurrent frontal sinusitis 10/17/2016  . Tinnitus of both ears 10/17/2016  . Vertigo 10/17/2016  . Asthma 09/05/2016  . Body mass index 31.0-31.9, adult 06/09/2016  . Chronic cholecystitis with calculus 07/14/2011  . GERD (gastroesophageal reflux disease) 07/14/2011  . Diverticulosis of sigmoid colon 07/14/2011  . ESSENTIAL HYPERTENSION, BENIGN 06/07/2009  . ABDOMINAL PAIN, LEFT LOWER QUADRANT 06/07/2009    Jada Kuhnert,CHRIS, PTA 02/02/2017, 11:43 AM  [facility] [place], [place], [address] Phone: [phone]   Fax:  [phone]  Name: Deanna [name] MRN: [id number] Date of Birth: June 03, 1961

## 2017-02-05 ENCOUNTER — Ambulatory Visit: Payer: 59 | Admitting: Physical Therapy

## 2017-02-05 DIAGNOSIS — M6281 Muscle weakness (generalized): Secondary | ICD-10-CM

## 2017-02-05 DIAGNOSIS — M25562 Pain in left knee: Secondary | ICD-10-CM | POA: Diagnosis not present

## 2017-02-05 DIAGNOSIS — M25662 Stiffness of left knee, not elsewhere classified: Secondary | ICD-10-CM

## 2017-02-05 DIAGNOSIS — R600 Localized edema: Secondary | ICD-10-CM

## 2017-02-05 NOTE — Therapy (Signed)
[facility] [place], [place], [address] Phone: [phone]   Fax:  [phone]  Physical Therapy Treatment  Patient Details  Name: Deanna [name] MRN: [id number] Date of Birth: February 04, 1961 Referring Provider: Victorino December MD  Encounter Date: 02/05/2017      PT End of Session - 02/05/17 1111    Visit Number 6   Number of Visits 12   Date for PT Re-Evaluation 02/18/17   Authorization Type FOTO every 5th visit and/or at time of MD visit.   PT Start Time 0903   PT Stop Time 0953   PT Time Calculation (min) 50 min   Activity Tolerance Patient tolerated treatment well   Behavior During Therapy [facility] for tasks assessed/performed      Past Medical History:  Diagnosis Date  . Allergy   . Anxiety   . Asthma   . Cancer ([place])    breast  . Depression   . Diverticulosis of sigmoid colon 07/14/2011  . GERD (gastroesophageal reflux disease)   . Hypertension   . Migraine   . PONV (postoperative nausea and vomiting)     Past Surgical History:  Procedure Laterality Date  . ABDOMINAL HYSTERECTOMY  02/2010   partial  . BREAST REDUCTION SURGERY  02/2011  . BREAST SURGERY     cancer  . CHOLECYSTECTOMY  07/17/11   w/IOC  . NASAL SINUS SURGERY  02/2011  . TONSILLECTOMY  age 31    There were no vitals filed for this visit.      Subjective Assessment - 02/05/17 1111    Subjective I almost didn't come in today.  I went for a 45 minute walk up and down hills and on gravel.     Patient Stated Goals Get back to normal and walk for exercise.   Pain Score 7    Pain Location Knee   Pain Orientation Left   Pain Descriptors / Indicators Throbbing;Discomfort   Pain Onset More than a month ago                         [facility] Adult PT Treatment/Exercise - 02/05/17 0001      Exercises   Exercises Knee/Hip     Knee/Hip Exercises: Aerobic   Nustep Level 3 Nustep x 8 minutes.     Modalities   Modalities Nurse, learning disability Stimulation Location Left knee.   Electrical Stimulation Action IFC   Electrical Stimulation Parameters 80-150 Hz x 15 minutes.   Electrical Stimulation Goals Edema;Pain     Vasopneumatic   Number Minutes Vasopneumatic  15 minutes   Vasopnuematic Location  --  Left knee.   Vasopneumatic Pressure Medium     Manual Therapy   Manual Therapy Soft tissue mobilization   Soft tissue mobilization STW/M to left quads and istal ITB x 15 minutes.                     PT Long Term Goals - 01/26/17 1236      PT LONG TERM GOAL #1   Title Independent with a HEP.   Time 4   Period Weeks   Status On-going     PT LONG TERM GOAL #2   Title Full active left knee extension in order to normalize gait   Time 4   Period Weeks   Status On-going     PT LONG TERM GOAL #3  Title Active knee flexion to 120 degrees+ so the patient can perform functional tasks and do so with pain not > 2-3/10.   Time 4   Period Weeks   Status On-going     PT LONG TERM GOAL #4   Title Increase left knee and hip strength to a solid 4+/5 to provide good stability for accomplishment of functional activities   Time 4   Period Weeks   Status On-going     PT LONG TERM GOAL #5   Title Perform a reciprocating stair gait with one railing with pain not > 2-3/10.   Time 4   Period Weeks   Status On-going               Plan - 02/05/17 1114    Clinical Impression Statement Increased left knee pain and swelling due to a long difficulty walk.      Patient will benefit from skilled therapeutic intervention in order to improve the following deficits and impairments:  Abnormal gait, Decreased activity tolerance, Decreased mobility, Decreased range of motion, Decreased strength, Increased edema, Pain  Visit Diagnosis: Acute pain of left knee  Stiffness of left knee, not elsewhere classified  Localized edema  Muscle weakness  (generalized)     Problem List Patient Active Problem List   Diagnosis Date Noted  . Attention deficit disorder (ADD) without hyperactivity 12/19/2016  . Acute recurrent frontal sinusitis 10/17/2016  . Tinnitus of both ears 10/17/2016  . Vertigo 10/17/2016  . Asthma 09/05/2016  . Body mass index 31.0-31.9, adult 06/09/2016  . Chronic cholecystitis with calculus 07/14/2011  . GERD (gastroesophageal reflux disease) 07/14/2011  . Diverticulosis of sigmoid colon 07/14/2011  . ESSENTIAL HYPERTENSION, BENIGN 06/07/2009  . ABDOMINAL PAIN, LEFT LOWER QUADRANT 06/07/2009    Latrel Szymczak, [place]  MPT 02/05/2017, 11:16 AM  [facility] [place], [place], [address] Phone: [phone]   Fax:  [phone]  Name: Deanna [name] MRN: [id number] Date of Birth: May 28, 1961

## 2017-02-10 ENCOUNTER — Ambulatory Visit: Payer: 59 | Admitting: Physical Therapy

## 2017-02-10 ENCOUNTER — Encounter: Payer: Self-pay | Admitting: Physical Therapy

## 2017-02-10 DIAGNOSIS — M6281 Muscle weakness (generalized): Secondary | ICD-10-CM

## 2017-02-10 DIAGNOSIS — M25562 Pain in left knee: Secondary | ICD-10-CM | POA: Diagnosis not present

## 2017-02-10 DIAGNOSIS — R600 Localized edema: Secondary | ICD-10-CM

## 2017-02-10 DIAGNOSIS — M25662 Stiffness of left knee, not elsewhere classified: Secondary | ICD-10-CM

## 2017-02-10 NOTE — Therapy (Signed)
[facility] [place], [place], [address] Phone: [phone]   Fax:  [phone]  Physical Therapy Treatment  Patient Details  Name: Deanna [name] MRN: [id number] Date of Birth: 14-Jul-1961 Referring Provider: Victorino December MD  Encounter Date: 02/10/2017      PT End of Session - 02/10/17 1400    Visit Number 7   Number of Visits 12   Date for PT Re-Evaluation 02/18/17   Authorization Type FOTO every 5th visit and/or at time of MD visit.   PT Start Time 1351   PT Stop Time 1445   PT Time Calculation (min) 54 min   Activity Tolerance Patient tolerated treatment well   Behavior During Therapy WFL for tasks assessed/performed      Past Medical History:  Diagnosis Date  . Allergy   . Anxiety   . Asthma   . Cancer ([place])    breast  . Depression   . Diverticulosis of sigmoid colon 07/14/2011  . GERD (gastroesophageal reflux disease)   . Hypertension   . Migraine   . PONV (postoperative nausea and vomiting)     Past Surgical History:  Procedure Laterality Date  . ABDOMINAL HYSTERECTOMY  02/2010   partial  . BREAST REDUCTION SURGERY  02/2011  . BREAST SURGERY     cancer  . CHOLECYSTECTOMY  07/17/11   w/IOC  . NASAL SINUS SURGERY  02/2011  . TONSILLECTOMY  age 52    There were no vitals filed for this visit.      Subjective Assessment - 02/10/17 1359    Subjective States that she has not been walking since recent flare up.   Limitations Walking   How long can you walk comfortably? Short distances.   Patient Stated Goals Get back to normal and walk for exercise.   Currently in Pain? Yes   Pain Score 4    Pain Location Knee   Pain Orientation Left   Pain Descriptors / Indicators Aching;Throbbing;Discomfort   Pain Type Surgical pain   Pain Onset More than a month ago            [facility] PT Assessment - 02/10/17 0001      Assessment   Medical Diagnosis Left knee medial meniscectomy.   Onset  Date/Surgical Date 01/12/17   Next MD Visit 02/24/2017     Restrictions   Weight Bearing Restrictions No     Observation/Other Assessments-Edema    Edema Circumferential     Circumferential Edema   Circumferential - Right 39.8 cm   Circumferential - Left  42.7 cm                     [facility] Adult PT Treatment/Exercise - 02/10/17 0001      Knee/Hip Exercises: Aerobic   Nustep L3 x16 min     Knee/Hip Exercises: Standing   Hip Abduction AROM;Left;3 sets;10 reps;Knee straight   Forward Step Up Left;2 sets;10 reps;Hand Hold: 2;Step Height: 6"     Knee/Hip Exercises: Supine   Short Arc Quad Sets Strengthening;Left;2 sets;10 reps   Short Arc Quad Sets Limitations 3#   Straight Leg Raises Other (comment)  Attempted but painful thus stopped     Modalities   Modalities Passenger transport manager Location L knee   Electrical Stimulation Action IFC   Electrical Stimulation Parameters 1-10 hz x15 min   Electrical Stimulation Goals Edema;Pain     Vasopneumatic   Number  Minutes Vasopneumatic  15 minutes   Vasopnuematic Location  Knee   Vasopneumatic Pressure Medium   Vasopneumatic Temperature  56                     PT Long Term Goals - 01/26/17 1236      PT LONG TERM GOAL #1   Title Independent with a HEP.   Time 4   Period Weeks   Status On-going     PT LONG TERM GOAL #2   Title Full active left knee extension in order to normalize gait   Time 4   Period Weeks   Status On-going     PT LONG TERM GOAL #3   Title Active knee flexion to 120 degrees+ so the patient can perform functional tasks and do so with pain not > 2-3/10.   Time 4   Period Weeks   Status On-going     PT LONG TERM GOAL #4   Title Increase left knee and hip strength to a solid 4+/5 to provide good stability for accomplishment of functional activities   Time 4   Period Weeks   Status On-going     PT LONG TERM  GOAL #5   Title Perform a reciprocating stair gait with one railing with pain not > 2-3/10.   Time 4   Period Weeks   Status On-going               Plan - 02/10/17 1510    Clinical Impression Statement Patient limited slightly today secondary to pain but encouraged to report any increased pain. Minimal discomfort reported with SAQ today and patient unable to complete SLR secondary to pain. Patient still very guarded with ambulation without AD and still very tender to palpation in posterior knee region surrounding baker's cyst per patient report. Normal modalities response noted following removal of the modalities.   Rehab Potential Excellent   PT Frequency 3x / week   PT Duration 4 weeks   PT Treatment/Interventions ADLs/Self Care Home Management;Cryotherapy;Electrical Stimulation;Functional mobility training;Stair training;Gait training;Ultrasound;Therapeutic activities;Therapeutic exercise;Neuromuscular re-education;Patient/family education;Passive range of motion;Manual techniques;Vasopneumatic Device   PT Next Visit Plan Continue with painfree L Quad strengthening per MPT POC with modalities PRN.   perform church pews, rock -backs, and standing TKEs for [employer] quad activation   PT Home Exercise Plan VCs for QS and SAQ   Consulted and Agree with Plan of Care Patient      Patient will benefit from skilled therapeutic intervention in order to improve the following deficits and impairments:  Abnormal gait, Decreased activity tolerance, Decreased mobility, Decreased range of motion, Decreased strength, Increased edema, Pain  Visit Diagnosis: Acute pain of left knee  Stiffness of left knee, not elsewhere classified  Localized edema  Muscle weakness (generalized)     Problem List Patient Active Problem List   Diagnosis Date Noted  . Attention deficit disorder (ADD) without hyperactivity 12/19/2016  . Acute recurrent frontal sinusitis 10/17/2016  . Tinnitus of both ears  10/17/2016  . Vertigo 10/17/2016  . Asthma 09/05/2016  . Body mass index 31.0-31.9, adult 06/09/2016  . Chronic cholecystitis with calculus 07/14/2011  . GERD (gastroesophageal reflux disease) 07/14/2011  . Diverticulosis of sigmoid colon 07/14/2011  . ESSENTIAL HYPERTENSION, BENIGN 06/07/2009  . ABDOMINAL PAIN, LEFT LOWER QUADRANT 06/07/2009    Wynelle Fanny, PTA 02/10/2017, 3:52 PM  [facility] [address] [place], [place], [address] Phone: [phone]   Fax:  [phone]  Name:  Deanna [name] MRN: [id number] Date of Birth: 1960-08-01

## 2017-02-13 ENCOUNTER — Ambulatory Visit: Payer: 59 | Admitting: Physical Therapy

## 2017-02-13 DIAGNOSIS — M6281 Muscle weakness (generalized): Secondary | ICD-10-CM

## 2017-02-13 DIAGNOSIS — M25562 Pain in left knee: Secondary | ICD-10-CM

## 2017-02-13 DIAGNOSIS — R600 Localized edema: Secondary | ICD-10-CM

## 2017-02-13 DIAGNOSIS — M25662 Stiffness of left knee, not elsewhere classified: Secondary | ICD-10-CM

## 2017-02-13 NOTE — Therapy (Addendum)
[facility] [place], [place], [address] Phone: [phone]   Fax:  [phone]  Physical Therapy Treatment  Patient Details  Name: Deanna [name] MRN: [id number] Date of Birth: [birth year] Referring Provider: Victorino December MD  Encounter Date: 02/13/2017      PT End of Session - 02/13/17 0941    Visit Number 8   Number of Visits 12   Date for PT Re-Evaluation 02/18/17   Authorization Type FOTO every 5th visit and/or at time of MD visit.   PT Start Time 0900   PT Stop Time 0954   PT Time Calculation (min) 54 min   Activity Tolerance Patient tolerated treatment well   Behavior During Therapy WFL for tasks assessed/performed      Past Medical History:  Diagnosis Date  . Allergy   . Anxiety   . Asthma   . Cancer ([place])    breast  . Depression   . Diverticulosis of sigmoid colon 07/14/2011  . GERD (gastroesophageal reflux disease)   . Hypertension   . Migraine   . PONV (postoperative nausea and vomiting)     Past Surgical History:  Procedure Laterality Date  . ABDOMINAL HYSTERECTOMY  02/2010   partial  . BREAST REDUCTION SURGERY  02/2011  . BREAST SURGERY     cancer  . CHOLECYSTECTOMY  07/17/11   w/IOC  . NASAL SINUS SURGERY  02/2011  . TONSILLECTOMY  age 85    There were no vitals filed for this visit.      Subjective Assessment - 02/13/17 0942    Subjective About the same.   Pain Score 4    Pain Location Knee   Pain Orientation Left   Pain Descriptors / Indicators Aching;Discomfort;Throbbing   Pain Type Surgical pain   Pain Onset More than a month ago    Treatment:  Stationary bike x 10 minutes f/b PROM to patient's left knee x 5 minutes f/b IFC (20 minutes) and vaspneumatic x 15 minutes.                                    PT Long Term Goals - 01/26/17 1236      PT LONG TERM GOAL #1   Title Independent with a HEP.   Time 4   Period Weeks   Status On-going     PT  LONG TERM GOAL #2   Title Full active left knee extension in order to normalize gait   Time 4   Period Weeks   Status On-going     PT LONG TERM GOAL #3   Title Active knee flexion to 120 degrees+ so the patient can perform functional tasks and do so with pain not > 2-3/10.   Time 4   Period Weeks   Status On-going     PT LONG TERM GOAL #4   Title Increase left knee and hip strength to a solid 4+/5 to provide good stability for accomplishment of functional activities   Time 4   Period Weeks   Status On-going     PT LONG TERM GOAL #5   Title Perform a reciprocating stair gait with one railing with pain not > 2-3/10.   Time 4   Period Weeks   Status On-going             Patient will benefit from skilled therapeutic intervention in order to improve the following deficits  and impairments:     Visit Diagnosis: Acute pain of left knee  Stiffness of left knee, not elsewhere classified  Localized edema  Muscle weakness (generalized)     Problem List Patient Active Problem List   Diagnosis Date Noted  . Attention deficit disorder (ADD) without hyperactivity 12/19/2016  . Acute recurrent frontal sinusitis 10/17/2016  . Tinnitus of both ears 10/17/2016  . Vertigo 10/17/2016  . Asthma 09/05/2016  . Body mass index 31.0-31.9, adult 06/09/2016  . Chronic cholecystitis with calculus 07/14/2011  . GERD (gastroesophageal reflux disease) 07/14/2011  . Diverticulosis of sigmoid colon 07/14/2011  . ESSENTIAL HYPERTENSION, BENIGN 06/07/2009  . ABDOMINAL PAIN, LEFT LOWER QUADRANT 06/07/2009    Henrick Mcgue, [place] MPT 02/13/2017, 9:54 AM  [facility] [address] [place], [place], [address] Phone: [phone]   Fax:  [phone]  Name: Deanna [name] MRN: [id number] Date of Birth: 12/22/1960

## 2017-02-17 ENCOUNTER — Ambulatory Visit: Payer: 59 | Admitting: *Deleted

## 2017-02-17 DIAGNOSIS — R600 Localized edema: Secondary | ICD-10-CM

## 2017-02-17 DIAGNOSIS — M25562 Pain in left knee: Secondary | ICD-10-CM | POA: Diagnosis not present

## 2017-02-17 DIAGNOSIS — M6281 Muscle weakness (generalized): Secondary | ICD-10-CM

## 2017-02-17 DIAGNOSIS — M25662 Stiffness of left knee, not elsewhere classified: Secondary | ICD-10-CM

## 2017-02-17 NOTE — Therapy (Signed)
[facility] [place], [place], [address] Phone: [phone]   Fax:  [phone]  Physical Therapy Treatment  Patient Details  Name: Deanna [name] MRN: [id number] Date of Birth: [birth year] Referring Provider: Victorino December MD  Encounter Date: 02/17/2017    Past Medical History:  Diagnosis Date  . Allergy   . Anxiety   . Asthma   . Cancer ([place])    breast  . Depression   . Diverticulosis of sigmoid colon 07/14/2011  . GERD (gastroesophageal reflux disease)   . Hypertension   . Migraine   . PONV (postoperative nausea and vomiting)     Past Surgical History:  Procedure Laterality Date  . ABDOMINAL HYSTERECTOMY  02/2010   partial  . BREAST REDUCTION SURGERY  02/2011  . BREAST SURGERY     cancer  . CHOLECYSTECTOMY  07/17/11   w/IOC  . NASAL SINUS SURGERY  02/2011  . TONSILLECTOMY  age 11    There were no vitals filed for this visit.      Subjective Assessment - 02/17/17 1133    Subjective To MD 02-24-17 . My knee is hurting 4-5/10 today medial and posterior aspect   Limitations Walking   How long can you walk comfortably? Short distances.   Patient Stated Goals Get back to normal and walk for exercise.   Currently in Pain? Yes   Pain Score 5    Pain Location Knee   Pain Orientation Left   Pain Descriptors / Indicators Aching   Pain Type Surgical pain   Pain Onset More than a month ago   Pain Frequency Constant                         [facility] Adult PT Treatment/Exercise - 02/17/17 0001      Exercises   Exercises Knee/Hip     Knee/Hip Exercises: Aerobic   Nustep L3 x20 min   LE's only     Knee/Hip Exercises: Standing   Hip Abduction --   Rocker Board --  calf stretching     Knee/Hip Exercises: Seated   Long Arc Quad Strengthening;Left  x 5 mins   Long Arc Quad Weight 3 lbs.     Knee/Hip Exercises: Supine   Short Arc Quad Sets Strengthening;Left  x 5 mins   Short Arc Quad Sets  Limitations 3     Modalities   Modalities Passenger transport manager Location L knee  IFC x 15 mins 1-10hz    Electrical Stimulation Goals Edema;Pain     Vasopneumatic   Number Minutes Vasopneumatic  15 minutes   Vasopnuematic Location  Knee   Vasopneumatic Pressure Medium   Vasopneumatic Temperature  36                     PT Long Term Goals - 01/26/17 1236      PT LONG TERM GOAL #1   Title Independent with a HEP.   Time 4   Period Weeks   Status On-going     PT LONG TERM GOAL #2   Title Full active left knee extension in order to normalize gait   Time 4   Period Weeks   Status On-going     PT LONG TERM GOAL #3   Title Active knee flexion to 120 degrees+ so the patient can perform functional tasks and do so with pain not > 2-3/10.  Time 4   Period Weeks   Status On-going     PT LONG TERM GOAL #4   Title Increase left knee and hip strength to a solid 4+/5 to provide good stability for accomplishment of functional activities   Time 4   Period Weeks   Status On-going     PT LONG TERM GOAL #5   Title Perform a reciprocating stair gait with one railing with pain not > 2-3/10.   Time 4   Period Weeks   Status On-going               Plan - 02/17/17 1156    Clinical Impression Statement Pt arrived to clinic today with increased pain in her LT knee especially posteriomedial. Rx focused on ROM and quad fatigue. She is still guarded during gait due to pain and fear of knee giving away. AROM to 102 degrees flexion   Clinical Presentation Stable   Rehab Potential Excellent   PT Frequency 3x / week   PT Duration 4 weeks   PT Treatment/Interventions ADLs/Self Care Home Management;Cryotherapy;Electrical Stimulation;Functional mobility training;Stair training;Gait training;Ultrasound;Therapeutic activities;Therapeutic exercise;Neuromuscular re-education;Patient/family education;Passive range of  motion;Manual techniques;Vasopneumatic Device   PT Next Visit Plan Continue with painfree L Quad strengthening per MPT POC with modalities PRN.   perform church pews, rock -backs, and standing TKEs for [employer] quad activation   Consulted and Agree with Plan of Care Patient      Patient will benefit from skilled therapeutic intervention in order to improve the following deficits and impairments:  Abnormal gait, Decreased activity tolerance, Decreased mobility, Decreased range of motion, Decreased strength, Increased edema, Pain  Visit Diagnosis: Acute pain of left knee  Stiffness of left knee, not elsewhere classified  Localized edema  Muscle weakness (generalized)     Problem List Patient Active Problem List   Diagnosis Date Noted  . Attention deficit disorder (ADD) without hyperactivity 12/19/2016  . Acute recurrent frontal sinusitis 10/17/2016  . Tinnitus of both ears 10/17/2016  . Vertigo 10/17/2016  . Asthma 09/05/2016  . Body mass index 31.0-31.9, adult 06/09/2016  . Chronic cholecystitis with calculus 07/14/2011  . GERD (gastroesophageal reflux disease) 07/14/2011  . Diverticulosis of sigmoid colon 07/14/2011  . ESSENTIAL HYPERTENSION, BENIGN 06/07/2009  . ABDOMINAL PAIN, LEFT LOWER QUADRANT 06/07/2009    Marquize Seib,CHRIS, PTA 02/17/2017, 12:19 PM  [facility] [address] [place], [place], [address] Phone: [phone]   Fax:  [phone]  Name: CHRISTIANNE ZACHER MRN: [id number] Date of Birth: 29-Aug-1960

## 2017-02-20 ENCOUNTER — Ambulatory Visit: Payer: 59 | Admitting: *Deleted

## 2017-02-20 DIAGNOSIS — M25562 Pain in left knee: Secondary | ICD-10-CM | POA: Diagnosis not present

## 2017-02-20 DIAGNOSIS — R600 Localized edema: Secondary | ICD-10-CM

## 2017-02-20 DIAGNOSIS — M25662 Stiffness of left knee, not elsewhere classified: Secondary | ICD-10-CM

## 2017-02-20 DIAGNOSIS — M6281 Muscle weakness (generalized): Secondary | ICD-10-CM

## 2017-02-20 NOTE — Therapy (Signed)
[facility] [place], [place], [address] Phone: [phone]   Fax:  [phone]  Physical Therapy Treatment  Patient Details  Name: Deanna [name] MRN: [id number] Date of Birth: 02-Jul-1961 Referring Provider: Victorino December MD  Encounter Date: 02/20/2017      PT End of Session - 02/20/17 0836    Visit Number 9   Number of Visits 12   Date for PT Re-Evaluation 02/18/17   Authorization Type FOTO every 5th visit and/or at time of MD visit.   PT Start Time 0900   PT Stop Time 0956   PT Time Calculation (min) 56 min      Past Medical History:  Diagnosis Date  . Allergy   . Anxiety   . Asthma   . Cancer ([place])    breast  . Depression   . Diverticulosis of sigmoid colon 07/14/2011  . GERD (gastroesophageal reflux disease)   . Hypertension   . Migraine   . PONV (postoperative nausea and vomiting)     Past Surgical History:  Procedure Laterality Date  . ABDOMINAL HYSTERECTOMY  02/2010   partial  . BREAST REDUCTION SURGERY  02/2011  . BREAST SURGERY     cancer  . CHOLECYSTECTOMY  07/17/11   w/IOC  . NASAL SINUS SURGERY  02/2011  . TONSILLECTOMY  age 60    There were no vitals filed for this visit.      Subjective Assessment - 02/20/17 0833    Subjective To MD 02-24-17 . My knee is hurting 4-5/10 today medial and posterior aspect   Limitations Walking   How long can you walk comfortably? Short distances.   Patient Stated Goals Get back to normal and walk for exercise.   Currently in Pain? Yes   Pain Score 5    Pain Location Knee   Pain Orientation Left   Pain Descriptors / Indicators Aching   Pain Onset More than a month ago   Pain Frequency Constant                         [facility] Adult PT Treatment/Exercise - 02/20/17 0001      Exercises   Exercises Knee/Hip     Knee/Hip Exercises: Aerobic   Nustep L4 x20 min   LE's only     Knee/Hip Exercises: Standing   Terminal Knee Extension  Strengthening;Left;3 sets;10 reps;Theraband   Theraband Level (Terminal Knee Extension) Level 3 (Green)     Knee/Hip Exercises: Seated   Long Arc Quad Strengthening;Left  x 5 mins   Long Arc Quad Weight 3 lbs.     Knee/Hip Exercises: Supine   Short Arc Quad Sets Strengthening;Left  x 20 pause 5 secs at top   [employer] Limitations 3     Modalities   Modalities Passenger transport manager Location L knee  IFC x 15 mins 1-10hz    Electrical Stimulation Goals Edema;Pain     Vasopneumatic   Number Minutes Vasopneumatic  15 minutes   Vasopnuematic Location  Knee   Vasopneumatic Pressure Medium   Vasopneumatic Temperature  36                     PT Long Term Goals - 01/26/17 1236      PT LONG TERM GOAL #1   Title Independent with a HEP.   Time 4   Period Weeks   Status  On-going     PT LONG TERM GOAL #2   Title Full active left knee extension in order to normalize gait   Time 4   Period Weeks   Status On-going     PT LONG TERM GOAL #3   Title Active knee flexion to 120 degrees+ so the patient can perform functional tasks and do so with pain not > 2-3/10.   Time 4   Period Weeks   Status On-going     PT LONG TERM GOAL #4   Title Increase left knee and hip strength to a solid 4+/5 to provide good stability for accomplishment of functional activities   Time 4   Period Weeks   Status On-going     PT LONG TERM GOAL #5   Title Perform a reciprocating stair gait with one railing with pain not > 2-3/10.   Time 4   Period Weeks   Status On-going               Plan - 02/20/17 1607    Clinical Impression Statement Pt arrived today doing fairly well, but continues to have medial and posterior knee pain. She was able to perform all therex for LT knee with minimal pain except  TKE.  PROM today was 0-120 degrees with end-range discomfort   Clinical Presentation Stable   Clinical  Decision Making Low   Rehab Potential Excellent   PT Frequency 3x / week   PT Duration 4 weeks   PT Treatment/Interventions ADLs/Self Care Home Management;Cryotherapy;Electrical Stimulation;Functional mobility training;Stair training;Gait training;Ultrasound;Therapeutic activities;Therapeutic exercise;Neuromuscular re-education;Patient/family education;Passive range of motion;Manual techniques;Vasopneumatic Device   PT Next Visit Plan Continue with painfree L Quad strengthening per MPT POC with modalities PRN.   perform church pews, rock -backs, and standing TKEs for [employer] quad activation   PT Home Exercise Plan VCs for QS and SAQ   Consulted and Agree with Plan of Care Patient      Patient will benefit from skilled therapeutic intervention in order to improve the following deficits and impairments:  Abnormal gait, Decreased activity tolerance, Decreased mobility, Decreased range of motion, Decreased strength, Increased edema, Pain  Visit Diagnosis: Acute pain of left knee  Stiffness of left knee, not elsewhere classified  Localized edema  Muscle weakness (generalized)     Problem List Patient Active Problem List   Diagnosis Date Noted  . Attention deficit disorder (ADD) without hyperactivity 12/19/2016  . Acute recurrent frontal sinusitis 10/17/2016  . Tinnitus of both ears 10/17/2016  . Vertigo 10/17/2016  . Asthma 09/05/2016  . Body mass index 31.0-31.9, adult 06/09/2016  . Chronic cholecystitis with calculus 07/14/2011  . GERD (gastroesophageal reflux disease) 07/14/2011  . Diverticulosis of sigmoid colon 07/14/2011  . ESSENTIAL HYPERTENSION, BENIGN 06/07/2009  . ABDOMINAL PAIN, LEFT LOWER QUADRANT 06/07/2009    Sosha Shepherd,CHRIS, PTA 02/20/2017, 9:29 AM  [facility] [place], [place], [address] Phone: [phone]   Fax:  [phone]  Name: Deanna [name] MRN: [id number] Date of Birth: 1961/07/25

## 2017-02-23 ENCOUNTER — Encounter: Payer: 59 | Admitting: Physical Therapy

## 2017-03-04 ENCOUNTER — Ambulatory Visit: Payer: 59 | Attending: Orthopedic Surgery | Admitting: Physical Therapy

## 2017-03-04 ENCOUNTER — Encounter: Payer: Self-pay | Admitting: Physical Therapy

## 2017-03-04 DIAGNOSIS — M25562 Pain in left knee: Secondary | ICD-10-CM

## 2017-03-04 DIAGNOSIS — M25662 Stiffness of left knee, not elsewhere classified: Secondary | ICD-10-CM | POA: Diagnosis present

## 2017-03-04 DIAGNOSIS — M6281 Muscle weakness (generalized): Secondary | ICD-10-CM

## 2017-03-04 DIAGNOSIS — R600 Localized edema: Secondary | ICD-10-CM | POA: Insufficient documentation

## 2017-03-04 NOTE — Patient Instructions (Addendum)
  Strengthening: Hip Abduction (Side-Lying)  Strengthening: Straight Leg Raise (Phase 1)  Repeat _10___ times per set. Do __2__ sets per session. Do __2__ sessions per day.  Pelvic Tilt: Posterior - Legs Bent (Supine)  Bridging   Slowly raise buttocks from floor, keeping stomach tight. Repeat _10___ times per set. Do __2__ sets per session. Do __2__ sessions per day.   Straight Leg Raise   Tighten stomach and slowly raise locked right leg __4__ inches from floor. Repeat __10-30__ times per set. Do __2__ sets per session. Do __2__ sessions per day.  stretch 30 sec x5-10 2-3 x daily   Terminal Knee Extension (Standing)    Facing anchor with right knee slightly bent and tubing just above knee, gently pull knee back straight. Do not overextend knee. Repeat __20-30__ times per set. Do _1___ sets per session. Do _1___ sessions per day.

## 2017-03-04 NOTE — Therapy (Signed)
[facility] [place], [place], [address] Phone: [phone]   Fax:  [phone]  Physical Therapy Treatment  Patient Details  Name: Deanna [name] MRN: [id number] Date of Birth: 1961/02/03 Referring Provider: Victorino December MD  Encounter Date: 03/04/2017      PT End of Session - 03/04/17 0830    Visit Number 10   Number of Visits 12   Date for PT Re-Evaluation 02/18/17   Authorization Type FOTO every 5th visit and/or at time of MD visit.   PT Start Time 0821   PT Stop Time 0916   PT Time Calculation (min) 55 min   Activity Tolerance Patient tolerated treatment well   Behavior During Therapy [facility] for tasks assessed/performed      Past Medical History:  Diagnosis Date  . Allergy   . Anxiety   . Asthma   . Cancer ([place])    breast  . Depression   . Diverticulosis of sigmoid colon 07/14/2011  . GERD (gastroesophageal reflux disease)   . Hypertension   . Migraine   . PONV (postoperative nausea and vomiting)     Past Surgical History:  Procedure Laterality Date  . ABDOMINAL HYSTERECTOMY  02/2010   partial  . BREAST REDUCTION SURGERY  02/2011  . BREAST SURGERY     cancer  . CHOLECYSTECTOMY  07/17/11   w/IOC  . NASAL SINUS SURGERY  02/2011  . TONSILLECTOMY  age 59    There were no vitals filed for this visit.      Subjective Assessment - 03/04/17 0823    Subjective Patient reported ongoing soreness in knee   Limitations Walking   How long can you walk comfortably? Short distances.   Patient Stated Goals Get back to normal and walk for exercise.   Currently in Pain? Yes   Pain Score 3    Pain Location Knee   Pain Orientation Left   Pain Descriptors / Indicators Aching   Pain Type Surgical pain   Pain Onset More than a month ago   Pain Frequency Constant   Aggravating Factors  prolong standing or walking   Pain Relieving Factors rest and ice            [facility] PT Assessment - 03/04/17 0001      AROM    AROM Assessment Site Knee   Right/Left Knee Left   Left Knee Extension 0   Left Knee Flexion 115     PROM   PROM Assessment Site Knee   Right/Left Knee Left   Left Knee Extension 0   Left Knee Flexion 120                     [facility] Adult PT Treatment/Exercise - 03/04/17 0001      Knee/Hip Exercises: Aerobic   Nustep L4 x20 min   LE's only     Knee/Hip Exercises: Standing   Lateral Step Up Left;2 sets;10 reps;Step Height: 6"   Forward Step Up Left;3 sets;10 reps;Step Height: 6"     Knee/Hip Exercises: Supine   Short Arc Quad Sets Strengthening;Left;3 sets;10 reps   Short Arc Quad Sets Limitations 3   Bridges Limitations x10   Straight Leg Raises Strengthening;Left;10 reps;1 set     Knee/Hip Exercises: Sidelying   Hip ABduction Strengthening;Left;1 set;10 reps     Acupuncturist Stimulation Location L knee  IFC x 15 mins 1-10hz    Electrical Stimulation Goals Edema;Pain  Vasopneumatic   Number Minutes Vasopneumatic  15 minutes   Vasopnuematic Location  Knee   Vasopneumatic Pressure Medium                PT Education - 03/04/17 0841    Education provided Yes   Education Details HEP with green t-band   Person(s) Educated Patient   Methods Explanation;Demonstration;Handout   Comprehension Verbalized understanding;Returned demonstration             PT Long Term Goals - 03/04/17 4431      PT LONG TERM GOAL #1   Title Independent with a HEP.   Time 4   Period Weeks   Status Achieved  03/04/17     PT LONG TERM GOAL #2   Title Full active left knee extension in order to normalize gait   Time 4   Period Weeks   Status Achieved  AROM 0 degrees 03/04/17     PT LONG TERM GOAL #3   Title Active knee flexion to 120 degrees+ so the patient can perform functional tasks and do so with pain not > 2-3/10.   Time 4   Period Weeks   Status On-going  AROM 115 degrees 03/04/17     PT LONG TERM GOAL #4   Title Increase left  knee and hip strength to a solid 4+/5 to provide good stability for accomplishment of functional activities   Time 4   Period Weeks   Status On-going     PT LONG TERM GOAL #5   Title Perform a reciprocating stair gait with one railing with pain not > 2-3/10.   Time 4   Period Weeks   Status On-going  4/10 pain 03/04/17               Plan - 03/04/17 0849    Clinical Impression Statement Patient tolerated treatment well today. Patient reportes ongoing swelling and increased pain with prolong walking or standing. Patient has difficulty descending stairs. Patient given HEP today and educated on elevation, and ice after activity. Patient has improved with ROM today. Patient FOTO inproved (initial 85% limitation) current 54% limitation. Patient met LTG #1 and #2 today other goals ongoing due to pain, strength and edema limitations.   Rehab Potential Excellent   PT Frequency 3x / week   PT Duration 4 weeks   PT Treatment/Interventions ADLs/Self Care Home Management;Cryotherapy;Electrical Stimulation;Functional mobility training;Stair training;Gait training;Ultrasound;Therapeutic activities;Therapeutic exercise;Neuromuscular re-education;Patient/family education;Passive range of motion;Manual techniques;Vasopneumatic Device   PT Next Visit Plan Continue with painfree L Quad strengthening per MPT POC with modalities PRN.   perform church pews, rock -backs, and standing TKEs for [employer] quad activation (follow up sent today to PT to send to MD)   Consulted and Agree with Plan of Care Patient      Patient will benefit from skilled therapeutic intervention in order to improve the following deficits and impairments:  Abnormal gait, Decreased activity tolerance, Decreased mobility, Decreased range of motion, Decreased strength, Increased edema, Pain  Visit Diagnosis: Acute pain of left knee  Stiffness of left knee, not elsewhere classified  Localized edema  Muscle weakness  (generalized)     Problem List Patient Active Problem List   Diagnosis Date Noted  . Attention deficit disorder (ADD) without hyperactivity 12/19/2016  . Acute recurrent frontal sinusitis 10/17/2016  . Tinnitus of both ears 10/17/2016  . Vertigo 10/17/2016  . Asthma 09/05/2016  . Body mass index 31.0-31.9, adult 06/09/2016  . Chronic cholecystitis with calculus 07/14/2011  .  GERD (gastroesophageal reflux disease) 07/14/2011  . Diverticulosis of sigmoid colon 07/14/2011  . ESSENTIAL HYPERTENSION, BENIGN 06/07/2009  . ABDOMINAL PAIN, LEFT LOWER QUADRANT 06/07/2009    Ladean Raya, PTA 03/04/17 9:23 AM  [facility] [place], [place], [address] Phone: [phone]   Fax:  [phone]  Name: Deanna [name] MRN: [id number] Date of Birth: Dec 22, 1960

## 2017-03-06 ENCOUNTER — Ambulatory Visit: Payer: 59 | Admitting: Physical Therapy

## 2017-03-06 ENCOUNTER — Encounter: Payer: Self-pay | Admitting: Physical Therapy

## 2017-03-06 DIAGNOSIS — M25562 Pain in left knee: Secondary | ICD-10-CM

## 2017-03-06 DIAGNOSIS — R600 Localized edema: Secondary | ICD-10-CM

## 2017-03-06 DIAGNOSIS — M25662 Stiffness of left knee, not elsewhere classified: Secondary | ICD-10-CM

## 2017-03-06 DIAGNOSIS — M6281 Muscle weakness (generalized): Secondary | ICD-10-CM

## 2017-03-06 NOTE — Therapy (Signed)
[facility] [place], [place], [address] Phone: [phone]   Fax:  [phone]  Physical Therapy Treatment  Patient Details  Name: Deanna [name] MRN: [id number] Date of Birth: 1961/06/01 Referring Provider: Victorino December MD  Encounter Date: 03/06/2017      PT End of Session - 03/06/17 1210    Visit Number 11   Number of Visits 12   Date for PT Re-Evaluation 02/18/17   Authorization Type FOTO every 5th visit and/or at time of MD visit.   PT Start Time 1122   PT Stop Time 1215   PT Time Calculation (min) 53 min   Activity Tolerance Patient tolerated treatment well   Behavior During Therapy WFL for tasks assessed/performed      Past Medical History:  Diagnosis Date  . Allergy   . Anxiety   . Asthma   . Cancer ([place])    breast  . Depression   . Diverticulosis of sigmoid colon 07/14/2011  . GERD (gastroesophageal reflux disease)   . Hypertension   . Migraine   . PONV (postoperative nausea and vomiting)     Past Surgical History:  Procedure Laterality Date  . ABDOMINAL HYSTERECTOMY  02/2010   partial  . BREAST REDUCTION SURGERY  02/2011  . BREAST SURGERY     cancer  . CHOLECYSTECTOMY  07/17/11   w/IOC  . NASAL SINUS SURGERY  02/2011  . TONSILLECTOMY  age 27    There were no vitals filed for this visit.          [facility] PT Assessment - 03/06/17 0001      AROM   AROM Assessment Site Knee   Right/Left Knee Left   Left Knee Extension 0   Left Knee Flexion 122                     [facility] Adult PT Treatment/Exercise - 03/06/17 0001      Exercises   Exercises Knee/Hip     Knee/Hip Exercises: Aerobic   Nustep L4 x 15 min   LE's only     Knee/Hip Exercises: Machines for Strengthening   Cybex Leg Press 1 plate 30 reps x 3 sets     Knee/Hip Exercises: Standing   Lateral Step Up Left;2 sets;10 reps;Step Height: 8"   Forward Step Up Left;3 sets;10 reps;Step Height: 8"   Rocker Board 3  minutes     Knee/Hip Exercises: Supine   Bridges Limitations x 15   Straight Leg Raises Strengthening;Left;10 reps;1 set     Modalities   Modalities Passenger transport manager Location L knee  IFC x 15 mins 1-10hz    Electrical Stimulation Goals Edema;Pain     Vasopneumatic   Number Minutes Vasopneumatic  15 minutes   Vasopnuematic Location  Knee   Vasopneumatic Pressure Medium   Vasopneumatic Temperature  38                     PT Long Term Goals - 03/06/17 1213      PT LONG TERM GOAL #1   Title Independent with a HEP.   Status Achieved     PT LONG TERM GOAL #2   Title Full active left knee extension in order to normalize gait   Period Weeks   Status Achieved     PT LONG TERM GOAL #3   Title Active knee flexion to 120 degrees+ so the patient can  perform functional tasks and do so with pain not > 2-3/10.   Period Weeks   Status On-going     PT LONG TERM GOAL #4   Title Increase left knee and hip strength to a solid 4+/5 to provide good stability for accomplishment of functional activities   Time 4   Period Weeks   Status On-going     PT LONG TERM GOAL #5   Title Perform a reciprocating stair gait with one railing with pain not > 2-3/10.   Time 4   Period Weeks   Status On-going               Plan - 03/06/17 1211    Clinical Impression Statement Patient tolerated treatment well today. Still complaining of stiffness and mild swelling, Discussed ice and elevation. Continue skilled PT to progress toward pt's goals set.    Rehab Potential Excellent   PT Frequency 3x / week   PT Duration 4 weeks   PT Treatment/Interventions ADLs/Self Care Home Management;Cryotherapy;Electrical Stimulation;Functional mobility training;Stair training;Gait training;Ultrasound;Therapeutic activities;Therapeutic exercise;Neuromuscular re-education;Patient/family education;Passive range of motion;Manual  techniques;Vasopneumatic Device   PT Next Visit Plan Continue with painfree L Quad strengthening per MPT POC with modalities PRN.   perform church pews, rock -backs, and standing TKEs for [employer] quad activation (follow up sent today to PT to send to MD)   PT Home Exercise Plan VCs for QS and SAQ   Consulted and Agree with Plan of Care Patient      Patient will benefit from skilled therapeutic intervention in order to improve the following deficits and impairments:  Abnormal gait, Decreased activity tolerance, Decreased mobility, Decreased range of motion, Decreased strength, Increased edema, Pain  Visit Diagnosis: Acute pain of left knee  Stiffness of left knee, not elsewhere classified  Localized edema  Muscle weakness (generalized)     Problem List Patient Active Problem List   Diagnosis Date Noted  . Attention deficit disorder (ADD) without hyperactivity 12/19/2016  . Acute recurrent frontal sinusitis 10/17/2016  . Tinnitus of both ears 10/17/2016  . Vertigo 10/17/2016  . Asthma 09/05/2016  . Body mass index 31.0-31.9, adult 06/09/2016  . Chronic cholecystitis with calculus 07/14/2011  . GERD (gastroesophageal reflux disease) 07/14/2011  . Diverticulosis of sigmoid colon 07/14/2011  . ESSENTIAL HYPERTENSION, BENIGN 06/07/2009  . ABDOMINAL PAIN, LEFT LOWER QUADRANT 06/07/2009    Deanna [name] , MPT 03/06/2017, 12:16 PM  [facility] [address] [place], [place], [address] Phone: [phone]   Fax:  [phone]  Name: Deanna [name] MRN: [id number] Date of Birth: November 17, 1960

## 2017-03-10 ENCOUNTER — Ambulatory Visit: Payer: 59 | Admitting: *Deleted

## 2017-03-10 DIAGNOSIS — M6281 Muscle weakness (generalized): Secondary | ICD-10-CM

## 2017-03-10 DIAGNOSIS — M25562 Pain in left knee: Secondary | ICD-10-CM | POA: Diagnosis not present

## 2017-03-10 DIAGNOSIS — M25662 Stiffness of left knee, not elsewhere classified: Secondary | ICD-10-CM

## 2017-03-10 DIAGNOSIS — R600 Localized edema: Secondary | ICD-10-CM

## 2017-03-10 NOTE — Therapy (Signed)
[facility] [place], [place], [address] Phone: [phone]   Fax:  [phone]  Physical Therapy Treatment  Patient Details  Name: Deanna [name] MRN: [id number] Date of Birth: [birth year] Referring Provider: Victorino December MD  Encounter Date: 03/10/2017      PT End of Session - 03/10/17 0951    Visit Number 12   Number of Visits 12   Authorization Type FOTO every 5th visit and/or at time of MD visit.   PT Start Time 0900   PT Stop Time [phone]   PT Time Calculation (min) 52 min   Activity Tolerance Patient tolerated treatment well   Behavior During Therapy WFL for tasks assessed/performed      Past Medical History:  Diagnosis Date  . Allergy   . Anxiety   . Asthma   . Cancer ([place])    breast  . Depression   . Diverticulosis of sigmoid colon 07/14/2011  . GERD (gastroesophageal reflux disease)   . Hypertension   . Migraine   . PONV (postoperative nausea and vomiting)     Past Surgical History:  Procedure Laterality Date  . ABDOMINAL HYSTERECTOMY  02/2010   partial  . BREAST REDUCTION SURGERY  02/2011  . BREAST SURGERY     cancer  . CHOLECYSTECTOMY  07/17/11   w/IOC  . NASAL SINUS SURGERY  02/2011  . TONSILLECTOMY  age 48    There were no vitals filed for this visit.      Subjective Assessment - 03/10/17 0925    Subjective Patient reported ongoing soreness in knee   Limitations Walking   How long can you walk comfortably? Short distances.   Patient Stated Goals Get back to normal and walk for exercise.   Currently in Pain? Yes   Pain Score 3    Pain Location Knee   Pain Orientation Left   Pain Type Surgical pain   Pain Onset More than a month ago   Pain Frequency Constant                         [facility] Adult PT Treatment/Exercise - 03/10/17 0001      Exercises   Exercises Knee/Hip     Knee/Hip Exercises: Aerobic   Nustep L4 x 20  min   LE's only     Knee/Hip Exercises:  Standing   Terminal Knee Extension Strengthening;Left;3 sets;10 reps;Theraband     Knee/Hip Exercises: Seated   Long Arc Quad Strengthening;Left;20 reps;10 reps     Knee/Hip Exercises: Supine   Short Arc Quad Sets Strengthening;Left;3 sets;10 reps     Acupuncturist Location L knee  IFC x 15 mins 1-10hz    Electrical Stimulation Goals Edema;Pain     Vasopneumatic   Number Minutes Vasopneumatic  15 minutes   Vasopnuematic Location  Knee   Vasopneumatic Pressure Medium   Vasopneumatic Temperature  38                     PT Long Term Goals - 03/06/17 1213      PT LONG TERM GOAL #1   Title Independent with a HEP.   Status Achieved     PT LONG TERM GOAL #2   Title Full active left knee extension in order to normalize gait   Period Weeks   Status Achieved     PT LONG TERM GOAL #3   Title Active knee flexion to 120 degrees+  so the patient can perform functional tasks and do so with pain not > 2-3/10.   Period Weeks   Status On-going     PT LONG TERM GOAL #4   Title Increase left knee and hip strength to a solid 4+/5 to provide good stability for accomplishment of functional activities   Time 4   Period Weeks   Status On-going     PT LONG TERM GOAL #5   Title Perform a reciprocating stair gait with one railing with pain not > 2-3/10.   Time 4   Period Weeks   Status On-going               Plan - 03/10/17 5320    Clinical Impression Statement Pt arrived today doing better with less pain and gaurding in LT knee. She is able to perform quad sets with less pain now and much better with TKEs. Normal modality response   Rehab Potential Excellent   PT Frequency 3x / week   PT Duration 4 weeks   PT Treatment/Interventions ADLs/Self Care Home Management;Cryotherapy;Electrical Stimulation;Functional mobility training;Stair training;Gait training;Ultrasound;Therapeutic activities;Therapeutic exercise;Neuromuscular  re-education;Patient/family education;Passive range of motion;Manual techniques;Vasopneumatic Device   PT Next Visit Plan Continue with painfree L Quad strengthening per MPT POC with modalities PRN.   perform church pews, rock -backs, and standing TKEs for [employer] quad activation (follow up sent today to PT to send to MD)   PT Home Exercise Plan     Consulted and Agree with Plan of Care Patient      Patient will benefit from skilled therapeutic intervention in order to improve the following deficits and impairments:  Abnormal gait, Decreased activity tolerance, Decreased mobility, Decreased range of motion, Decreased strength, Increased edema, Pain  Visit Diagnosis: Acute pain of left knee  Stiffness of left knee, not elsewhere classified  Localized edema  Muscle weakness (generalized)     Problem List Patient Active Problem List   Diagnosis Date Noted  . Attention deficit disorder (ADD) without hyperactivity 12/19/2016  . Acute recurrent frontal sinusitis 10/17/2016  . Tinnitus of both ears 10/17/2016  . Vertigo 10/17/2016  . Asthma 09/05/2016  . Body mass index 31.0-31.9, adult 06/09/2016  . Chronic cholecystitis with calculus 07/14/2011  . GERD (gastroesophageal reflux disease) 07/14/2011  . Diverticulosis of sigmoid colon 07/14/2011  . ESSENTIAL HYPERTENSION, BENIGN 06/07/2009  . ABDOMINAL PAIN, LEFT LOWER QUADRANT 06/07/2009    RAMSEUR,CHRIS, PTA 03/10/2017, 11:07 AM  [facility] [place], [place], [address] Phone: [phone]   Fax:  [phone]  Name: Deanna [name] MRN: [id number] Date of Birth: 1961/01/22

## 2017-03-12 ENCOUNTER — Encounter: Payer: Self-pay | Admitting: Physical Therapy

## 2017-03-12 ENCOUNTER — Ambulatory Visit: Payer: 59 | Admitting: Physical Therapy

## 2017-03-12 DIAGNOSIS — R600 Localized edema: Secondary | ICD-10-CM

## 2017-03-12 DIAGNOSIS — M25662 Stiffness of left knee, not elsewhere classified: Secondary | ICD-10-CM

## 2017-03-12 DIAGNOSIS — M25562 Pain in left knee: Secondary | ICD-10-CM

## 2017-03-12 DIAGNOSIS — M6281 Muscle weakness (generalized): Secondary | ICD-10-CM

## 2017-03-12 NOTE — Therapy (Signed)
[facility] [place], [place], [address] Phone: [phone]   Fax:  [phone]  Physical Therapy Treatment  Patient Details  Name: Deanna [name] MRN: [id number] Date of Birth: [birth year] Referring Provider: Victorino December MD  Encounter Date: 03/12/2017      PT End of Session - 03/12/17 0930    Visit Number 13   Number of Visits 24   Date for PT Re-Evaluation 04/07/17   Authorization Type FOTO every 5th visit and/or at time of MD visit.   PT Start Time 0901   PT Stop Time 0947   PT Time Calculation (min) 46 min   Activity Tolerance Patient tolerated treatment well   Behavior During Therapy [facility] for tasks assessed/performed      Past Medical History:  Diagnosis Date  . Allergy   . Anxiety   . Asthma   . Cancer ([place])    breast  . Depression   . Diverticulosis of sigmoid colon 07/14/2011  . GERD (gastroesophageal reflux disease)   . Hypertension   . Migraine   . PONV (postoperative nausea and vomiting)     Past Surgical History:  Procedure Laterality Date  . ABDOMINAL HYSTERECTOMY  02/2010   partial  . BREAST REDUCTION SURGERY  02/2011  . BREAST SURGERY     cancer  . CHOLECYSTECTOMY  07/17/11   w/IOC  . NASAL SINUS SURGERY  02/2011  . TONSILLECTOMY  age 43    There were no vitals filed for this visit.      Subjective Assessment - 03/12/17 0917    Subjective Patient reported ongoing soreness in knee and had increased pain after last treatment for unknown reason   Limitations Walking   How long can you walk comfortably? Short distances.   Patient Stated Goals Get back to normal and walk for exercise.   Currently in Pain? Yes   Pain Score 2    Pain Location Knee   Pain Orientation Left   Pain Descriptors / Indicators Aching   Pain Type Surgical pain   Pain Onset More than a month ago   Pain Frequency Constant   Aggravating Factors  prolong activity   Pain Relieving Factors rest and ice             [facility] PT Assessment - 03/12/17 0001      AROM   AROM Assessment Site Knee   Right/Left Knee Left   Left Knee Flexion 113                     [facility] Adult PT Treatment/Exercise - 03/12/17 0001      Knee/Hip Exercises: Aerobic   Nustep L4 x 20  min   LE's only     Knee/Hip Exercises: Standing   Lateral Step Up Left;2 sets;10 reps;Step Height: 8"   Forward Step Up Left;3 sets;10 reps;Step Height: 8"     Knee/Hip Exercises: Seated   Long Arc Quad Strengthening;Left;2 sets;10 reps   Long Arc Quad Weight 3 lbs.     Knee/Hip Exercises: Supine   Short Arc Quad Sets Strengthening;Left;2 sets;10 reps   Short Arc Quad Sets Limitations 3   Straight Leg Raises Strengthening;Left;10 reps;2 sets     Knee/Hip Exercises: Sidelying   Hip ABduction Strengthening;Left;10 reps;2 sets     Acupuncturist Stimulation Location L knee  IFC x 15 mins 1-10hz    Electrical Stimulation Goals Edema;Pain     Vasopneumatic   Number Minutes Vasopneumatic  15 minutes   Vasopnuematic Location  Knee   Vasopneumatic Pressure Medium                     PT Long Term Goals - 03/06/17 1213      PT LONG TERM GOAL #1   Title Independent with a HEP.   Status Achieved     PT LONG TERM GOAL #2   Title Full active left knee extension in order to normalize gait   Period Weeks   Status Achieved     PT LONG TERM GOAL #3   Title Active knee flexion to 120 degrees+ so the patient can perform functional tasks and do so with pain not > 2-3/10.   Period Weeks   Status On-going     PT LONG TERM GOAL #4   Title Increase left knee and hip strength to a solid 4+/5 to provide good stability for accomplishment of functional activities   Time 4   Period Weeks   Status On-going     PT LONG TERM GOAL #5   Title Perform a reciprocating stair gait with one railing with pain not > 2-3/10.   Time 4   Period Weeks   Status On-going               Plan  - 03/12/17 0939    Clinical Impression Statement Patient tolerated treatment without complaints today. Patient has reported increased pain after increased activity such as moderate ADL's or prolong standing/walking. Patient feels improvement overall. FOTO current 52% limitation (initial 85%) Goals progressing. Strength , pain and ROM limitations.    Rehab Potential Excellent   PT Frequency 3x / week   PT Duration 4 weeks   PT Treatment/Interventions ADLs/Self Care Home Management;Cryotherapy;Electrical Stimulation;Functional mobility training;Stair training;Gait training;Ultrasound;Therapeutic activities;Therapeutic exercise;Neuromuscular re-education;Patient/family education;Passive range of motion;Manual techniques;Vasopneumatic Device   PT Next Visit Plan Continue with painfree L Quad strengthening per MPT POC with modalities PRN.   perform church pews, rock -backs, and standing TKEs for [employer] quad activation MD.Jason Rogers appt next week, note sent today   Consulted and Agree with Plan of Care Patient      Patient will benefit from skilled therapeutic intervention in order to improve the following deficits and impairments:  Abnormal gait, Decreased activity tolerance, Decreased mobility, Decreased range of motion, Decreased strength, Increased edema, Pain  Visit Diagnosis: Acute pain of left knee  Stiffness of left knee, not elsewhere classified  Localized edema  Muscle weakness (generalized)     Problem List Patient Active Problem List   Diagnosis Date Noted  . Attention deficit disorder (ADD) without hyperactivity 12/19/2016  . Acute recurrent frontal sinusitis 10/17/2016  . Tinnitus of both ears 10/17/2016  . Vertigo 10/17/2016  . Asthma 09/05/2016  . Body mass index 31.0-31.9, adult 06/09/2016  . Chronic cholecystitis with calculus 07/14/2011  . GERD (gastroesophageal reflux disease) 07/14/2011  . Diverticulosis of sigmoid colon 07/14/2011  . ESSENTIAL HYPERTENSION, BENIGN  06/07/2009  . ABDOMINAL PAIN, LEFT LOWER QUADRANT 06/07/2009    Ladean Raya, PTA 03/12/17 12:37 PM Mali Applegate MPT [facility] [address] [place], [place], [address] Phone: [phone]   Fax:  [phone]  Name: Deanna [name] MRN: [id number] Date of Birth: December 26, 1960

## 2017-03-16 ENCOUNTER — Other Ambulatory Visit: Payer: Self-pay | Admitting: Physician Assistant

## 2017-03-17 NOTE — Telephone Encounter (Signed)
rx called into [facility]

## 2017-03-18 ENCOUNTER — Encounter: Payer: Self-pay | Admitting: Physical Therapy

## 2017-03-18 ENCOUNTER — Ambulatory Visit: Payer: 59 | Admitting: Physical Therapy

## 2017-03-18 DIAGNOSIS — R600 Localized edema: Secondary | ICD-10-CM

## 2017-03-18 DIAGNOSIS — M25562 Pain in left knee: Secondary | ICD-10-CM

## 2017-03-18 DIAGNOSIS — M25662 Stiffness of left knee, not elsewhere classified: Secondary | ICD-10-CM

## 2017-03-18 DIAGNOSIS — M6281 Muscle weakness (generalized): Secondary | ICD-10-CM

## 2017-03-18 NOTE — Therapy (Signed)
[facility] [place], [place], [address] Phone: [phone]   Fax:  [phone]  Physical Therapy Treatment  Patient Details  Name: Deanna [name] MRN: [id number] Date of Birth: 1961-01-19 Referring Provider: Victorino December MD  Encounter Date: 03/18/2017      PT End of Session - 03/18/17 0912    Visit Number 14   Number of Visits 24   Date for PT Re-Evaluation 04/07/17   Authorization Type FOTO every 5th visit and/or at time of MD visit.   PT Start Time 0908   PT Stop Time 0955   PT Time Calculation (min) 47 min   Activity Tolerance Patient tolerated treatment well   Behavior During Therapy [facility] for tasks assessed/performed      Past Medical History:  Diagnosis Date  . Allergy   . Anxiety   . Asthma   . Cancer ([place])    breast  . Depression   . Diverticulosis of sigmoid colon 07/14/2011  . GERD (gastroesophageal reflux disease)   . Hypertension   . Migraine   . PONV (postoperative nausea and vomiting)     Past Surgical History:  Procedure Laterality Date  . ABDOMINAL HYSTERECTOMY  02/2010   partial  . BREAST REDUCTION SURGERY  02/2011  . BREAST SURGERY     cancer  . CHOLECYSTECTOMY  07/17/11   w/IOC  . NASAL SINUS SURGERY  02/2011  . TONSILLECTOMY  age 37    There were no vitals filed for this visit.      Subjective Assessment - 03/18/17 0909    Subjective Reports that they are going to draw the fluid out of the cyst in posterior knee. Reports that she is also out of work for four more weeks.   Limitations Walking   How long can you walk comfortably? Short distances.   Patient Stated Goals Get back to normal and walk for exercise.   Currently in Pain? Yes   Pain Score 2    Pain Location Knee   Pain Orientation Left   Pain Descriptors / Indicators Discomfort   Pain Type Surgical pain   Pain Onset More than a month ago            [facility] PT Assessment - 03/18/17 0001      Assessment   Medical Diagnosis Left knee medial meniscectomy.   Onset Date/Surgical Date 01/12/17   Next MD Visit 04/21/2017     Restrictions   Weight Bearing Restrictions No                     [facility] Adult PT Treatment/Exercise - 03/18/17 0001      Knee/Hip Exercises: Aerobic   Nustep L6 x10 min LEs only     Knee/Hip Exercises: Machines for Strengthening   Cybex Leg Press 2 pl, seat 7, 3x10 reps     Knee/Hip Exercises: Standing   Terminal Knee Extension Strengthening;Left;2 sets;10 reps   Terminal Knee Extension Limitations Pink XTS x20 reps   Lateral Step Up Left;3 sets;10 reps;Hand Hold: 2;Step Height: 6"   Step Down Left;2 sets;10 reps;Hand Hold: 2;Step Height: 4"     Knee/Hip Exercises: Seated   Long Arc Quad Strengthening;Left;2 sets;10 reps   Long Arc Quad Weight 4 lbs.     Knee/Hip Exercises: Supine   Straight Leg Raises Strengthening;Left;10 reps;2 sets     Modalities   Modalities Electrical Stimulation;Vasopneumatic     Electrical Stimulation   Electrical Stimulation Location L knee  Electrical Stimulation Action IFC   Electrical Stimulation Parameters 1--10 hz x15 min   Electrical Stimulation Goals Edema;Pain     Vasopneumatic   Number Minutes Vasopneumatic  15 minutes   Vasopnuematic Location  Knee   Vasopneumatic Pressure Medium   Vasopneumatic Temperature  34                     PT Long Term Goals - 03/06/17 1213      PT LONG TERM GOAL #1   Title Independent with a HEP.   Status Achieved     PT LONG TERM GOAL #2   Title Full active left knee extension in order to normalize gait   Period Weeks   Status Achieved     PT LONG TERM GOAL #3   Title Active knee flexion to 120 degrees+ so the patient can perform functional tasks and do so with pain not > 2-3/10.   Period Weeks   Status On-going     PT LONG TERM GOAL #4   Title Increase left knee and hip strength to a solid 4+/5 to provide good stability for accomplishment of functional  activities   Time 4   Period Weeks   Status On-going     PT LONG TERM GOAL #5   Title Perform a reciprocating stair gait with one railing with pain not > 2-3/10.   Time 4   Period Weeks   Status On-going               Plan - 03/18/17 0957    Clinical Impression Statement Patient tolerated today's treatment well with intermittant reports of pain. Patient still experiences difficulty with prolonged walking thus more quad strengthening exercises completed for greater stability. Fatigue noted with SLR and discomfort reported with LAQ. Normal modalities response noted following removal of the modalities.   Rehab Potential Excellent   PT Frequency 3x / week   PT Duration 4 weeks   PT Treatment/Interventions ADLs/Self Care Home Management;Cryotherapy;Electrical Stimulation;Functional mobility training;Stair training;Gait training;Ultrasound;Therapeutic activities;Therapeutic exercise;Neuromuscular re-education;Patient/family education;Passive range of motion;Manual techniques;Vasopneumatic Device   PT Next Visit Plan Continue L Quad strengthening with modalities PRN per MPT POC.   Consulted and Agree with Plan of Care Patient      Patient will benefit from skilled therapeutic intervention in order to improve the following deficits and impairments:  Abnormal gait, Decreased activity tolerance, Decreased mobility, Decreased range of motion, Decreased strength, Increased edema, Pain  Visit Diagnosis: Acute pain of left knee  Stiffness of left knee, not elsewhere classified  Localized edema  Muscle weakness (generalized)     Problem List Patient Active Problem List   Diagnosis Date Noted  . Attention deficit disorder (ADD) without hyperactivity 12/19/2016  . Acute recurrent frontal sinusitis 10/17/2016  . Tinnitus of both ears 10/17/2016  . Vertigo 10/17/2016  . Asthma 09/05/2016  . Body mass index 31.0-31.9, adult 06/09/2016  . Chronic cholecystitis with calculus  07/14/2011  . GERD (gastroesophageal reflux disease) 07/14/2011  . Diverticulosis of sigmoid colon 07/14/2011  . ESSENTIAL HYPERTENSION, BENIGN 06/07/2009  . ABDOMINAL PAIN, LEFT LOWER QUADRANT 06/07/2009    Wynelle Fanny, PTA 03/18/2017, 10:04 AM  [facility] [address] [place], [place], [address] Phone: [phone]   Fax:  [phone]  Name: Deanna [name] MRN: [id number] Date of Birth: [birth year]

## 2017-03-23 ENCOUNTER — Encounter: Payer: Self-pay | Admitting: Physical Therapy

## 2017-03-23 ENCOUNTER — Ambulatory Visit: Payer: 59 | Admitting: Physician Assistant

## 2017-03-23 ENCOUNTER — Ambulatory Visit: Payer: 59 | Admitting: Physical Therapy

## 2017-03-23 DIAGNOSIS — M25562 Pain in left knee: Secondary | ICD-10-CM

## 2017-03-23 DIAGNOSIS — M6281 Muscle weakness (generalized): Secondary | ICD-10-CM

## 2017-03-23 DIAGNOSIS — R600 Localized edema: Secondary | ICD-10-CM

## 2017-03-23 DIAGNOSIS — M25662 Stiffness of left knee, not elsewhere classified: Secondary | ICD-10-CM

## 2017-03-23 NOTE — Therapy (Signed)
[facility] [place], [place], [address] Phone: [phone]   Fax:  [phone]  Physical Therapy Treatment  Patient Details  Name: Deanna [name] MRN: [id number] Date of Birth: 1961-07-19 Referring Provider: Victorino December MD  Encounter Date: 03/23/2017      PT End of Session - 03/23/17 0758    Visit Number 15   Number of Visits 24   Date for PT Re-Evaluation 04/07/17   Authorization Type FOTO every 5th visit and/or at time of MD visit.   PT Start Time 0737   PT Stop Time 0819   PT Time Calculation (min) 42 min   Activity Tolerance Patient tolerated treatment well   Behavior During Therapy [facility] for tasks assessed/performed      Past Medical History:  Diagnosis Date  . Allergy   . Anxiety   . Asthma   . Cancer ([place])    breast  . Depression   . Diverticulosis of sigmoid colon 07/14/2011  . GERD (gastroesophageal reflux disease)   . Hypertension   . Migraine   . PONV (postoperative nausea and vomiting)     Past Surgical History:  Procedure Laterality Date  . ABDOMINAL HYSTERECTOMY  02/2010   partial  . BREAST REDUCTION SURGERY  02/2011  . BREAST SURGERY     cancer  . CHOLECYSTECTOMY  07/17/11   w/IOC  . NASAL SINUS SURGERY  02/2011  . TONSILLECTOMY  age 56    There were no vitals filed for this visit.      Subjective Assessment - 03/23/17 0739    Subjective Patient reported doing a little better this morning   Limitations Walking   How long can you walk comfortably? Short distances.   Patient Stated Goals Get back to normal and walk for exercise.   Currently in Pain? Yes   Pain Score 2    Pain Location Knee   Pain Orientation Left   Pain Descriptors / Indicators Discomfort   Pain Type Surgical pain   Pain Onset More than a month ago   Pain Frequency Intermittent   Aggravating Factors  prolong activity   Pain Relieving Factors rest and ice            [facility] PT Assessment - 03/23/17 0001      AROM   AROM Assessment Site Knee   Right/Left Knee Left   Left Knee Flexion 118     PROM   PROM Assessment Site Knee   Right/Left Knee Left   Left Knee Flexion 120                     [facility] Adult PT Treatment/Exercise - 03/23/17 0001      Knee/Hip Exercises: Aerobic   Recumbent Bike L1 x21min     Knee/Hip Exercises: Standing   Lateral Step Up Left;3 sets;10 reps;Hand Hold: 2;Step Height: 6"   Forward Step Up Left;3 sets;10 reps;Step Height: 6"   Step Down Left;2 sets;10 reps;Hand Hold: 2;Step Height: 4"     Knee/Hip Exercises: Seated   Long Arc Quad Strengthening;Left;2 sets;10 reps   Long Arc Quad Weight 4 lbs.     Academic librarian IFC   Electrical Stimulation Parameters 1-10hz  x14min   Electrical Stimulation Goals Edema;Pain     Vasopneumatic   Number Minutes Vasopneumatic  15 minutes   Vasopnuematic Location  Knee   Vasopneumatic Pressure Medium  PT Long Term Goals - 03/06/17 1213      PT LONG TERM GOAL #1   Title Independent with a HEP.   Status Achieved     PT LONG TERM GOAL #2   Title Full active left knee extension in order to normalize gait   Period Weeks   Status Achieved     PT LONG TERM GOAL #3   Title Active knee flexion to 120 degrees+ so the patient can perform functional tasks and do so with pain not > 2-3/10.   Period Weeks   Status On-going     PT LONG TERM GOAL #4   Title Increase left knee and hip strength to a solid 4+/5 to provide good stability for accomplishment of functional activities   Time 4   Period Weeks   Status On-going     PT LONG TERM GOAL #5   Title Perform a reciprocating stair gait with one railing with pain not > 2-3/10.   Time 4   Period Weeks   Status On-going               Plan - 03/23/17 0805    Clinical Impression Statement Patient tolerated treatment well today yet with  ongoing discomfort in left knee esp with prolong activity. Patient has improved AROM in left knee. Patient has dificulty with ADL's and prolong walking due to ongoing throbbing pain reported. FOTO 61% limitation today. Goals ongoing due to pain and strength deficts.    Rehab Potential Excellent   PT Frequency 3x / week   PT Duration 4 weeks   PT Treatment/Interventions ADLs/Self Care Home Management;Cryotherapy;Electrical Stimulation;Functional mobility training;Stair training;Gait training;Ultrasound;Therapeutic activities;Therapeutic exercise;Neuromuscular re-education;Patient/family education;Passive range of motion;Manual techniques;Vasopneumatic Device   PT Next Visit Plan Continue L Quad strengthening with modalities PRN per MPT POC.   Consulted and Agree with Plan of Care Patient      Patient will benefit from skilled therapeutic intervention in order to improve the following deficits and impairments:  Abnormal gait, Decreased activity tolerance, Decreased mobility, Decreased range of motion, Decreased strength, Increased edema, Pain  Visit Diagnosis: Acute pain of left knee  Stiffness of left knee, not elsewhere classified  Localized edema  Muscle weakness (generalized)     Problem List Patient Active Problem List   Diagnosis Date Noted  . Attention deficit disorder (ADD) without hyperactivity 12/19/2016  . Acute recurrent frontal sinusitis 10/17/2016  . Tinnitus of both ears 10/17/2016  . Vertigo 10/17/2016  . Asthma 09/05/2016  . Body mass index 31.0-31.9, adult 06/09/2016  . Chronic cholecystitis with calculus 07/14/2011  . GERD (gastroesophageal reflux disease) 07/14/2011  . Diverticulosis of sigmoid colon 07/14/2011  . ESSENTIAL HYPERTENSION, BENIGN 06/07/2009  . ABDOMINAL PAIN, LEFT LOWER QUADRANT 06/07/2009    Deanna [name] P, PTA 03/23/2017, 8:19 AM  [facility] [place], [place], [address] Phone:  [phone]   Fax:  [phone]  Name: Deanna [name] MRN: [id number] Date of Birth: April 10, 1961

## 2017-03-25 ENCOUNTER — Ambulatory Visit: Payer: 59 | Admitting: Physical Therapy

## 2017-03-25 ENCOUNTER — Encounter: Payer: Self-pay | Admitting: Physical Therapy

## 2017-03-25 DIAGNOSIS — M25562 Pain in left knee: Secondary | ICD-10-CM | POA: Diagnosis not present

## 2017-03-25 DIAGNOSIS — R600 Localized edema: Secondary | ICD-10-CM

## 2017-03-25 DIAGNOSIS — M25662 Stiffness of left knee, not elsewhere classified: Secondary | ICD-10-CM

## 2017-03-25 DIAGNOSIS — M6281 Muscle weakness (generalized): Secondary | ICD-10-CM

## 2017-03-25 NOTE — Therapy (Signed)
[facility] [place], [place], [address] Phone: [phone]   Fax:  [phone]  Physical Therapy Treatment  Patient Details  Name: Deanna [name] MRN: [id number] Date of Birth: [birth year] Referring Provider: Victorino December MD  Encounter Date: 03/25/2017      PT End of Session - 03/25/17 0915    Visit Number 16   Number of Visits 24   Date for PT Re-Evaluation 04/07/17   Authorization Type FOTO every 5th visit and/or at time of MD visit.   PT Start Time 0911   PT Stop Time 1020   PT Time Calculation (min) 69 min   Activity Tolerance Patient tolerated treatment well   Behavior During Therapy WFL for tasks assessed/performed      Past Medical History:  Diagnosis Date  . Allergy   . Anxiety   . Asthma   . Cancer ([place])    breast  . Depression   . Diverticulosis of sigmoid colon 07/14/2011  . GERD (gastroesophageal reflux disease)   . Hypertension   . Migraine   . PONV (postoperative nausea and vomiting)     Past Surgical History:  Procedure Laterality Date  . ABDOMINAL HYSTERECTOMY  02/2010   partial  . BREAST REDUCTION SURGERY  02/2011  . BREAST SURGERY     cancer  . CHOLECYSTECTOMY  07/17/11   w/IOC  . NASAL SINUS SURGERY  02/2011  . TONSILLECTOMY  age 41    There were no vitals filed for this visit.      Subjective Assessment - 03/25/17 0912    Subjective Patient reports that she has began walking a mile a day.   Limitations Walking   How long can you walk comfortably? Short distances.   Patient Stated Goals Get back to normal and walk for exercise.   Currently in Pain? Yes   Pain Score 2    Pain Location Knee   Pain Orientation Left   Pain Descriptors / Indicators Discomfort   Pain Type Surgical pain   Pain Onset More than a month ago            [facility] PT Assessment - 03/25/17 0001      Assessment   Medical Diagnosis Left knee medial meniscectomy.   Onset Date/Surgical Date 01/12/17   Next MD Visit 04/21/2017     Restrictions   Weight Bearing Restrictions No                     [facility] Adult PT Treatment/Exercise - 03/25/17 0001      Knee/Hip Exercises: Aerobic   Stationary Bike L1 x12 min, seat 2     Knee/Hip Exercises: Machines for Strengthening   Cybex Leg Press 2 pl, seat 7, 3x10 reps     Knee/Hip Exercises: Standing   Terminal Knee Extension Strengthening;Left;2 sets;10 reps   Terminal Knee Extension Limitations Pink XTS x20 reps   Forward Step Up Left;3 sets;10 reps;Step Height: 8";Hand Hold: 2   Step Down Left;3 sets;10 reps;Hand Hold: 2;Step Height: 6"     Knee/Hip Exercises: Supine   Straight Leg Raises Strengthening;Left;10 reps;2 sets     Modalities   Modalities Electrical Stimulation;Vasopneumatic     Acupuncturist Location L knee   Electrical Stimulation Action IFC   Electrical Stimulation Parameters 1-10hz  x15 min   Electrical Stimulation Goals Edema;Pain     Vasopneumatic   Number Minutes Vasopneumatic  15 minutes   Vasopnuematic Location  Knee  Vasopneumatic Pressure Medium   Vasopneumatic Temperature  55                     PT Long Term Goals - 03/06/17 1213      PT LONG TERM GOAL #1   Title Independent with a HEP.   Status Achieved     PT LONG TERM GOAL #2   Title Full active left knee extension in order to normalize gait   Period Weeks   Status Achieved     PT LONG TERM GOAL #3   Title Active knee flexion to 120 degrees+ so the patient can perform functional tasks and do so with pain not > 2-3/10.   Period Weeks   Status On-going     PT LONG TERM GOAL #4   Title Increase left knee and hip strength to a solid 4+/5 to provide good stability for accomplishment of functional activities   Time 4   Period Weeks   Status On-going     PT LONG TERM GOAL #5   Title Perform a reciprocating stair gait with one railing with pain not > 2-3/10.   Time 4   Period Weeks    Status On-going               Plan - 03/25/17 1006    Clinical Impression Statement Patient tolerated today's treatment well as she continues to experience tired sensation with prolonged ambulation. Patient able to complete exercises as directed with patient reporting muscle contraction. No complaints of pain reported by patient with exercises. Strengthening completed to reduce fatigue sensation with prolonged ambulation and improve L knee stability. Normal modalities response noted following removal of the modalities.   Rehab Potential Excellent   PT Frequency 3x / week   PT Duration 4 weeks   PT Treatment/Interventions ADLs/Self Care Home Management;Cryotherapy;Electrical Stimulation;Functional mobility training;Stair training;Gait training;Ultrasound;Therapeutic activities;Therapeutic exercise;Neuromuscular re-education;Patient/family education;Passive range of motion;Manual techniques;Vasopneumatic Device   PT Next Visit Plan Continue L Quad strengthening with modalities PRN per MPT POC.   Consulted and Agree with Plan of Care Patient      Patient will benefit from skilled therapeutic intervention in order to improve the following deficits and impairments:  Abnormal gait, Decreased activity tolerance, Decreased mobility, Decreased range of motion, Decreased strength, Increased edema, Pain  Visit Diagnosis: Acute pain of left knee  Stiffness of left knee, not elsewhere classified  Localized edema  Muscle weakness (generalized)     Problem List Patient Active Problem List   Diagnosis Date Noted  . Attention deficit disorder (ADD) without hyperactivity 12/19/2016  . Acute recurrent frontal sinusitis 10/17/2016  . Tinnitus of both ears 10/17/2016  . Vertigo 10/17/2016  . Asthma 09/05/2016  . Body mass index 31.0-31.9, adult 06/09/2016  . Chronic cholecystitis with calculus 07/14/2011  . GERD (gastroesophageal reflux disease) 07/14/2011  . Diverticulosis of sigmoid colon  07/14/2011  . ESSENTIAL HYPERTENSION, BENIGN 06/07/2009  . ABDOMINAL PAIN, LEFT LOWER QUADRANT 06/07/2009    Wynelle Fanny, PTA 03/25/2017, 10:37 AM  [facility] [place], [place], [address] Phone: [phone]   Fax:  [phone]  Name: Deanna [name] MRN: [id number] Date of Birth: 1961-02-15

## 2017-04-01 ENCOUNTER — Ambulatory Visit: Payer: 59 | Attending: Orthopedic Surgery | Admitting: Physical Therapy

## 2017-04-01 ENCOUNTER — Encounter: Payer: Self-pay | Admitting: Physical Therapy

## 2017-04-01 ENCOUNTER — Ambulatory Visit: Payer: 59 | Admitting: Physician Assistant

## 2017-04-01 DIAGNOSIS — R600 Localized edema: Secondary | ICD-10-CM | POA: Insufficient documentation

## 2017-04-01 DIAGNOSIS — M25662 Stiffness of left knee, not elsewhere classified: Secondary | ICD-10-CM | POA: Diagnosis present

## 2017-04-01 DIAGNOSIS — M25562 Pain in left knee: Secondary | ICD-10-CM | POA: Insufficient documentation

## 2017-04-01 DIAGNOSIS — M6281 Muscle weakness (generalized): Secondary | ICD-10-CM

## 2017-04-01 NOTE — Therapy (Signed)
[facility] [place], [place], [address] Phone: [phone]   Fax:  [phone]  Physical Therapy Treatment  Patient Details  Name: Deanna [name] MRN: [id number] Date of Birth: 12-27-1960 Referring Provider: Victorino December MD  Encounter Date: 04/01/2017      PT End of Session - 04/01/17 0836    Visit Number 17   Number of Visits 24   Date for PT Re-Evaluation 04/07/17   Authorization Type FOTO every 5th visit and/or at time of MD visit.   PT Start Time 0822   PT Stop Time 0916   PT Time Calculation (min) 54 min   Activity Tolerance Patient tolerated treatment well   Behavior During Therapy [facility] for tasks assessed/performed      Past Medical History:  Diagnosis Date  . Allergy   . Anxiety   . Asthma   . Cancer ([place])    breast  . Depression   . Diverticulosis of sigmoid colon 07/14/2011  . GERD (gastroesophageal reflux disease)   . Hypertension   . Migraine   . PONV (postoperative nausea and vomiting)     Past Surgical History:  Procedure Laterality Date  . ABDOMINAL HYSTERECTOMY  02/2010   partial  . BREAST REDUCTION SURGERY  02/2011  . BREAST SURGERY     cancer  . CHOLECYSTECTOMY  07/17/11   w/IOC  . NASAL SINUS SURGERY  02/2011  . TONSILLECTOMY  age 18    There were no vitals filed for this visit.      Subjective Assessment - 04/01/17 0823    Subjective Reports that she an appointment today to aspirate cyst in posterior L knee.   Limitations Walking   How long can you walk comfortably? Short distances.   Patient Stated Goals Get back to normal and walk for exercise.   Currently in Pain? No/denies            [facility] PT Assessment - 04/01/17 0001      Assessment   Medical Diagnosis Left knee medial meniscectomy.   Onset Date/Surgical Date 01/12/17   Next MD Visit 04/21/2017  04/01/2017 for bakers cyst aspiration     Restrictions   Weight Bearing Restrictions No                      [facility] Adult PT Treatment/Exercise - 04/01/17 0001      Knee/Hip Exercises: Aerobic   Stationary Bike L2 x15 min     Knee/Hip Exercises: Standing   Terminal Knee Extension Strengthening;Left;2 sets;10 reps   Terminal Knee Extension Limitations Green theraband   Step Down Left;3 sets;10 reps;Hand Hold: 2;Step Height: 6"   Step Down Limitations heel dot     Knee/Hip Exercises: Supine   Straight Leg Raises Strengthening;Left;10 reps;2 sets   Straight Leg Raise with External Rotation Strengthening;Left;2 sets;10 reps     Knee/Hip Exercises: Sidelying   Hip ABduction Strengthening;Left;10 reps;2 sets     Modalities   Modalities Electrical Stimulation;Vasopneumatic     Electrical Stimulation   Electrical Stimulation Location L knee   Electrical Stimulation Action IFC   Electrical Stimulation Parameters 1-10 hz x15 min   Electrical Stimulation Goals Edema;Pain     Vasopneumatic   Number Minutes Vasopneumatic  15 minutes   Vasopnuematic Location  Knee   Vasopneumatic Pressure Medium   Vasopneumatic Temperature  57                     PT Long  Term Goals - 03/06/17 1213      PT LONG TERM GOAL #1   Title Independent with a HEP.   Status Achieved     PT LONG TERM GOAL #2   Title Full active left knee extension in order to normalize gait   Period Weeks   Status Achieved     PT LONG TERM GOAL #3   Title Active knee flexion to 120 degrees+ so the patient can perform functional tasks and do so with pain not > 2-3/10.   Period Weeks   Status On-going     PT LONG TERM GOAL #4   Title Increase left knee and hip strength to a solid 4+/5 to provide good stability for accomplishment of functional activities   Time 4   Period Weeks   Status On-going     PT LONG TERM GOAL #5   Title Perform a reciprocating stair gait with one railing with pain not > 2-3/10.   Time 4   Period Weeks   Status On-going               Plan - 04/01/17 0955    Clinical  Impression Statement Patient presented in clinic with reports of no current L knee pain but had appointment today for baker's cyst aspiration. Greatest discomfort reported today following stationary bike and L heel dot. Patient reported no other occurances of pain during exercise only fatigue with exercises. Normal modalities response noted following removal of the modalities.   Rehab Potential Excellent   PT Frequency 3x / week   PT Duration 4 weeks   PT Treatment/Interventions ADLs/Self Care Home Management;Cryotherapy;Electrical Stimulation;Functional mobility training;Stair training;Gait training;Ultrasound;Therapeutic activities;Therapeutic exercise;Neuromuscular re-education;Patient/family education;Passive range of motion;Manual techniques;Vasopneumatic Device   PT Next Visit Plan Continue L Quad strengthening with modalities PRN per MPT POC.   Consulted and Agree with Plan of Care Patient      Patient will benefit from skilled therapeutic intervention in order to improve the following deficits and impairments:  Abnormal gait, Decreased activity tolerance, Decreased mobility, Decreased range of motion, Decreased strength, Increased edema, Pain  Visit Diagnosis: Acute pain of left knee  Stiffness of left knee, not elsewhere classified  Localized edema  Muscle weakness (generalized)     Problem List Patient Active Problem List   Diagnosis Date Noted  . Attention deficit disorder (ADD) without hyperactivity 12/19/2016  . Acute recurrent frontal sinusitis 10/17/2016  . Tinnitus of both ears 10/17/2016  . Vertigo 10/17/2016  . Asthma 09/05/2016  . Body mass index 31.0-31.9, adult 06/09/2016  . Chronic cholecystitis with calculus 07/14/2011  . GERD (gastroesophageal reflux disease) 07/14/2011  . Diverticulosis of sigmoid colon 07/14/2011  . ESSENTIAL HYPERTENSION, BENIGN 06/07/2009  . ABDOMINAL PAIN, LEFT LOWER QUADRANT 06/07/2009    Wynelle Fanny, PTA 04/01/2017, 10:16  AM  [facility] [address] [place], [place], [address] Phone: [phone]   Fax:  [phone]  Name: Deanna [name] MRN: [id number] Date of Birth: 11/05/1960

## 2017-04-03 ENCOUNTER — Encounter: Payer: 59 | Admitting: Physical Therapy

## 2017-04-06 ENCOUNTER — Encounter: Payer: 59 | Admitting: Physical Therapy

## 2017-04-08 ENCOUNTER — Encounter: Payer: Self-pay | Admitting: Physical Therapy

## 2017-04-08 ENCOUNTER — Ambulatory Visit: Payer: 59 | Admitting: Physical Therapy

## 2017-04-08 DIAGNOSIS — M25562 Pain in left knee: Secondary | ICD-10-CM | POA: Diagnosis not present

## 2017-04-08 DIAGNOSIS — M6281 Muscle weakness (generalized): Secondary | ICD-10-CM

## 2017-04-08 DIAGNOSIS — R600 Localized edema: Secondary | ICD-10-CM

## 2017-04-08 DIAGNOSIS — M25662 Stiffness of left knee, not elsewhere classified: Secondary | ICD-10-CM

## 2017-04-08 NOTE — Therapy (Addendum)
[facility] [address] [place], [place], [address] Phone: [phone]   Fax:  [phone]  Physical Therapy Treatment  Patient Details  Name: Deanna [name] MRN: [id number] Date of Birth: 02/24/1961 Referring Provider: Jason Rogers MD  Encounter Date: 04/08/2017      PT End of Session - 04/08/17 1051    Visit Number 18   Number of Visits 24   Date for PT Re-Evaluation 04/07/17   Authorization Type FOTO every 5th visit and/or at time of MD visit.   PT Start Time 0905   PT Stop Time 1001   PT Time Calculation (min) 56 min   Activity Tolerance Patient tolerated treatment well   Behavior During Therapy WFL for tasks assessed/performed      Past Medical History:  Diagnosis Date  . Allergy   . Anxiety   . Asthma   . Cancer ([facility])    breast  . Depression   . Diverticulosis of sigmoid colon 07/14/2011  . GERD (gastroesophageal reflux disease)   . Hypertension   . Migraine   . PONV (postoperative nausea and vomiting)     Past Surgical History:  Procedure Laterality Date  . ABDOMINAL HYSTERECTOMY  02/2010   partial  . BREAST REDUCTION SURGERY  02/2011  . BREAST SURGERY     cancer  . CHOLECYSTECTOMY  07/17/11   w/IOC  . NASAL SINUS SURGERY  02/2011  . TONSILLECTOMY  age 34    There were no vitals filed for this visit.      Subjective Assessment - 04/08/17 1055    Subjective Knee much better since aspiration.   Pain Score 1    Pain Location Knee   Pain Orientation Left   Pain Descriptors / Indicators Discomfort   Pain Type Surgical pain   Pain Onset More than a month ago            [facility] PT Assessment - 04/08/17 0001      AROM   Left Knee Extension --  Full extension.   Left Knee Flexion --  128 degrees of left knee flexion.                     [facility] Adult PT Treatment/Exercise - 04/08/17 0001      Exercises   Exercises Knee/Hip     Knee/Hip Exercises: Aerobic   Stationary Bike Level 2  x 17 minutes.     Modalities   Modalities Electrical Stimulation;Moist Heat     Moist Heat Therapy   Number Minutes Moist Heat 20 Minutes   Moist Heat Location --  Left knee.     Electrical Stimulation   Electrical Stimulation Location --  Left knee.   Electrical Stimulation Action IFC x 20 minutes.   Electrical Stimulation Goals Pain     Manual Therapy   Manual Therapy Passive ROM   Passive ROM Left patellar mobs and PROM including left hamstring stretching x 7 minutes.                     PT Long Term Goals - 03/06/17 1213      PT LONG TERM GOAL #1   Title Independent with a HEP.   Status Achieved     PT LONG TERM GOAL #2   Title Full active left knee extension in order to normalize gait   Period Weeks   Status Achieved     PT LONG TERM GOAL #3   Title Active   knee flexion to 120 degrees+ so the patient can perform functional tasks and do so with pain not > 2-3/10.   Period Weeks   Status On-going     PT LONG TERM GOAL #4   Title Increase left knee and hip strength to a solid 4+/5 to provide good stability for accomplishment of functional activities   Time 4   Period Weeks   Status On-going     PT LONG TERM GOAL #5   Title Perform a reciprocating stair gait with one railing with pain not > 2-3/10.   Time 4   Period Weeks   Status On-going               Plan - 04/08/17 1102    Clinical Impression Statement No pain reported after treatment.  She demonstrated full left knee ROM today.   PT Next Visit Plan Please do FOTO next visit.   Consulted and Agree with Plan of Care Patient      Patient will benefit from skilled therapeutic intervention in order to improve the following deficits and impairments:     Visit Diagnosis: Acute pain of left knee  Stiffness of left knee, not elsewhere classified  Localized edema  Muscle weakness (generalized)     Problem List Patient Active Problem List   Diagnosis Date Noted  . Attention  deficit disorder (ADD) without hyperactivity 12/19/2016  . Acute recurrent frontal sinusitis 10/17/2016  . Tinnitus of both ears 10/17/2016  . Vertigo 10/17/2016  . Asthma 09/05/2016  . Body mass index 31.0-31.9, adult 06/09/2016  . Chronic cholecystitis with calculus 07/14/2011  . GERD (gastroesophageal reflux disease) 07/14/2011  . Diverticulosis of sigmoid colon 07/14/2011  . ESSENTIAL HYPERTENSION, BENIGN 06/07/2009  . ABDOMINAL PAIN, LEFT LOWER QUADRANT 06/07/2009    APPLEGATE, CHAD  MPT 04/08/2017, 11:06 AM  [facility] [address] [place], [place], [address] Phone: [phone]   Fax:  [phone]  Name: Deanna [name] MRN: [id number] Date of Birth: 04/12/1961  PHYSICAL THERAPY DISCHARGE SUMMARY  Visits from Start of Care: 18.  Current functional level related to goals / functional outcomes: See above.   Remaining deficits: See below.   Education / Equipment: HEP. Plan: Patient agrees to discharge.  Patient goals were not met. Patient is being discharged due to not returning since the last visit.  ?????         Chad Applegate MPT  

## 2017-04-21 ENCOUNTER — Encounter: Payer: 59 | Admitting: Physical Therapy

## 2017-04-21 ENCOUNTER — Other Ambulatory Visit: Payer: Self-pay | Admitting: Physician Assistant

## 2017-04-21 DIAGNOSIS — W57XXXA Bitten or stung by nonvenomous insect and other nonvenomous arthropods, initial encounter: Secondary | ICD-10-CM

## 2017-04-22 NOTE — Telephone Encounter (Signed)
Last seen 12/30/16  [facility]

## 2017-05-11 ENCOUNTER — Other Ambulatory Visit: Payer: Self-pay | Admitting: Physician Assistant

## 2017-05-12 NOTE — Telephone Encounter (Signed)
Rx called to pharmacy

## 2017-06-22 ENCOUNTER — Other Ambulatory Visit: Payer: Self-pay | Admitting: Physician Assistant

## 2017-06-22 DIAGNOSIS — I10 Essential (primary) hypertension: Secondary | ICD-10-CM

## 2017-06-22 NOTE — Telephone Encounter (Signed)
Phoned in.

## 2017-07-26 ENCOUNTER — Other Ambulatory Visit: Payer: Self-pay | Admitting: Physician Assistant

## 2017-07-31 ENCOUNTER — Ambulatory Visit: Payer: 59 | Admitting: Physician Assistant

## 2017-07-31 ENCOUNTER — Encounter: Payer: Self-pay | Admitting: Physician Assistant

## 2017-07-31 VITALS — BP 133/83 | HR 83 | Temp 98.7°F | Ht 66.0 in | Wt 204.2 lb

## 2017-07-31 DIAGNOSIS — F900 Attention-deficit hyperactivity disorder, predominantly inattentive type: Secondary | ICD-10-CM

## 2017-07-31 DIAGNOSIS — J45909 Unspecified asthma, uncomplicated: Secondary | ICD-10-CM | POA: Insufficient documentation

## 2017-07-31 DIAGNOSIS — J40 Bronchitis, not specified as acute or chronic: Secondary | ICD-10-CM

## 2017-07-31 DIAGNOSIS — Z1211 Encounter for screening for malignant neoplasm of colon: Secondary | ICD-10-CM | POA: Insufficient documentation

## 2017-07-31 DIAGNOSIS — K5792 Diverticulitis of intestine, part unspecified, without perforation or abscess without bleeding: Secondary | ICD-10-CM | POA: Diagnosis not present

## 2017-07-31 DIAGNOSIS — J209 Acute bronchitis, unspecified: Secondary | ICD-10-CM | POA: Insufficient documentation

## 2017-07-31 MED ORDER — LISDEXAMFETAMINE DIMESYLATE 60 MG PO CAPS: 60.0000 mg | ORAL_CAPSULE | ORAL | 0 refills | Status: DC

## 2017-07-31 MED ORDER — METHYLPREDNISOLONE ACETATE 80 MG/ML IJ SUSP
80.0000 mg | Freq: Once | INTRAMUSCULAR | Status: AC
  Administered 2017-07-31: 80 mg via INTRAMUSCULAR

## 2017-07-31 MED ORDER — ALPRAZOLAM 1 MG PO TABS: ORAL_TABLET | ORAL | 5 refills | Status: DC

## 2017-07-31 MED ORDER — LISDEXAMFETAMINE DIMESYLATE 60 MG PO CAPS
60.0000 mg | ORAL_CAPSULE | ORAL | 0 refills | Status: DC
Start: 2017-07-31 — End: 2017-11-12

## 2017-07-31 MED ORDER — LEVOFLOXACIN 750 MG PO TABS: 750.0000 mg | ORAL_TABLET | Freq: Every day | ORAL | 0 refills | Status: DC

## 2017-07-31 MED ORDER — HYDROCOD POLST-CPM POLST ER 10-8 MG/5ML PO SUER: 5.0000 mL | Freq: Two times a day (BID) | ORAL | 0 refills | Status: DC | PRN

## 2017-07-31 MED ORDER — LISDEXAMFETAMINE DIMESYLATE 60 MG PO CAPS
60.0000 mg | ORAL_CAPSULE | ORAL | 0 refills | Status: DC
Start: 2017-07-31 — End: 2017-09-28

## 2017-07-31 NOTE — Patient Instructions (Signed)
In a few days you may receive a survey in the mail or online from [employer] regarding your visit with us today. Please take a moment to fill this out. Your feedback is very important to our whole office. It can help us better understand your needs as well as improve your experience and satisfaction. Thank you for taking your time to complete it. We care about you.  Dev Dhondt, PA-C  

## 2017-07-31 NOTE — Progress Notes (Signed)
fmla 1 year asthma and bronchitis occurrences     BP 133/83   Pulse 83   Temp 98.7 F (37.1 C) (Oral)   Ht 5\' 6"  (1.676 m)   Wt 204 lb 3.2 oz (92.6 kg)   BMI 32.96 kg/m    Subjective:    Patient ID: Deanna [name], female    DOB: April 02, 1961, 57 y.o.   MRN: [id number]  HPI: Deanna [name] is a 57 y.o. female presenting on 07/31/2017 for Diverticulitis; Cough; and head congestion  Patient with several days of progressing upper respiratory and bronchial symptoms. Initially there was more upper respiratory congestion. This progressed to having significant cough that is productive throughout the day and severe at night. There is occasional wheezing after coughing. Sometimes there is slight dyspnea on exertion. It is productive mucus that is yellow in color. Denies any blood.  FMLA 1 year asthma and bronchitis occurrences, intermittent and recurrent.   Pain in abdomen from diarrhea and episode of diverticulitis. Due colonoscopy.  Relevant past medical, surgical, family and social history reviewed and updated as indicated. Allergies and medications reviewed and updated.  Past Medical History:  Diagnosis Date  . Allergy   . Anxiety   . Asthma   . Cancer ([place])    breast  . Depression   . Diverticulosis of sigmoid colon 07/14/2011  . GERD (gastroesophageal reflux disease)   . Hypertension   . Migraine   . PONV (postoperative nausea and vomiting)     Past Surgical History:  Procedure Laterality Date  . ABDOMINAL HYSTERECTOMY  02/2010   partial  . BREAST REDUCTION SURGERY  02/2011  . BREAST SURGERY     cancer  . CHOLECYSTECTOMY  07/17/11   w/IOC  . NASAL SINUS SURGERY  02/2011  . TONSILLECTOMY  age 67    Review of Systems  Constitutional: Positive for chills and fatigue. Negative for activity change and appetite change.  HENT: Positive for congestion, postnasal drip and sore throat.   Eyes: Negative.   Respiratory: Positive for cough and wheezing.   Cardiovascular:  Negative.  Negative for chest pain, palpitations and leg swelling.  Gastrointestinal: Negative.   Genitourinary: Negative.   Musculoskeletal: Positive for arthralgias and back pain.  Skin: Negative.   Neurological: Positive for headaches.    Allergies as of 07/31/2017      Reactions   Iohexol Anaphylaxis   IBD dye      Medication List        Accurate as of 07/31/17  2:04 PM. Always use your most recent med list.          ALPRAZolam 1 MG tablet Commonly known as:  XANAX TAKE (1) TABLET DAILY AT BEDTIME.   buPROPion 150 MG 24 hr tablet Commonly known as:  WELLBUTRIN XL Take 3 tablets (450 mg total) by mouth daily.   chlorpheniramine-HYDROcodone 10-8 MG/5ML Suer Commonly known as:  TUSSIONEX Take 5 mLs by mouth every 12 (twelve) hours as needed for cough.   diphenoxylate-atropine 2.5-0.025 MG tablet Commonly known as:  LOMOTIL Take 1 tablet by mouth 4 (four) times daily as needed. DIARRHEA   Fluticasone-Salmeterol 250-50 MCG/DOSE Aepb Commonly known as:  ADVAIR DISKUS Inhale 1 puff into the lungs 2 (two) times daily.   furosemide 20 MG tablet Commonly known as:  LASIX Take 1 tablet (20 mg total) by mouth daily.   levofloxacin 750 MG tablet Commonly known as:  LEVAQUIN Take 1 tablet (750 mg total) by mouth daily.   lisdexamfetamine 60  MG capsule Commonly known as:  VYVANSE Take 1 capsule (60 mg total) by mouth every morning.   lisdexamfetamine 60 MG capsule Commonly known as:  VYVANSE Take 1 capsule (60 mg total) by mouth every morning.   lisdexamfetamine 60 MG capsule Commonly known as:  VYVANSE Take 1 capsule (60 mg total) by mouth every morning.   meclizine 25 MG tablet Commonly known as:  ANTIVERT Take 1 tablet (25 mg total) by mouth 3 (three) times daily as needed for dizziness.   montelukast 10 MG tablet Commonly known as:  SINGULAIR TAKE 1 TABLET ONCE DAILY IN THE EVENING.   olmesartan 40 MG tablet Commonly known as:  BENICAR TAKE 1 TABLET DAILY  IN THE MORNING.   phentermine 37.5 MG tablet Commonly known as:  ADIPEX-P TAKE (1) TABLET DAILY IN THE MORNING.   SUMAtriptan 100 MG tablet Commonly known as:  IMITREX TAKE 1 TABLET ONCE DAILY AS NEEDED FOR MIGRAINE AS DIRECTED BY PHYSICIAN.   valACYclovir 1000 MG tablet Commonly known as:  VALTREX TAKE  (1)  TABLET  EVERY TWELVE HOURS.   VENTOLIN HFA 108 (90 Base) MCG/ACT inhaler Generic drug:  albuterol Inhale 2 puffs into the lungs every 6 (six) hours as needed for wheezing or shortness of breath.          Objective:    BP 133/83   Pulse 83   Temp 98.7 F (37.1 C) (Oral)   Ht 5\' 6"  (1.676 m)   Wt 204 lb 3.2 oz (92.6 kg)   BMI 32.96 kg/m   Allergies  Allergen Reactions  . Iohexol Anaphylaxis    IBD dye    Physical Exam  Constitutional: She is oriented to person, place, and time. She appears well-developed and well-nourished.  HENT:  Head: Normocephalic and atraumatic.  Right Ear: There is drainage and tenderness.  Left Ear: There is drainage and tenderness.  Nose: Mucosal edema and rhinorrhea present. Right sinus exhibits no maxillary sinus tenderness and no frontal sinus tenderness. Left sinus exhibits no maxillary sinus tenderness and no frontal sinus tenderness.  Mouth/Throat: Oropharyngeal exudate and posterior oropharyngeal erythema present.  Eyes: Conjunctivae and EOM are normal. Pupils are equal, round, and reactive to light.  Neck: Normal range of motion. Neck supple.  Cardiovascular: Normal rate, regular rhythm, normal heart sounds and intact distal pulses.  Pulmonary/Chest: Effort normal. She has wheezes in the right upper field and the left upper field.  Abdominal: Soft. Bowel sounds are normal.  Neurological: She is alert and oriented to person, place, and time. She has normal reflexes.  Skin: Skin is warm and dry. No rash noted.  Psychiatric: She has a normal mood and affect. Her behavior is normal. Judgment and thought content normal.  Nursing  note and vitals reviewed.       Assessment & Plan:   1. Colon cancer screening - Ambulatory referral to Gastroenterology  2. Bronchitis - levofloxacin (LEVAQUIN) 750 MG tablet; Take 1 tablet (750 mg total) by mouth daily.  Dispense: 14 tablet; Refill: 0 - chlorpheniramine-HYDROcodone (TUSSIONEX) 10-8 MG/5ML SUER; Take 5 mLs by mouth every 12 (twelve) hours as needed for cough.  Dispense: 120 mL; Refill: 0 - methylPREDNISolone acetate (DEPO-MEDROL) injection 80 mg  FMLA 1 year asthma and bronchitis occurrences, intermittent and recurrent.    3. Diverticulitis  4. Attention deficit hyperactivity disorder (ADHD), predominantly inattentive type - lisdexamfetamine (VYVANSE) 60 MG capsule; Take 1 capsule (60 mg total) by mouth every morning.  Dispense: 30 capsule; Refill: 0 - lisdexamfetamine (  VYVANSE) 60 MG capsule; Take 1 capsule (60 mg total) by mouth every morning.  Dispense: 30 capsule; Refill: 0 - lisdexamfetamine (VYVANSE) 60 MG capsule; Take 1 capsule (60 mg total) by mouth every morning.  Dispense: 30 capsule; Refill: 0    Current Outpatient Medications:  .  ALPRAZolam (XANAX) 1 MG tablet, TAKE (1) TABLET DAILY AT BEDTIME., Disp: 30 tablet, Rfl: 5 .  buPROPion (WELLBUTRIN XL) 150 MG 24 hr tablet, Take 3 tablets (450 mg total) by mouth daily., Disp: 90 tablet, Rfl: 11 .  chlorpheniramine-HYDROcodone (TUSSIONEX) 10-8 MG/5ML SUER, Take 5 mLs by mouth every 12 (twelve) hours as needed for cough., Disp: 120 mL, Rfl: 0 .  diphenoxylate-atropine (LOMOTIL) 2.5-0.025 MG per tablet, Take 1 tablet by mouth 4 (four) times daily as needed. DIARRHEA , Disp: , Rfl:  .  Fluticasone-Salmeterol (ADVAIR DISKUS) 250-50 MCG/DOSE AEPB, Inhale 1 puff into the lungs 2 (two) times daily., Disp: 1 each, Rfl: 5 .  furosemide (LASIX) 20 MG tablet, Take 1 tablet (20 mg total) by mouth daily., Disp: 90 tablet, Rfl: 3 .  levofloxacin (LEVAQUIN) 750 MG tablet, Take 1 tablet (750 mg total) by mouth daily., Disp:  14 tablet, Rfl: 0 .  lisdexamfetamine (VYVANSE) 60 MG capsule, Take 1 capsule (60 mg total) by mouth every morning., Disp: 30 capsule, Rfl: 0 .  lisdexamfetamine (VYVANSE) 60 MG capsule, Take 1 capsule (60 mg total) by mouth every morning., Disp: 30 capsule, Rfl: 0 .  lisdexamfetamine (VYVANSE) 60 MG capsule, Take 1 capsule (60 mg total) by mouth every morning., Disp: 30 capsule, Rfl: 0 .  meclizine (ANTIVERT) 25 MG tablet, Take 1 tablet (25 mg total) by mouth 3 (three) times daily as needed for dizziness., Disp: 60 tablet, Rfl: 0 .  montelukast (SINGULAIR) 10 MG tablet, TAKE 1 TABLET ONCE DAILY IN THE EVENING., Disp: 30 tablet, Rfl: 10 .  olmesartan (BENICAR) 40 MG tablet, TAKE 1 TABLET DAILY IN THE MORNING., Disp: 30 tablet, Rfl: 5 .  phentermine (ADIPEX-P) 37.5 MG tablet, TAKE (1) TABLET DAILY IN THE MORNING., Disp: 30 tablet, Rfl: 0 .  SUMAtriptan (IMITREX) 100 MG tablet, TAKE 1 TABLET ONCE DAILY AS NEEDED FOR MIGRAINE AS DIRECTED BY PHYSICIAN., Disp: 30 tablet, Rfl: 11 .  valACYclovir (VALTREX) 1000 MG tablet, TAKE  (1)  TABLET  EVERY TWELVE HOURS., Disp: 20 tablet, Rfl: 0 .  VENTOLIN HFA 108 (90 Base) MCG/ACT inhaler, Inhale 2 puffs into the lungs every 6 (six) hours as needed for wheezing or shortness of breath., Disp: 1 Inhaler, Rfl: 11 Continue all other maintenance medications as listed above.  Follow up plan: Recheck prn  Educational handout given for [place] PA-C [facility] [address]  [place], [place] [address] [phone]   07/31/2017, 2:04 PM

## 2017-09-01 ENCOUNTER — Encounter: Payer: Self-pay | Admitting: Nurse Practitioner

## 2017-09-10 ENCOUNTER — Encounter: Payer: Self-pay | Admitting: Nurse Practitioner

## 2017-09-10 ENCOUNTER — Ambulatory Visit: Payer: 59 | Admitting: Nurse Practitioner

## 2017-09-10 VITALS — BP 130/80 | HR 78 | Ht 64.0 in | Wt 203.0 lb

## 2017-09-10 DIAGNOSIS — R1032 Left lower quadrant pain: Secondary | ICD-10-CM

## 2017-09-10 DIAGNOSIS — K582 Mixed irritable bowel syndrome: Secondary | ICD-10-CM | POA: Diagnosis not present

## 2017-09-10 MED ORDER — CIPROFLOXACIN HCL 500 MG PO TABS: 500.0000 mg | ORAL_TABLET | Freq: Two times a day (BID) | ORAL | 0 refills | Status: DC

## 2017-09-10 MED ORDER — METRONIDAZOLE 500 MG PO TABS: 500.0000 mg | ORAL_TABLET | Freq: Three times a day (TID) | ORAL | 0 refills | Status: DC

## 2017-09-10 NOTE — Progress Notes (Addendum)
Chief Complaint: LLQ pain  Referring Provider:     Particia Nearing, P.A-C   ASSESSMENT AND PLAN;   1. 57 yo Deanna [name] with LLQ pain. Recent course of antibiotics for presumed diverticulitis with near resolution of symptoms. Now with recurrent LLQ pain after eating spicy cashews. She gives a hx of IBS with alternating bowel and BMSs have been more erratic over last few weeks. She is tender in LLQ today. Presumably this is diverticulitis, IBS flare not excluded.  -will repeat course of abx.  She was previously treated with Levaquin. Will treat with cipro and flagyl  x 10 days.  -ROV in 2 weeks, or sooner if getting worse.  -If no improvement consider CT scan.  2. Hx of adenomatous colon polyps 2010, she didn't return for surveillance colonoscopy. Will schedule for colonoscopy when I see her in follow up (if sx have resolved). Need to wait ~ 6 weeks after presumed episode of diverticulitis  Addendum: Reviewed and agree with initial management. Pyrtle, Lajuan Lines, MD    HPI:     Deanna Deanna [name] known previously to Dr. Deatra Ina. She has a hx of diverticulitis and colon polyps. Last colonoscopy with polypectomy was Nov 2010. Polyp path c/w tubular adenoma without HGD. Deanna received our colonoscopy recall letter but put off having the colonoscopy because of some things going on in her life.   Deanna Deanna [name] is referred by PCP for LLQ pain. Several weeks ago she saw PCP with diverticulitis type sx. She also had bronchitis so given Levaquin to treat both. She got 75% better with antibiotics but developed recurrent LLQ pain a week later after eating spicy cashews.  Pain radiating into lower back and even down into LLE. She gives a hx of IBS with alternating constipation and diarrhea and her bowels have been much more erratic lately. Temp not checked but feels febrile at times. The LLQ pain is worse after eating. She has no urinary sx.     Past Medical History:  Diagnosis Date  . Allergy   .  Anxiety   . Asthma   . Cancer ([place])    breast  . Depression   . Diverticulosis of sigmoid colon 07/14/2011  . GERD (gastroesophageal reflux disease)   . Hypertension   . Migraine   . PONV (postoperative nausea and vomiting)      Past Surgical History:  Procedure Laterality Date  . ABDOMINAL HYSTERECTOMY  02/2010   partial  . BREAST REDUCTION SURGERY  02/2011  . BREAST SURGERY     cancer  . CHOLECYSTECTOMY  07/17/11   w/IOC  . NASAL SINUS SURGERY  02/2011  . TONSILLECTOMY  age 38   Family History  Problem Relation Age of Onset  . Mental illness Mother   . Stroke Mother   . Heart disease Mother   . Hyperlipidemia Mother   . Alcohol abuse Father   . Cancer Paternal Grandmother        colon  . Alcohol abuse Brother   . Asthma Brother   . Hypertension Brother    Social History   Tobacco Use  . Smoking status: Former Smoker    Packs/day: 0.25    Years: 2.00    Pack years: 0.50    Last attempt to quit: 07/28/2000    Years since quitting: 17.1  . Smokeless tobacco: Never Used  Substance Use Topics  . Alcohol use: No  . Drug use: No   Current Outpatient Medications  Medication Sig Dispense Refill  . ALPRAZolam (XANAX) 1 MG tablet TAKE (1) TABLET DAILY AT BEDTIME. 30 tablet 5  . buPROPion (WELLBUTRIN XL) 150 MG 24 hr tablet Take 3 tablets (450 mg total) by mouth daily. 90 tablet 11  . diphenoxylate-atropine (LOMOTIL) 2.5-0.025 MG per tablet Take 1 tablet by mouth 4 (four) times daily as needed. DIARRHEA     . Fluticasone-Salmeterol (ADVAIR DISKUS) 250-50 MCG/DOSE AEPB Inhale 1 puff into the lungs 2 (two) times daily. 1 each 5  . furosemide (LASIX) 20 MG tablet Take 1 tablet (20 mg total) by mouth daily. 90 tablet 3  . lisdexamfetamine (VYVANSE) 60 MG capsule Take 1 capsule (60 mg total) by mouth every morning. 30 capsule 0  . lisdexamfetamine (VYVANSE) 60 MG capsule Take 1 capsule (60 mg total) by mouth every morning. 30 capsule 0  . lisdexamfetamine (VYVANSE) 60 MG  capsule Take 1 capsule (60 mg total) by mouth every morning. 30 capsule 0  . meclizine (ANTIVERT) 25 MG tablet Take 1 tablet (25 mg total) by mouth 3 (three) times daily as needed for dizziness. 60 tablet 0  . montelukast (SINGULAIR) 10 MG tablet TAKE 1 TABLET ONCE DAILY IN THE EVENING. 30 tablet 10  . olmesartan (BENICAR) 40 MG tablet Take 40 mg by mouth daily.    . SUMAtriptan (IMITREX) 100 MG tablet TAKE 1 TABLET ONCE DAILY AS NEEDED FOR MIGRAINE AS DIRECTED BY PHYSICIAN. 30 tablet 11  . VENTOLIN HFA 108 (90 Base) MCG/ACT inhaler Inhale 2 puffs into the lungs every 6 (six) hours as needed for wheezing or shortness of breath. 1 Inhaler 11  . chlorpheniramine-HYDROcodone (TUSSIONEX) 10-8 MG/5ML SUER Take 5 mLs by mouth every 12 (twelve) hours as needed for cough. (Deanna not taking: Reported on 09/10/2017) 120 mL 0   No current facility-administered medications for this visit.    Allergies  Allergen Reactions  . Iohexol Anaphylaxis    IBD dye     Review of Systems: All systems reviewed and negative except where noted in HPI.    Physical Exam:    BP 130/80   Pulse 78   Ht 5\' 4"  (1.626 m)   Wt 203 lb (92.1 kg)   BMI 34.84 kg/m  Constitutional:  Well-developed, white Deanna [name] in no acute distress. Psychiatric: Normal mood and affect. Behavior is normal. EENT: Pupils normal.  Conjunctivae are normal. No scleral icterus. Neck supple.  Cardiovascular: Normal rate, regular rhythm. No edema Pulmonary/chest: Effort normal and breath sounds normal. No wheezing, rales or rhonchi. Abdominal: Soft, nondistended. Moderate LLQ tenderness. . Bowel sounds active throughout. There are no masses palpable. No hepatomegaly. Lymphadenopathy: No cervical adenopathy noted. Neurological: Alert and oriented to person place and time. Skin: Skin is warm and dry. No rashes noted.  Tye Savoy, NP  09/10/2017, 8:48 AM  Cc: Terald Sleeper, PA-C

## 2017-09-10 NOTE — Patient Instructions (Addendum)
If you are age 56 or older, your body mass index should be between 23-30. Your Body mass index is 34.84 kg/m. If this is out of the aforementioned range listed, please consider follow up with your Primary Care Provider.  If you are age 4 or younger, your body mass index should be between 19-25. Your Body mass index is 34.84 kg/m. If this is out of the aformentioned range listed, please consider follow up with your Primary Care Provider.   We have sent the following medications to your pharmacy for you to pick up at your convenience: Flagyl Cipro  Clear Liquids. Advance to low fiber, then when symptoms totally resolved, you need high fiber.  Follow up with Tye Savoy, NP on September 28, 2017 at 9:00 am.  Thank you for choosing me and [facility].   Tye Savoy, NP

## 2017-09-12 ENCOUNTER — Encounter: Payer: Self-pay | Admitting: Nurse Practitioner

## 2017-09-28 ENCOUNTER — Ambulatory Visit: Payer: 59 | Admitting: Nurse Practitioner

## 2017-09-28 ENCOUNTER — Encounter: Payer: Self-pay | Admitting: Nurse Practitioner

## 2017-09-28 VITALS — BP 138/80 | HR 82 | Ht 64.0 in | Wt 197.0 lb

## 2017-09-28 DIAGNOSIS — K5900 Constipation, unspecified: Secondary | ICD-10-CM | POA: Diagnosis not present

## 2017-09-28 DIAGNOSIS — B370 Candidal stomatitis: Secondary | ICD-10-CM | POA: Diagnosis not present

## 2017-09-28 DIAGNOSIS — Z860100 Personal history of colon polyps, unspecified: Secondary | ICD-10-CM

## 2017-09-28 MED ORDER — NA SULFATE-K SULFATE-MG SULF 17.5-3.13-1.6 GM/177ML PO SOLN: ORAL | 0 refills | Status: DC

## 2017-09-28 MED ORDER — CLOTRIMAZOLE 10 MG MT TROC: 10.0000 mg | Freq: Every day | OROMUCOSAL | 0 refills | Status: DC

## 2017-09-28 NOTE — Progress Notes (Addendum)
      IMPRESSION and PLAN:    1. LLQ pain, resolved after being treated empirically for diverticulitis  2. Post-prandial upper abdominal burning.  -trial of pepcid ac 20 mg before dinner  3. ? Oral candida. She does have a thin white coating on tongue.  -trial of mycelex troches x 14 days. If no improvement she will call back and will try diflucan  4. Hx of adenomatous colon polyps.  She is way overdue for surveillance  colonoscopy, last one was 2010.  -Patient will be scheduled for a surveillance colonoscopy with possible polypectomy.  The risks and benefits of the procedure were discussed and the patient agrees to proceed.  We will schedule the procedure to be done sometime in the next 4-6 weeks as patient was just empirically treated for diverticulitis  5. Constipation. No BM in 4 days on bland diet with increased starchy food intake.  -resume high fiber diet now -miralax has worked for her in the past. Resume at 1-2 times daily as needed.   6. ? Internal hemorrhoids vrs rectal prolapse. She describes an anal "protrusion" when straining. Sometimes takes hours to resolve itself.  -rectal exam deferred until time of colonoscopy  Addendum: Reviewed and agree with initial management. Pyrtle, Lajuan Lines, MD    HPI:    Chief Complaint: Follow-up on abdominal pain   Deanna [name] is back for follow up after being treated empirically for diverticulitis.  The left lower quadrant pain has resolved.  She still notices some soreness when straining to have a bowel movement.  No fevers.  She completed the Cipro and Flagyl.  Braya has not had a bowel movement in 4 days, probably because she has been following a bland diet / starchy diet while being treated for diverticulitis.   Over last few days she will has developed some postprandial upper abdominal burning.  Popsicles alleviate the burning.  She does not take NSAIDs.  No nausea or vomiting.  She is also noticed a yellowish white coating on her  tongue.  The coating is sour, she is unable to brush it off of her tongue.  No odynophagia or dysphagia.  No heartburn.     Past Medical History:  Diagnosis Date  . Allergy   . Anxiety   . Asthma   . Cancer ([place])    breast  . Depression   . Diverticulosis of sigmoid colon 07/14/2011  . GERD (gastroesophageal reflux disease)   . Hypertension   . Migraine   . PONV (postoperative nausea and vomiting)     Patient's surgical history, family medical history, social history, medications and allergies were all reviewed in Epic    Review of systems: No urinary symptoms.  No chest pain or shortness of breath   Physical Exam:     BP 138/80   Pulse 82   Ht 5\' 4"  (1.626 m)   Wt 197 lb (89.4 kg)   BMI 33.81 kg/m   GENERAL:  White female in NAD PSYCH: :Pleasant, cooperative, normal affect EENT:  conjunctiva pink, mucous membranes moist, neck supple without masses CARDIAC:  RRR, no peripheral edema PULM: Normal respiratory effort, lungs CTA bilaterally, no wheezing ABDOMEN:  Nondistended, soft, nontender. No obvious masses,  normal bowel sounds SKIN:  turgor, no lesions seen Musculoskeletal:  Normal muscle tone, normal strength NEURO: Alert and oriented x 3, no focal neurologic deficits  Tye Savoy , NP 09/28/2017, 8:57 AM

## 2017-09-28 NOTE — Addendum Note (Signed)
Addended by: Candie Mile on: 09/28/2017 04:47 PM   Modules accepted: Orders

## 2017-09-28 NOTE — Patient Instructions (Addendum)
If you are age 56 or older, your body mass index should be between 23-30. Your Body mass index is 33.81 kg/m. If this is out of the aforementioned range listed, please consider follow up with your Primary Care Provider.  If you are age 71 or younger, your body mass index should be between 19-25. Your Body mass index is 33.81 kg/m. If this is out of the aformentioned range listed, please consider follow up with your Primary Care Provider.   You have been scheduled for a colonoscopy. Please follow written instructions given to you at your visit today.  Please pick up your prep supplies at the pharmacy within the next 1-3 days. If you use inhalers (even only as needed), please bring them with you on the day of your procedure. Your physician has requested that you go to www.startemmi.com and enter the access code given to you at your visit today. This web site gives a general overview about your procedure. However, you should still follow specific instructions given to you by our office regarding your preparation for the procedure.  You may have a light breakfast the morning of prep day (the day before the procedure).   You may choose from one of the following items: eggs and toast OR chicken noodle soup and crackers.   You should have your breakfast completed between 8:00 and 9:00 am the day before your procedure.    After you have had your light breakfast you should start a clear liquid diet only, NO SOLIDS. No additional solid food is allowed. You may continue to have clear liquid up to 3 hours prior to your procedure.   We have sent the following medications to your pharmacy for you to pick up at your convenience:  Suprep Mycelex Troche 10 gm  Use Miralax 1-2 times daily as needed.  Use Pepcid AC 20 mg at bedtime.  Resume High Fiber Diet.  Thank you for choosing me and [facility].   Tye Savoy, NP

## 2017-10-12 ENCOUNTER — Other Ambulatory Visit: Payer: Self-pay | Admitting: Physician Assistant

## 2017-10-26 ENCOUNTER — Encounter: Payer: Self-pay | Admitting: Internal Medicine

## 2017-10-30 ENCOUNTER — Telehealth: Payer: Self-pay | Admitting: *Deleted

## 2017-10-30 NOTE — Telephone Encounter (Signed)
Noted per family doctor's note that pt is Phentermine.  LMOM to clarify with pt if she is or isn't taking that medication

## 2017-11-02 NOTE — Telephone Encounter (Signed)
Attempted to reach pt again- mailbox is full and cannot accept messages.

## 2017-11-03 ENCOUNTER — Encounter: Payer: Self-pay | Admitting: Internal Medicine

## 2017-11-03 ENCOUNTER — Other Ambulatory Visit: Payer: Self-pay

## 2017-11-03 ENCOUNTER — Ambulatory Visit (AMBULATORY_SURGERY_CENTER): Payer: 59 | Admitting: Internal Medicine

## 2017-11-03 VITALS — BP 139/81 | HR 72 | Temp 98.7°F | Resp 18 | Ht 64.0 in | Wt 197.0 lb

## 2017-11-03 DIAGNOSIS — Z86010 Personal history of colonic polyps: Secondary | ICD-10-CM | POA: Diagnosis present

## 2017-11-03 DIAGNOSIS — K635 Polyp of colon: Secondary | ICD-10-CM | POA: Diagnosis not present

## 2017-11-03 DIAGNOSIS — D125 Benign neoplasm of sigmoid colon: Secondary | ICD-10-CM

## 2017-11-03 DIAGNOSIS — D120 Benign neoplasm of cecum: Secondary | ICD-10-CM | POA: Diagnosis not present

## 2017-11-03 MED ORDER — SODIUM CHLORIDE 0.9 % IV SOLN: 500.0000 mL | Freq: Once | INTRAVENOUS | Status: DC

## 2017-11-03 NOTE — Progress Notes (Signed)
Called to room to assist during endoscopic procedure.  Patient ID and intended procedure confirmed with present staff. Received instructions for my participation in the procedure from the performing physician.  

## 2017-11-03 NOTE — Telephone Encounter (Signed)
Josh Monday is the CRNA for this pt today.  I made him aware of the possibility of her being on Phentermine- he is ok to proceed.

## 2017-11-03 NOTE — Progress Notes (Signed)
Report to PACU, RN, vss, BBS= Clear.  

## 2017-11-03 NOTE — Patient Instructions (Signed)
HANDOUTS GIVEN FOR POLYPS, DIVERTICULOSIS AND HEMORRHOIDS  YOU HAD AN ENDOSCOPIC PROCEDURE TODAY AT THE [facility]:   Refer to the procedure report that was given to you for any specific questions about what was found during the examination.  If the procedure report does not answer your questions, please call your gastroenterologist to clarify.  If you requested that your care partner not be given the details of your procedure findings, then the procedure report has been included in a sealed envelope for you to review at your convenience later.  YOU SHOULD EXPECT: Some feelings of bloating in the abdomen. Passage of more gas than usual.  Walking can help get rid of the air that was put into your GI tract during the procedure and reduce the bloating. If you had a lower endoscopy (such as a colonoscopy or flexible sigmoidoscopy) you may notice spotting of blood in your stool or on the toilet paper. If you underwent a bowel prep for your procedure, you may not have a normal bowel movement for a few days.  Please Note:  You might notice some irritation and congestion in your nose or some drainage.  This is from the oxygen used during your procedure.  There is no need for concern and it should clear up in a day or so.  SYMPTOMS TO REPORT IMMEDIATELY:   Following lower endoscopy (colonoscopy or flexible sigmoidoscopy):  Excessive amounts of blood in the stool  Significant tenderness or worsening of abdominal pains  Swelling of the abdomen that is new, acute  Fever of 100F or higher  For urgent or emergent issues, a gastroenterologist can be reached at any hour by calling [phone].   DIET:  We do recommend a small meal at first, but then you may proceed to your regular diet.  Drink plenty of fluids but you should avoid alcoholic beverages for 24 hours.  ACTIVITY:  You should plan to take it easy for the rest of today and you should NOT DRIVE or use heavy machinery until tomorrow  (because of the sedation medicines used during the test).    FOLLOW UP: Our staff will call the number listed on your records the next business day following your procedure to check on you and address any questions or concerns that you may have regarding the information given to you following your procedure. If we do not reach you, we will leave a message.  However, if you are feeling well and you are not experiencing any problems, there is no need to return our call.  We will assume that you have returned to your regular daily activities without incident.  If any biopsies were taken you will be contacted by phone or by letter within the next 1-3 weeks.  Please call us at [phone] if you have not heard about the biopsies in 3 weeks.    SIGNATURES/CONFIDENTIALITY: You and/or your care partner have signed paperwork which will be entered into your electronic medical record.  These signatures attest to the fact that that the information above on your After Visit Summary has been reviewed and is understood.  Full responsibility of the confidentiality of this discharge information lies with you and/or your care-partner. 

## 2017-11-03 NOTE — Op Note (Signed)
[facility] Patient Name: Deanna [name] Procedure Date: 11/03/2017 1:56 PM MRN: [id number] Endoscopist: Jerene Bears , MD Age: 57 Referring MD:  Date of Birth: 1961-05-12 Gender: Female Account #: [id number] Procedure:                Colonoscopy Indications:              High risk colon cancer surveillance: Personal                            history of colonic polyps, Last colonoscopy: 2010 Medicines:                Monitored Anesthesia Care Procedure:                Pre-Anesthesia Assessment:                           - Prior to the procedure, a History and Physical                            was performed, and patient medications and                            allergies were reviewed. The patient's tolerance of                            previous anesthesia was also reviewed. The risks                            and benefits of the procedure and the sedation                            options and risks were discussed with the patient.                            All questions were answered, and informed consent                            was obtained. Prior Anticoagulants: The patient has                            taken no previous anticoagulant or antiplatelet                            agents. ASA Grade Assessment: II - A patient with                            mild systemic disease. After reviewing the risks                            and benefits, the patient was deemed in                            satisfactory condition to undergo the procedure.  After obtaining informed consent, the colonoscope                            was passed under direct vision. Throughout the                            procedure, the patient's blood pressure, pulse, and                            oxygen saturations were monitored continuously. The                            Colonoscope was introduced through the anus and                            advanced to the the  cecum, identified by                            appendiceal orifice and ileocecal valve. The                            colonoscopy was performed without difficulty. The                            patient tolerated the procedure well. The quality                            of the bowel preparation was good. The ileocecal                            valve, appendiceal orifice, and rectum were                            photographed. Scope In: 1:59:18 PM Scope Out: 2:16:00 PM Scope Withdrawal Time: 0 hours 13 minutes 41 seconds  Total Procedure Duration: 0 hours 16 minutes 42 seconds  Findings:                 The digital rectal exam was normal.                           Two sessile polyps were found in the cecum. The                            polyps were 5 to 8 mm in size. These polyps were                            removed with a cold snare. Resection and retrieval                            were complete.                           A 5 mm polyp was found in the sigmoid colon. The  polyp was sessile. The polyp was removed with a                            cold snare. Resection and retrieval were complete.                           Multiple small and large-mouthed diverticula were                            found from ascending colon to sigmoid colon.                           Internal hemorrhoids were found during                            retroflexion. The hemorrhoids were small. Complications:            No immediate complications. Estimated Blood Loss:     Estimated blood loss was minimal. Impression:               - Two 5 to 8 mm polyps in the cecum, removed with a                            cold snare. Resected and retrieved.                           - One 5 mm polyp in the sigmoid colon, removed with                            a cold snare. Resected and retrieved.                           - Moderate diverticulosis from ascending colon to                             sigmoid colon.                           - Internal hemorrhoids. Recommendation:           - Patient has a contact number available for                            emergencies. The signs and symptoms of potential                            delayed complications were discussed with the                            patient. Return to normal activities tomorrow.                            Written discharge instructions were provided to the  patient.                           - Resume previous diet.                           - Continue present medications.                           - Await pathology results.                           - Repeat colonoscopy is recommended for                            surveillance. The colonoscopy date will be                            determined after pathology results from today's                            exam become available for review. Jerene Bears, MD 11/03/2017 2:18:35 PM This report has been signed electronically.

## 2017-11-04 ENCOUNTER — Telehealth: Payer: Self-pay

## 2017-11-04 ENCOUNTER — Telehealth: Payer: Self-pay | Admitting: *Deleted

## 2017-11-04 NOTE — Telephone Encounter (Signed)
Name identifier, left a message. 

## 2017-11-04 NOTE — Telephone Encounter (Signed)
  Follow up Call-  Call back number 11/03/2017  Post procedure Call Back phone  # [phone]  Permission to leave phone message Yes  Some recent data might be hidden     Patient questions:  Do you have a fever, pain , or abdominal swelling? No. Pain Score  0 *  Have you tolerated food without any problems? Yes.    Have you been able to return to your normal activities? Yes.    Do you have any questions about your discharge instructions: Diet   No. Medications  No. Follow up visit  No.  Do you have questions or concerns about your Care? No.  Actions: * If pain score is 4 or above: No action needed, pain <4.

## 2017-11-09 ENCOUNTER — Encounter: Payer: Self-pay | Admitting: Internal Medicine

## 2017-11-12 ENCOUNTER — Encounter: Payer: Self-pay | Admitting: Physician Assistant

## 2017-11-12 ENCOUNTER — Ambulatory Visit (INDEPENDENT_AMBULATORY_CARE_PROVIDER_SITE_OTHER): Payer: 59 | Admitting: Physician Assistant

## 2017-11-12 VITALS — BP 134/87 | HR 78 | Temp 98.4°F | Ht 64.0 in | Wt 204.4 lb

## 2017-11-12 DIAGNOSIS — J0111 Acute recurrent frontal sinusitis: Secondary | ICD-10-CM | POA: Diagnosis not present

## 2017-11-12 DIAGNOSIS — F900 Attention-deficit hyperactivity disorder, predominantly inattentive type: Secondary | ICD-10-CM | POA: Diagnosis not present

## 2017-11-12 DIAGNOSIS — R42 Dizziness and giddiness: Secondary | ICD-10-CM | POA: Diagnosis not present

## 2017-11-12 DIAGNOSIS — G43909 Migraine, unspecified, not intractable, without status migrainosus: Secondary | ICD-10-CM | POA: Insufficient documentation

## 2017-11-12 DIAGNOSIS — G43809 Other migraine, not intractable, without status migrainosus: Secondary | ICD-10-CM

## 2017-11-12 DIAGNOSIS — J329 Chronic sinusitis, unspecified: Secondary | ICD-10-CM | POA: Insufficient documentation

## 2017-11-12 DIAGNOSIS — J0101 Acute recurrent maxillary sinusitis: Secondary | ICD-10-CM

## 2017-11-12 MED ORDER — LISDEXAMFETAMINE DIMESYLATE 70 MG PO CAPS: 70.0000 mg | ORAL_CAPSULE | ORAL | 0 refills | Status: DC

## 2017-11-12 MED ORDER — METHYLPREDNISOLONE ACETATE 80 MG/ML IJ SUSP
80.0000 mg | Freq: Once | INTRAMUSCULAR | Status: AC
  Administered 2017-11-12: 80 mg via INTRAMUSCULAR

## 2017-11-12 MED ORDER — LEVOFLOXACIN 500 MG PO TABS: 500.0000 mg | ORAL_TABLET | Freq: Every day | ORAL | 0 refills | Status: DC

## 2017-11-17 ENCOUNTER — Other Ambulatory Visit: Payer: Self-pay | Admitting: Physician Assistant

## 2017-11-18 NOTE — Progress Notes (Signed)
BP 134/87   Pulse 78   Temp 98.4 F (36.9 C) (Oral)   Ht 5\' 4"  (1.626 m)   Wt 204 lb 6.4 oz (92.7 kg)   BMI 35.09 kg/m    Subjective:    Patient ID: GLAYDS INSCO, female    DOB: 12-Mar-1961, 57 y.o.   MRN: [id number]  HPI: Deanna [name] is a 57 y.o. female presenting on 11/12/2017 for Dizziness; Emesis; and Ear Pain (left )  This patient has had many days of sinus headache and postnasal drainage. There is copious drainage at times. Denies any fever at this time. There has been a history of sinus infections in the past.  No history of sinus surgery. There is cough at night. It has become more prevalent in recent days.  Also rechecking on ADHD and migraine. States she is stable overall, refills are needed.  Past Medical History:  Diagnosis Date  . Allergy   . Anxiety   . Asthma   . Cancer ([place])    breast  . Depression   . Diverticulosis of sigmoid colon 07/14/2011  . GERD (gastroesophageal reflux disease)   . Hypertension   . Migraine   . PONV (postoperative nausea and vomiting)    Relevant past medical, surgical, family and social history reviewed and updated as indicated. Interim medical history since our last visit reviewed. Allergies and medications reviewed and updated. DATA REVIEWED: CHART IN EPIC  Family History reviewed for pertinent findings.  Review of Systems  Constitutional: Positive for chills and fatigue. Negative for activity change and appetite change.  HENT: Positive for congestion, postnasal drip and sore throat.   Eyes: Negative.   Respiratory: Positive for cough and wheezing.   Cardiovascular: Negative.  Negative for chest pain, palpitations and leg swelling.  Gastrointestinal: Negative.   Genitourinary: Negative.   Musculoskeletal: Negative.   Skin: Negative.   Neurological: Positive for headaches.    Allergies as of 11/12/2017      Reactions   Iohexol Anaphylaxis   IBD dye      Medication List        Accurate as of 11/12/17 11:59  PM. Always use your most recent med list.          ALPRAZolam 1 MG tablet Commonly known as:  XANAX TAKE (1) TABLET DAILY AT BEDTIME.   buPROPion 150 MG 24 hr tablet Commonly known as:  WELLBUTRIN XL Take 3 tablets (450 mg total) by mouth daily.   Fluticasone-Salmeterol 250-50 MCG/DOSE Aepb Commonly known as:  ADVAIR DISKUS Inhale 1 puff into the lungs 2 (two) times daily.   furosemide 20 MG tablet Commonly known as:  LASIX Take 1 tablet (20 mg total) by mouth daily.   levofloxacin 500 MG tablet Commonly known as:  LEVAQUIN Take 1 tablet (500 mg total) by mouth daily.   lisdexamfetamine 70 MG capsule Commonly known as:  VYVANSE Take 1 capsule (70 mg total) by mouth every morning.   meclizine 25 MG tablet Commonly known as:  ANTIVERT Take 1 tablet (25 mg total) by mouth 3 (three) times daily as needed for dizziness.   montelukast 10 MG tablet Commonly known as:  SINGULAIR TAKE 1 TABLET ONCE DAILY IN THE EVENING.   olmesartan 40 MG tablet Commonly known as:  BENICAR Take 40 mg by mouth daily.   SUMAtriptan 100 MG tablet Commonly known as:  IMITREX TAKE 1 TABLET ONCE DAILY AS NEEDED FOR MIGRAINE AS DIRECTED BY PHYSICIAN.   VENTOLIN HFA 108 (90 Base)  MCG/ACT inhaler Generic drug:  albuterol Inhale 2 puffs into the lungs every 6 (six) hours as needed for wheezing or shortness of breath.          Objective:    BP 134/87   Pulse 78   Temp 98.4 F (36.9 C) (Oral)   Ht 5\' 4"  (1.626 m)   Wt 204 lb 6.4 oz (92.7 kg)   BMI 35.09 kg/m   Allergies  Allergen Reactions  . Iohexol Anaphylaxis    IBD dye    Wt Readings from Last 3 Encounters:  11/12/17 204 lb 6.4 oz (92.7 kg)  11/03/17 197 lb (89.4 kg)  09/28/17 197 lb (89.4 kg)    Physical Exam  Constitutional: She is oriented to person, place, and time. She appears well-developed and well-nourished.  HENT:  Head: Normocephalic and atraumatic.  Right Ear: Tympanic membrane and external ear normal. No middle  ear effusion.  Left Ear: Tympanic membrane and external ear normal.  No middle ear effusion.  Nose: Mucosal edema and rhinorrhea present. Right sinus exhibits no maxillary sinus tenderness. Left sinus exhibits no maxillary sinus tenderness.  Mouth/Throat: Uvula is midline. Posterior oropharyngeal erythema present.  Eyes: Pupils are equal, round, and reactive to light. Conjunctivae and EOM are normal. Right eye exhibits no discharge. Left eye exhibits no discharge.  Neck: Normal range of motion.  Cardiovascular: Normal rate, regular rhythm and normal heart sounds.  Pulmonary/Chest: Effort normal and breath sounds normal. No respiratory distress. She has no wheezes.  Abdominal: Soft.  Lymphadenopathy:    She has no cervical adenopathy.  Neurological: She is alert and oriented to person, place, and time.  Skin: Skin is warm and dry.  Psychiatric: She has a normal mood and affect.        Assessment & Plan:   1. Vertigo  2. Other migraine without status migrainosus, not intractable  3. Acute recurrent maxillary sinusitis - levofloxacin (LEVAQUIN) 500 MG tablet; Take 1 tablet (500 mg total) by mouth daily.  Dispense: 10 tablet; Refill: 0 - methylPREDNISolone acetate (DEPO-MEDROL) injection 80 mg  4. Acute recurrent frontal sinusitis  5. Attention deficit hyperactivity disorder (ADHD), predominantly inattentive type - lisdexamfetamine (VYVANSE) 70 MG capsule; Take 1 capsule (70 mg total) by mouth every morning.  Dispense: 30 capsule; Refill: 0   Continue all other maintenance medications as listed above.  Follow up plan: Return in about 1 month (around 12/10/2017) for recheck.  Educational handout given for [place] PA-C [facility] [address]  [place], [place] [address] [phone]   11/18/2017, 10:06 PM

## 2017-12-04 ENCOUNTER — Other Ambulatory Visit: Payer: Self-pay | Admitting: Physician Assistant

## 2017-12-15 ENCOUNTER — Ambulatory Visit: Payer: 59 | Admitting: Physician Assistant

## 2017-12-22 LAB — HM MAMMOGRAPHY

## 2018-01-04 ENCOUNTER — Other Ambulatory Visit: Payer: Self-pay | Admitting: Physician Assistant

## 2018-01-04 DIAGNOSIS — J0101 Acute recurrent maxillary sinusitis: Secondary | ICD-10-CM

## 2018-01-05 ENCOUNTER — Other Ambulatory Visit: Payer: Self-pay | Admitting: Physician Assistant

## 2018-01-05 DIAGNOSIS — J0101 Acute recurrent maxillary sinusitis: Secondary | ICD-10-CM

## 2018-01-07 ENCOUNTER — Telehealth: Payer: Self-pay | Admitting: Physician Assistant

## 2018-01-07 ENCOUNTER — Other Ambulatory Visit: Payer: Self-pay | Admitting: Physician Assistant

## 2018-01-07 MED ORDER — SUMATRIPTAN SUCCINATE 100 MG PO TABS: ORAL_TABLET | ORAL | 11 refills | Status: DC

## 2018-01-07 NOTE — Telephone Encounter (Signed)
Left message - rx requested has been sent to pharmacy.

## 2018-01-07 NOTE — Telephone Encounter (Signed)
Covering for PCP, refill imitrex.   Laroy Apple, MD [facility] 01/07/2018, 11:31 AM

## 2018-01-07 NOTE — Telephone Encounter (Signed)
Pt is waiting for this before she leaves for the beach! She is leaving as soon as she gets an answer. Please respond.

## 2018-01-11 ENCOUNTER — Other Ambulatory Visit: Payer: Self-pay | Admitting: Physician Assistant

## 2018-01-19 ENCOUNTER — Ambulatory Visit: Payer: 59 | Admitting: Physician Assistant

## 2018-01-19 ENCOUNTER — Encounter: Payer: Self-pay | Admitting: Physician Assistant

## 2018-01-19 VITALS — BP 147/88 | HR 71 | Temp 98.5°F | Ht 64.0 in | Wt 208.4 lb

## 2018-01-19 DIAGNOSIS — J40 Bronchitis, not specified as acute or chronic: Secondary | ICD-10-CM

## 2018-01-19 DIAGNOSIS — F900 Attention-deficit hyperactivity disorder, predominantly inattentive type: Secondary | ICD-10-CM | POA: Diagnosis not present

## 2018-01-19 MED ORDER — FLUCONAZOLE 150 MG PO TABS: ORAL_TABLET | ORAL | 0 refills | Status: DC

## 2018-01-19 MED ORDER — LISDEXAMFETAMINE DIMESYLATE 70 MG PO CAPS: 70.0000 mg | ORAL_CAPSULE | ORAL | 0 refills | Status: DC

## 2018-01-19 MED ORDER — METHYLPREDNISOLONE ACETATE 80 MG/ML IJ SUSP
80.0000 mg | Freq: Once | INTRAMUSCULAR | Status: AC
  Administered 2018-01-19: 80 mg via INTRAMUSCULAR

## 2018-01-19 MED ORDER — AMOXICILLIN-POT CLAVULANATE 875-125 MG PO TABS: 1.0000 | ORAL_TABLET | Freq: Two times a day (BID) | ORAL | 0 refills | Status: DC

## 2018-01-20 DIAGNOSIS — F900 Attention-deficit hyperactivity disorder, predominantly inattentive type: Secondary | ICD-10-CM | POA: Insufficient documentation

## 2018-01-20 NOTE — Progress Notes (Signed)
BP (!) 147/88   Pulse 71   Temp 98.5 F (36.9 C) (Oral)   Ht 5\' 4"  (1.626 m)   Wt 208 lb 6.4 oz (94.5 kg)   BMI 35.77 kg/m    Subjective:    Patient ID: Deanna [name], female    DOB: 03-10-1961, 57 y.o.   MRN: [id number]  HPI: Deanna [name] is a 57 y.o. female presenting on 01/19/2018 for Cough and Hoarse  Patient with several days of progressing upper respiratory and bronchial symptoms. Initially there was more upper respiratory congestion. This progressed to having significant cough that is productive throughout the day and severe at night. There is occasional wheezing after coughing. Sometimes there is slight dyspnea on exertion. It is productive mucus that is yellow in color. Denies any blood.  Similar to the infection she often gets.  The patient is also rechecking her ADHD.  She is tolerating Vyvanse fairly well not having any difficulty we are doing a refill and will recheck her in about 1 month.  Past Medical History:  Diagnosis Date  . Allergy   . Anxiety   . Asthma   . Cancer ([place])    breast  . Depression   . Diverticulosis of sigmoid colon 07/14/2011  . GERD (gastroesophageal reflux disease)   . Hypertension   . Migraine   . PONV (postoperative nausea and vomiting)    Relevant past medical, surgical, family and social history reviewed and updated as indicated. Interim medical history since our last visit reviewed. Allergies and medications reviewed and updated. DATA REVIEWED: CHART IN EPIC  Family History reviewed for pertinent findings.  Review of Systems  Constitutional: Positive for chills and fatigue. Negative for activity change and appetite change.  HENT: Positive for congestion, postnasal drip and sore throat.   Eyes: Negative.   Respiratory: Positive for cough and wheezing.   Cardiovascular: Negative.  Negative for chest pain, palpitations and leg swelling.  Gastrointestinal: Negative.   Genitourinary: Negative.   Musculoskeletal: Negative.     Skin: Negative.   Neurological: Positive for headaches.    Allergies as of 01/19/2018      Reactions   Iohexol Anaphylaxis   IBD dye      Medication List        Accurate as of 01/19/18 11:59 PM. Always use your most recent med list.          ALPRAZolam 1 MG tablet Commonly known as:  XANAX TAKE (1) TABLET DAILY AT BEDTIME.   amoxicillin-clavulanate 875-125 MG tablet Commonly known as:  AUGMENTIN Take 1 tablet by mouth 2 (two) times daily.   buPROPion 150 MG 24 hr tablet Commonly known as:  WELLBUTRIN XL Take 3 tablets (450 mg total) by mouth daily.   fluconazole 150 MG tablet Commonly known as:  DIFLUCAN 1 po q week x 4 weeks   Fluticasone-Salmeterol 250-50 MCG/DOSE Aepb Commonly known as:  ADVAIR DISKUS Inhale 1 puff into the lungs 2 (two) times daily.   furosemide 20 MG tablet Commonly known as:  LASIX TAKE 1 TABLET ONCE DAILY.   lisdexamfetamine 70 MG capsule Commonly known as:  VYVANSE Take 1 capsule (70 mg total) by mouth every morning.   meclizine 25 MG tablet Commonly known as:  ANTIVERT Take 1 tablet (25 mg total) by mouth 3 (three) times daily as needed for dizziness.   montelukast 10 MG tablet Commonly known as:  SINGULAIR TAKE 1 TABLET ONCE DAILY IN THE EVENING.   olmesartan 40 MG tablet  Commonly known as:  BENICAR Take 40 mg by mouth daily.   phentermine 37.5 MG tablet Commonly known as:  ADIPEX-P TAKE (1) TABLET DAILY IN THE MORNING.   SUMAtriptan 100 MG tablet Commonly known as:  IMITREX TAKE 1 TABLET ONCE DAILY AS NEEDED FOR MIGRAINE AS DIRECTED BY PHYSICIAN.   VENTOLIN HFA 108 (90 Base) MCG/ACT inhaler Generic drug:  albuterol Inhale 2 puffs into the lungs every 6 (six) hours as needed for wheezing or shortness of breath.          Objective:    BP (!) 147/88   Pulse 71   Temp 98.5 F (36.9 C) (Oral)   Ht 5\' 4"  (1.626 m)   Wt 208 lb 6.4 oz (94.5 kg)   BMI 35.77 kg/m   Allergies  Allergen Reactions  . Iohexol  Anaphylaxis    IBD dye    Wt Readings from Last 3 Encounters:  01/19/18 208 lb 6.4 oz (94.5 kg)  11/12/17 204 lb 6.4 oz (92.7 kg)  11/03/17 197 lb (89.4 kg)    Physical Exam  Constitutional: She is oriented to person, place, and time. She appears well-developed and well-nourished.  HENT:  Head: Normocephalic and atraumatic.  Right Ear: There is drainage and tenderness.  Left Ear: There is drainage and tenderness.  Nose: Mucosal edema and rhinorrhea present. Right sinus exhibits no maxillary sinus tenderness and no frontal sinus tenderness. Left sinus exhibits no maxillary sinus tenderness and no frontal sinus tenderness.  Mouth/Throat: Oropharyngeal exudate and posterior oropharyngeal erythema present.  Eyes: Pupils are equal, round, and reactive to light. Conjunctivae and EOM are normal.  Neck: Normal range of motion. Neck supple.  Cardiovascular: Normal rate, regular rhythm, normal heart sounds and intact distal pulses.  Pulmonary/Chest: Effort normal. She has wheezes in the right upper field and the left upper field.  Abdominal: Soft. Bowel sounds are normal.  Neurological: She is alert and oriented to person, place, and time. She has normal reflexes.  Skin: Skin is warm and dry. No rash noted.  Psychiatric: She has a normal mood and affect. Her behavior is normal. Judgment and thought content normal.        Assessment & Plan:   1. Bronchitis - amoxicillin-clavulanate (AUGMENTIN) 875-125 MG tablet; Take 1 tablet by mouth 2 (two) times daily.  Dispense: 20 tablet; Refill: 0 - methylPREDNISolone acetate (DEPO-MEDROL) injection 80 mg - fluconazole (DIFLUCAN) 150 MG tablet; 1 po q week x 4 weeks  Dispense: 4 tablet; Refill: 0  2. Attention deficit hyperactivity disorder (ADHD), predominantly inattentive type - lisdexamfetamine (VYVANSE) 70 MG capsule; Take 1 capsule (70 mg total) by mouth every morning.  Dispense: 30 capsule; Refill: 0   Continue all other maintenance  medications as listed above.  Follow up plan: Return in about 1 month (around 02/16/2018) for recheck.  Educational handout given for [place] PA-C [facility] [address]  [place], [place] [address] [phone]   01/20/2018, 8:13 AM

## 2018-01-21 ENCOUNTER — Telehealth: Payer: Self-pay | Admitting: Physician Assistant

## 2018-01-21 NOTE — Telephone Encounter (Signed)
Okay to do note?

## 2018-01-21 NOTE — Telephone Encounter (Signed)
Patient would like her work note extended to Monday. Patient has made apt to see Ronnald Ramp 6/28- please call and let her know if she needs to come.

## 2018-01-22 ENCOUNTER — Ambulatory Visit: Payer: 59 | Admitting: Physician Assistant

## 2018-01-22 DIAGNOSIS — Z0289 Encounter for other administrative examinations: Secondary | ICD-10-CM

## 2018-01-22 NOTE — Telephone Encounter (Signed)
Patient aware note is ready to be picked up.

## 2018-01-25 ENCOUNTER — Ambulatory Visit: Payer: 59 | Admitting: Physician Assistant

## 2018-01-27 ENCOUNTER — Other Ambulatory Visit: Payer: Self-pay | Admitting: Physician Assistant

## 2018-02-18 ENCOUNTER — Encounter: Payer: Self-pay | Admitting: Physician Assistant

## 2018-02-18 ENCOUNTER — Ambulatory Visit (INDEPENDENT_AMBULATORY_CARE_PROVIDER_SITE_OTHER): Payer: 59 | Admitting: Physician Assistant

## 2018-02-18 VITALS — BP 130/79 | HR 81 | Ht 64.0 in | Wt 205.0 lb

## 2018-02-18 DIAGNOSIS — F320 Major depressive disorder, single episode, mild: Secondary | ICD-10-CM | POA: Diagnosis not present

## 2018-02-18 DIAGNOSIS — F411 Generalized anxiety disorder: Secondary | ICD-10-CM

## 2018-02-18 MED ORDER — ESCITALOPRAM OXALATE 10 MG PO TABS: 10.0000 mg | ORAL_TABLET | Freq: Every day | ORAL | 1 refills | Status: DC

## 2018-02-18 NOTE — Patient Instructions (Signed)
Your provider wants you to schedule an appointment with a Psychologist/Psychiatrist. The following list of offices requires the patient to call and make their own appointment, as there is information they need that only you can provide. Please feel free to choose form the following providers:  [facility]   [phone] Crisis Recovery in [place] [phone]  [facility]  [phone]   [address] [place], [place]  (Scheduled through [facility]) Must call and do an interview for appointment. Sees Children / Accepts Medicaid  [facility]    [phone]  [address], [address]    [place], [place]       [facility]  [phone] [address] [place], [place]  Evaluates for Autism but does not treat it Sees Children / Accepts Medicaid  [facility]    [phone] [address], [address]   [place], [place] Medication management, substance abuse, bipolar, grief, family, marriage, OCD, anxiety, PTSD Sees children / Accepts Medicaid  [facility]    [phone] [address] [place], [place] Sees children / Accepts Medicaid  [facility]  [phone] [address] [place], [place]   Dr Akinlayo     [phone] [address] [place], [place]  Sees ADD & ADHD for treatment Accepts Medicaid  [facility]  [phone] [address] [place], [place] Evaluates for Autism Accepts Medicaid  [facility]  [phone] [address]  [place], [place]  Does Adult ADD evaluations Does not accept Medicaid  [facility]   [phone] [address]   [place], [place] Uses animal therapy  Sees children as young as 3 years old Accepts Medicaid  [facility]     [phone]    [address]  [place],  [address] Sees children Accepts Medicaid  

## 2018-02-22 NOTE — Progress Notes (Signed)
BP 130/79   Pulse 81   Ht 5\' 4"  (1.626 m)   Wt 205 lb (93 kg)   BMI 35.19 kg/m    Subjective:    Patient ID: Deanna [name], female    DOB: 1961-04-23, 57 y.o.   MRN: [id number]  HPI: Deanna [name] is a 57 y.o. female presenting on 02/18/2018 for ADHD (1 month follow up. Patient stopped vyvanse) This patient comes in for 1 month recheck on her ADHD medication.  After just a week or so she was feeling extremely nervous on the Vyvanse.  Today for she has stopped it.  She is quite distraught and upset today because about 2 weeks ago her daughter miscarried.  This is the second time it has happened.  She is visibly upset and very concerned about the physical and emotional health of her daughter.  At this time we are not getting her to initiate any type of ADHD medications to avoid the nervousness.  Work and address her depression and anxiety more.. Depression screen [facility] 2/9 02/18/2018 01/19/2018 11/12/2017 07/31/2017 12/30/2016  Decreased Interest 0 0 0 0 0  Down, Depressed, Hopeless 0 0 0 0 0  PHQ - 2 Score 0 0 0 0 0     Past Medical History:  Diagnosis Date  . Allergy   . Anxiety   . Asthma   . Cancer ([place])    breast  . Depression   . Diverticulosis of sigmoid colon 07/14/2011  . GERD (gastroesophageal reflux disease)   . Hypertension   . Migraine   . PONV (postoperative nausea and vomiting)    Relevant past medical, surgical, family and social history reviewed and updated as indicated. Interim medical history since our last visit reviewed. Allergies and medications reviewed and updated. DATA REVIEWED: CHART IN EPIC  Family History reviewed for pertinent findings.  Review of Systems  Constitutional: Negative.   HENT: Negative.   Eyes: Negative.   Respiratory: Negative.   Gastrointestinal: Negative.   Genitourinary: Negative.     Allergies as of 02/18/2018      Reactions   Iohexol Anaphylaxis   IBD dye      Medication List        Accurate as of 02/18/18 11:59 PM.  Always use your most recent med list.          ALPRAZolam 1 MG tablet Commonly known as:  XANAX TAKE (1) TABLET DAILY AT BEDTIME.   buPROPion 150 MG 24 hr tablet Commonly known as:  WELLBUTRIN XL Take 3 tablets (450 mg total) by mouth daily.   escitalopram 10 MG tablet Commonly known as:  LEXAPRO Take 1 tablet (10 mg total) by mouth daily.   Fluticasone-Salmeterol 250-50 MCG/DOSE Aepb Commonly known as:  ADVAIR DISKUS Inhale 1 puff into the lungs 2 (two) times daily.   furosemide 20 MG tablet Commonly known as:  LASIX TAKE 1 TABLET ONCE DAILY.   meclizine 25 MG tablet Commonly known as:  ANTIVERT Take 1 tablet (25 mg total) by mouth 3 (three) times daily as needed for dizziness.   montelukast 10 MG tablet Commonly known as:  SINGULAIR TAKE 1 TABLET ONCE DAILY IN THE EVENING.   olmesartan 40 MG tablet Commonly known as:  BENICAR Take 40 mg by mouth daily.   SUMAtriptan 100 MG tablet Commonly known as:  IMITREX TAKE 1 TABLET ONCE DAILY AS NEEDED FOR MIGRAINE AS DIRECTED BY PHYSICIAN.   valACYclovir 1000 MG tablet Commonly known as:  VALTREX TAKE  (  1)  TABLET  EVERY TWELVE HOURS.   VENTOLIN HFA 108 (90 Base) MCG/ACT inhaler Generic drug:  albuterol Inhale 2 puffs into the lungs every 6 (six) hours as needed for wheezing or shortness of breath.          Objective:    BP 130/79   Pulse 81   Ht 5\' 4"  (1.626 m)   Wt 205 lb (93 kg)   BMI 35.19 kg/m   Allergies  Allergen Reactions  . Iohexol Anaphylaxis    IBD dye    Wt Readings from Last 3 Encounters:  02/18/18 205 lb (93 kg)  01/19/18 208 lb 6.4 oz (94.5 kg)  11/12/17 204 lb 6.4 oz (92.7 kg)    Physical Exam  Constitutional: She is oriented to person, place, and time. She appears well-developed and well-nourished.  HENT:  Head: Normocephalic and atraumatic.  Eyes: Pupils are equal, round, and reactive to light. Conjunctivae and EOM are normal.  Cardiovascular: Normal rate, regular rhythm, normal  heart sounds and intact distal pulses.  Pulmonary/Chest: Effort normal and breath sounds normal.  Abdominal: Soft. Bowel sounds are normal.  Neurological: She is alert and oriented to person, place, and time. She has normal reflexes.  Skin: Skin is warm and dry. No rash noted.  Psychiatric: She has a normal mood and affect. Her behavior is normal. Judgment and thought content normal.        Assessment & Plan:   1. Depression, major, single episode, mild (HCC) - escitalopram (LEXAPRO) 10 MG tablet; Take 1 tablet (10 mg total) by mouth daily.  Dispense: 30 tablet; Refill: 1  2. GAD (generalized anxiety disorder) Continue medications   Continue all other maintenance medications as listed above.  Follow up plan: Return in about 1 month (around 03/18/2018).  Educational handout given for Daniel PA-C [place] [address]  [place], [place] [address] [phone]   02/22/2018, 11:48 AM

## 2018-03-01 ENCOUNTER — Other Ambulatory Visit: Payer: Self-pay | Admitting: Physician Assistant

## 2018-03-18 ENCOUNTER — Encounter: Payer: Self-pay | Admitting: Physician Assistant

## 2018-03-18 ENCOUNTER — Ambulatory Visit: Payer: 59 | Admitting: Physician Assistant

## 2018-03-18 VITALS — BP 134/88 | HR 91 | Temp 97.7°F | Ht 64.0 in | Wt 213.0 lb

## 2018-03-18 DIAGNOSIS — F411 Generalized anxiety disorder: Secondary | ICD-10-CM | POA: Diagnosis not present

## 2018-03-18 DIAGNOSIS — J0101 Acute recurrent maxillary sinusitis: Secondary | ICD-10-CM

## 2018-03-18 DIAGNOSIS — F320 Major depressive disorder, single episode, mild: Secondary | ICD-10-CM

## 2018-03-18 MED ORDER — FLUOXETINE HCL 10 MG PO CAPS: 10.0000 mg | ORAL_CAPSULE | Freq: Every day | ORAL | 3 refills | Status: DC

## 2018-03-18 MED ORDER — DOXYCYCLINE HYCLATE 100 MG PO TABS: 100.0000 mg | ORAL_TABLET | Freq: Two times a day (BID) | ORAL | 0 refills | Status: DC

## 2018-03-18 MED ORDER — ALPRAZOLAM 1 MG PO TABS: 1.0000 mg | ORAL_TABLET | Freq: Two times a day (BID) | ORAL | 2 refills | Status: DC | PRN

## 2018-03-18 MED ORDER — PREDNISONE 10 MG (21) PO TBPK: ORAL_TABLET | ORAL | 0 refills | Status: DC

## 2018-03-22 NOTE — Progress Notes (Signed)
BP 134/88   Pulse 91   Temp 97.7 F (36.5 C) (Oral)   Ht 5\' 4"  (1.626 m)   Wt 213 lb (96.6 kg)   BMI 36.56 kg/m    Subjective:    Patient ID: Deanna [name], female    DOB: [birth year], 57 y.o.   MRN: [id number]  HPI: Deanna [name] is a 57 y.o. female presenting on 03/18/2018 for Depression  This patient comes in for recheck on her depression.  Is been about 1 month she started her medication.  She reports that she did not tolerate it very well and therefore stopped it within a week or so.  We have discussed that we will send some the notes.  And also if there is any problem to please call back in short order so that we can not have a gap in her treatment.  She is still working through a significant amount of issues at home and with her daughter and work.  Past Medical History:  Diagnosis Date  . Allergy   . Anxiety   . Asthma   . Cancer ([place])    breast  . Depression   . Diverticulosis of sigmoid colon 07/14/2011  . GERD (gastroesophageal reflux disease)   . Hypertension   . Migraine   . PONV (postoperative nausea and vomiting)    Relevant past medical, surgical, family and social history reviewed and updated as indicated. Interim medical history since our last visit reviewed. Allergies and medications reviewed and updated. DATA REVIEWED: CHART IN EPIC  Family History reviewed for pertinent findings.  Review of Systems  Constitutional: Negative.   HENT: Negative.   Eyes: Negative.   Respiratory: Negative.   Gastrointestinal: Negative.   Genitourinary: Negative.   Psychiatric/Behavioral: Positive for decreased concentration and dysphoric mood. The patient is nervous/anxious.     Allergies as of 03/18/2018      Reactions   Iohexol Anaphylaxis   IBD dye      Medication List        Accurate as of 03/18/18 11:59 PM. Always use your most recent med list.          ALPRAZolam 1 MG tablet Commonly known as:  XANAX Take 1 tablet (1 mg total) by mouth 2 (two)  times daily as needed for anxiety.   buPROPion 150 MG 24 hr tablet Commonly known as:  WELLBUTRIN XL Take 3 tablets (450 mg total) by mouth daily.   doxycycline 100 MG tablet Commonly known as:  VIBRA-TABS Take 1 tablet (100 mg total) by mouth 2 (two) times daily. 1 po bid   FLUoxetine 10 MG capsule Commonly known as:  PROZAC Take 1 capsule (10 mg total) by mouth daily.   Fluticasone-Salmeterol 250-50 MCG/DOSE Aepb Commonly known as:  ADVAIR Inhale 1 puff into the lungs 2 (two) times daily.   furosemide 20 MG tablet Commonly known as:  LASIX TAKE 1 TABLET ONCE DAILY.   meclizine 25 MG tablet Commonly known as:  ANTIVERT Take 1 tablet (25 mg total) by mouth 3 (three) times daily as needed for dizziness.   montelukast 10 MG tablet Commonly known as:  SINGULAIR TAKE 1 TABLET ONCE DAILY IN THE EVENING.   olmesartan 40 MG tablet Commonly known as:  BENICAR TAKE 1 TABLET DAILY IN THE MORNING.   predniSONE 10 MG (21) Tbpk tablet Commonly known as:  STERAPRED UNI-PAK 21 TAB As directed x 6 days   SUMAtriptan 100 MG tablet Commonly known as:  IMITREX TAKE  1 TABLET ONCE DAILY AS NEEDED FOR MIGRAINE AS DIRECTED BY PHYSICIAN.   valACYclovir 1000 MG tablet Commonly known as:  VALTREX TAKE  (1)  TABLET  EVERY TWELVE HOURS.   VENTOLIN HFA 108 (90 Base) MCG/ACT inhaler Generic drug:  albuterol Inhale 2 puffs into the lungs every 6 (six) hours as needed for wheezing or shortness of breath.          Objective:    BP 134/88   Pulse 91   Temp 97.7 F (36.5 C) (Oral)   Ht 5\' 4"  (1.626 m)   Wt 213 lb (96.6 kg)   BMI 36.56 kg/m   Allergies  Allergen Reactions  . Iohexol Anaphylaxis    IBD dye    Wt Readings from Last 3 Encounters:  03/18/18 213 lb (96.6 kg)  02/18/18 205 lb (93 kg)  01/19/18 208 lb 6.4 oz (94.5 kg)    Physical Exam  Constitutional: She is oriented to person, place, and time. She appears well-developed and well-nourished.  HENT:  Head:  Normocephalic and atraumatic.  Eyes: Pupils are equal, round, and reactive to light. Conjunctivae and EOM are normal.  Cardiovascular: Normal rate, regular rhythm, normal heart sounds and intact distal pulses.  Pulmonary/Chest: Effort normal and breath sounds normal.  Abdominal: Soft. Bowel sounds are normal.  Neurological: She is alert and oriented to person, place, and time. She has normal reflexes.  Skin: Skin is warm and dry. No rash noted.  Psychiatric: She has a normal mood and affect. Her behavior is normal. Judgment and thought content normal.        Assessment & Plan:   1. Depression, major, single episode, mild (HCC) - FLUoxetine (PROZAC) 10 MG capsule; Take 1 capsule (10 mg total) by mouth daily.  Dispense: 30 capsule; Refill: 3  2. Acute recurrent maxillary sinusitis - doxycycline (VIBRA-TABS) 100 MG tablet; Take 1 tablet (100 mg total) by mouth 2 (two) times daily. 1 po bid  Dispense: 20 tablet; Refill: 0 - predniSONE (STERAPRED UNI-PAK 21 TAB) 10 MG (21) TBPK tablet; As directed x 6 days  Dispense: 21 tablet; Refill: 0  3. GAD (generalized anxiety disorder) - ALPRAZolam (XANAX) 1 MG tablet; Take 1 tablet (1 mg total) by mouth 2 (two) times daily as needed for anxiety.  Dispense: 60 tablet; Refill: 2   Continue all other maintenance medications as listed above.  Follow up plan: Return in about 4 weeks (around 04/15/2018) for recheck.  Educational handout given for [place] PA-C [place] [address]  [place], [place] [address] [phone]   03/22/2018, 10:38 PM

## 2018-04-05 ENCOUNTER — Other Ambulatory Visit: Payer: Self-pay | Admitting: Physician Assistant

## 2018-05-13 ENCOUNTER — Other Ambulatory Visit: Payer: Self-pay | Admitting: Physician Assistant

## 2018-07-05 ENCOUNTER — Other Ambulatory Visit: Payer: Self-pay | Admitting: Physician Assistant

## 2018-07-06 NOTE — Telephone Encounter (Signed)
Last seen 03/18/18

## 2018-07-26 ENCOUNTER — Other Ambulatory Visit: Payer: Self-pay | Admitting: Physician Assistant

## 2018-07-26 DIAGNOSIS — J0101 Acute recurrent maxillary sinusitis: Secondary | ICD-10-CM

## 2018-08-02 ENCOUNTER — Ambulatory Visit: Payer: 59 | Admitting: Family Medicine

## 2018-08-02 ENCOUNTER — Encounter: Payer: Self-pay | Admitting: Family Medicine

## 2018-08-02 VITALS — BP 131/80 | HR 82 | Temp 97.2°F | Ht 64.0 in | Wt 230.4 lb

## 2018-08-02 DIAGNOSIS — J069 Acute upper respiratory infection, unspecified: Secondary | ICD-10-CM | POA: Diagnosis not present

## 2018-08-02 DIAGNOSIS — K5792 Diverticulitis of intestine, part unspecified, without perforation or abscess without bleeding: Secondary | ICD-10-CM

## 2018-08-02 MED ORDER — CIPROFLOXACIN HCL 500 MG PO TABS: 500.0000 mg | ORAL_TABLET | Freq: Two times a day (BID) | ORAL | 0 refills | Status: AC

## 2018-08-02 MED ORDER — METRONIDAZOLE 500 MG PO TABS: 500.0000 mg | ORAL_TABLET | Freq: Two times a day (BID) | ORAL | 0 refills | Status: DC

## 2018-08-02 NOTE — Patient Instructions (Addendum)
Upper Respiratory Infection, Adult An upper respiratory infection (URI) affects the nose, throat, and upper air passages. URIs are caused by germs (viruses). The most common type of URI is often called "the common cold." Medicines cannot cure URIs, but you can do things at home to relieve your symptoms. URIs usually get better within 7-10 days. Follow these instructions at home: Activity  Rest as needed.  If you have a fever, stay home from work or school until your fever is gone, or until your doctor says you may return to work or school. ? You should stay home until you cannot spread the infection anymore (you are not contagious). ? Your doctor may have you wear a face mask so you have less risk of spreading the infection. Relieving symptoms  Gargle with a salt-water mixture 3-4 times a day or as needed. To make a salt-water mixture, completely dissolve -1 tsp of salt in 1 cup of warm water.  Use a cool-mist humidifier to add moisture to the air. This can help you breathe more easily. Eating and drinking   Drink enough fluid to keep your pee (urine) pale yellow.  Eat soups and other clear broths. General instructions   Take over-the-counter and prescription medicines only as told by your doctor. These include cold medicines, fever reducers, and cough suppressants.  Do not use any products that contain nicotine or tobacco. These include cigarettes and e-cigarettes. If you need help quitting, ask your doctor.  Avoid being where people are smoking (avoid secondhand smoke).  Make sure you get regular shots and get the flu shot every year.  Keep all follow-up visits as told by your doctor. This is important. How to avoid spreading infection to others   Wash your hands often with soap and water. If you do not have soap and water, use hand sanitizer.  Avoid touching your mouth, face, eyes, or nose.  Cough or sneeze into a tissue or your sleeve or elbow. Do not cough or sneeze  into your hand or into the air. Contact a doctor if:  You are getting worse, not better.  You have any of these: ? A fever. ? Chills. ? Brown or red mucus in your nose. ? Yellow or brown fluid (discharge)coming from your nose. ? Pain in your face, especially when you bend forward. ? Swollen neck glands. ? Pain with swallowing. ? White areas in the back of your throat. Get help right away if:  You have shortness of breath that gets worse.  You have very bad or constant: ? Headache. ? Ear pain. ? Pain in your forehead, behind your eyes, and over your cheekbones (sinus pain). ? Chest pain.  You have long-lasting (chronic) lung disease along with any of these: ? Wheezing. ? Long-lasting cough. ? Coughing up blood. ? A change in your usual mucus.  You have a stiff neck.  You have changes in your: ? Vision. ? Hearing. ? Thinking. ? Mood. Summary  An upper respiratory infection (URI) is caused by a germ called a virus. The most common type of URI is often called "the common cold."  URIs usually get better within 7-10 days.  Take over-the-counter and prescription medicines only as told by your doctor. This information is not intended to replace advice given to you by your health care provider. Make sure you discuss any questions you have with your health care provider. Document Released: 12/31/2007 Document Revised: 03/06/2017 Document Reviewed: 03/06/2017 Elsevier Interactive Patient Education  2019 [employer].  Diverticulitis  Diverticulitis is when small pockets in your large intestine (colon) get infected or swollen. This causes stomach pain and watery poop (diarrhea). These pouches are called diverticula. They form in people who have a condition called diverticulosis. Follow these instructions at home: Medicines  Take over-the-counter and prescription medicines only as told by your doctor. These include: ? Antibiotics. ? Pain medicines. ? Fiber  pills. ? Probiotics. ? Stool softeners.  Do not drive or use heavy machinery while taking prescription pain medicine.  If you were prescribed an antibiotic, take it as told. Do not stop taking it even if you feel better. General instructions   Follow a diet as told by your doctor.  When you feel better, your doctor may tell you to change your diet. You may need to eat a lot of fiber. Fiber makes it easier to poop (have bowel movements). Healthy foods with fiber include: ? Berries. ? Beans. ? Lentils. ? Green vegetables.  Exercise 3 or more times a week. Aim for 30 minutes each time. Exercise enough to sweat and make your heart beat faster.  Keep all follow-up visits as told. This is important. You may need to have an exam of the large intestine. This is called a colonoscopy. Contact a doctor if:  Your pain does not get better.  You have a hard time eating or drinking.  You are not pooping like normal. Get help right away if:  Your pain gets worse.  Your problems do not get better.  Your problems get worse very fast.  You have a fever.  You throw up (vomit) more than one time.  You have poop that is: ? Bloody. ? Black. ? Tarry. Summary  Diverticulitis is when small pockets in your large intestine (colon) get infected or swollen.  Take medicines only as told by your doctor.  Follow a diet as told by your doctor. This information is not intended to replace advice given to you by your health care provider. Make sure you discuss any questions you have with your health care provider. Document Released: 12/31/2007 Document Revised: 07/31/2016 Document Reviewed: 07/31/2016 Elsevier Interactive Patient Education  2019 [employer].

## 2018-08-02 NOTE — Progress Notes (Signed)
Subjective:    Patient ID: Deanna [name], female    DOB: 18-Aug-1960, 58 y.o.   MRN: [id number]  Chief Complaint:  Diarrhea and Fatigue   HPI: Deanna [name] is a 58 y.o. female presenting on 08/02/2018 for Diarrhea and Fatigue  Pt presents today with complaints of LLQ pain, diarrhea, fever, chills, and fatigue. Pt states the LLQ pain and diarrhea started around the holidays and have not improved. Pt states she did not follow her diet and feels this is a diverticulitis flare. Pt states she has noticed some bright red blood on the tissue after wiping. She denies blood in the stool, blood clots, ,melena, weakness, palpitations, dizziness, or shortness of breath.  States she has had about 4 days or upper respiratory symptoms including congestion, sore throat cough, and headache.   Relevant past medical, surgical, family, and social history reviewed and updated as indicated.  Allergies and medications reviewed and updated.   Past Medical History:  Diagnosis Date  . Allergy   . Anxiety   . Asthma   . Cancer ([place])    breast  . Depression   . Diverticulosis of sigmoid colon 07/14/2011  . GERD (gastroesophageal reflux disease)   . Hypertension   . Migraine   . PONV (postoperative nausea and vomiting)     Past Surgical History:  Procedure Laterality Date  . ABDOMINAL HYSTERECTOMY  02/2010   partial  . BREAST REDUCTION SURGERY  02/2011  . BREAST SURGERY     cancer  . CHOLECYSTECTOMY  07/17/11   w/IOC  . COLONOSCOPY    . NASAL SINUS SURGERY  02/2011  . TONSILLECTOMY  age 22    Social History   Socioeconomic History  . Marital status: Divorced    Spouse name: Not on file  . Number of children: 1  . Years of education: Not on file  . Highest education level: Not on file  Occupational History  . Occupation: Scientist, physiological with [employer]  . Financial resource strain: Not on file  . Food insecurity:    Worry: Not on file    Inability: Not on file  .  Transportation needs:    Medical: Not on file    Non-medical: Not on file  Tobacco Use  . Smoking status: Former Smoker    Packs/day: 0.25    Years: 2.00    Pack years: 0.50    Last attempt to quit: 07/28/2000    Years since quitting: 18.0  . Smokeless tobacco: Never Used  Substance and Sexual Activity  . Alcohol use: No  . Drug use: No  . Sexual activity: Not on file  Lifestyle  . Physical activity:    Days per week: Not on file    Minutes per session: Not on file  . Stress: Not on file  Relationships  . Social connections:    Talks on phone: Not on file    Gets together: Not on file    Attends religious service: Not on file    Active member of club or organization: Not on file    Attends meetings of clubs or organizations: Not on file    Relationship status: Not on file  . Intimate partner violence:    Fear of current or ex partner: Not on file    Emotionally abused: Not on file    Physically abused: Not on file    Forced sexual activity: Not on file  Other Topics Concern  .  Not on file  Social History Narrative  . Not on file    Outpatient Encounter Medications as of 08/02/2018  Medication Sig  . ALPRAZolam (XANAX) 1 MG tablet Take 1 tablet (1 mg total) by mouth 2 (two) times daily as needed for anxiety.  [name] buPROPion (WELLBUTRIN XL) 150 MG 24 hr tablet Take 3 tablets (450 mg total) by mouth daily.  [name] FLUoxetine (PROZAC) 10 MG capsule Take 1 capsule (10 mg total) by mouth daily.  . Fluticasone-Salmeterol (ADVAIR DISKUS) 250-50 MCG/DOSE AEPB Inhale 1 puff into the lungs 2 (two) times daily.  . furosemide (LASIX) 20 MG tablet TAKE 1 TABLET ONCE DAILY.  . meclizine (ANTIVERT) 25 MG tablet Take 1 tablet (25 mg total) by mouth 3 (three) times daily as needed for dizziness.  . montelukast (SINGULAIR) 10 MG tablet TAKE 1 TABLET ONCE DAILY IN THE EVENING.  [name] olmesartan (BENICAR) 40 MG tablet TAKE 1 TABLET DAILY IN THE MORNING.  [name] phentermine (ADIPEX-P) 37.5 MG tablet TAKE (1)  TABLET DAILY IN THE MORNING.  . predniSONE (STERAPRED UNI-PAK 21 TAB) 10 MG (21) TBPK tablet As directed x 6 days  . SUMAtriptan (IMITREX) 100 MG tablet TAKE 1 TABLET ONCE DAILY AS NEEDED FOR MIGRAINE AS DIRECTED BY PHYSICIAN.  . valACYclovir (VALTREX) 1000 MG tablet TAKE  (1)  TABLET  EVERY TWELVE HOURS.  . VENTOLIN HFA 108 (90 Base) MCG/ACT inhaler Inhale 2 puffs into the lungs every 6 (six) hours as needed for wheezing or shortness of breath.  . ciprofloxacin (CIPRO) 500 MG tablet Take 1 tablet (500 mg total) by mouth 2 (two) times daily for 7 days.  . metroNIDAZOLE (FLAGYL) 500 MG tablet Take 1 tablet (500 mg total) by mouth 2 (two) times daily.  . [DISCONTINUED] doxycycline (VIBRA-TABS) 100 MG tablet Take 1 tablet (100 mg total) by mouth 2 (two) times daily. 1 po bid   No facility-administered encounter medications on file as of 08/02/2018.     Allergies  Allergen Reactions  . Iohexol Anaphylaxis    IBD dye    Review of Systems  Constitutional: Positive for appetite change, chills, fatigue and fever.  HENT: Positive for congestion, rhinorrhea and sore throat. Negative for sinus pressure and sinus pain.   Respiratory: Positive for cough. Negative for chest tightness and shortness of breath.   Cardiovascular: Negative for chest pain and palpitations.  Gastrointestinal: Positive for abdominal pain (LLQ), blood in stool (minimal bright red, only on tissue) and diarrhea. Negative for abdominal distention, anal bleeding, constipation, nausea, rectal pain and vomiting.  Neurological: Positive for headaches. Negative for weakness and light-headedness.  Psychiatric/Behavioral: Negative for confusion.  All other systems reviewed and are negative.       Objective:    BP 131/80   Pulse 82   Temp (!) 97.2 F (36.2 C) (Oral)   Ht 5\' 4"  (1.626 m)   Wt 230 lb 6.4 oz (104.5 kg)   BMI 39.55 kg/m    Wt Readings from Last 3 Encounters:  08/02/18 230 lb 6.4 oz (104.5 kg)  03/18/18 213 lb  (96.6 kg)  02/18/18 205 lb (93 kg)    Physical Exam Vitals signs and nursing note reviewed.  Constitutional:      General: She is in acute distress (mild).     Appearance: Normal appearance. She is well-developed and well-groomed. She is obese. She is not ill-appearing or toxic-appearing.  HENT:     Head: Normocephalic and atraumatic.     Right Ear: Ear canal and  external ear normal. A middle ear effusion is present. Tympanic membrane is not perforated or erythematous.     Left Ear: Ear canal and external ear normal. A middle ear effusion is present. Tympanic membrane is not perforated or erythematous.     Nose: Nose normal.     Right Sinus: No maxillary sinus tenderness or frontal sinus tenderness.     Left Sinus: No maxillary sinus tenderness or frontal sinus tenderness.     Mouth/Throat:     Lips: Pink.     Mouth: Mucous membranes are moist.     Pharynx: Uvula midline. Posterior oropharyngeal erythema present. No pharyngeal swelling, oropharyngeal exudate or uvula swelling.     Tonsils: No tonsillar exudate or tonsillar abscesses.  Neck:     Musculoskeletal: Neck supple.     Trachea: Trachea and phonation normal.  Cardiovascular:     Rate and Rhythm: Normal rate and regular rhythm.     Heart sounds: Normal heart sounds. No murmur. No friction rub. No gallop.   Pulmonary:     Effort: Pulmonary effort is normal.     Breath sounds: Normal breath sounds.  Abdominal:     General: Abdomen is protuberant. Bowel sounds are normal. There is no distension.     Palpations: Abdomen is soft.     Tenderness: There is abdominal tenderness in the left lower quadrant. There is guarding. There is no right CVA tenderness, left CVA tenderness or rebound. Negative signs include Murphy's sign, Rovsing's sign, McBurney's sign, psoas sign and obturator sign.  Skin:    General: Skin is warm and dry.     Capillary Refill: Capillary refill takes less than 2 seconds.  Neurological:     Mental Status:  She is alert and oriented to person, place, and time.  Psychiatric:        Mood and Affect: Mood normal.        Behavior: Behavior normal. Behavior is cooperative.        Thought Content: Thought content normal.        Judgment: Judgment normal.     Results for orders placed or performed in visit on 03/31/18  HM MAMMOGRAPHY  Result Value Ref Range   HM Mammogram 0-4 Bi-Rad 0-4 Bi-Rad, Self Reported Normal       Pertinent labs & imaging results that were available during my care of the patient were reviewed by me and considered in my medical decision making.  Assessment & Plan:  [place] was seen today for diarrhea and fatigue.  Diagnoses and all orders for this visit:  Diverticulitis Due to signs and symptoms will treat with oral antibiotics. Pt to return in 10 days or sooner if symptoms fail to improve or become worse. May need a CT if no improvement. Modify diet as discussed. Adequate fluid intake. Medications as prescribed.  -     ciprofloxacin (CIPRO) 500 MG tablet; Take 1 tablet (500 mg total) by mouth 2 (two) times daily for 7 days. -     metroNIDAZOLE (FLAGYL) 500 MG tablet; Take 1 tablet (500 mg total) by mouth 2 (two) times daily.  URI with cough and congestion Increase fluid intake and humidity in the air. Tylenol as needed for fever and pain control. Can use over the counter Mucinex if beneficial. Report any new or worsening symptoms.     Continue all other maintenance medications.  Follow up plan: Return in about 10 days (around 08/12/2018), or if symptoms worsen or fail to improve.  Educational handout  given for diverticulitis, URI  The above assessment and management plan was discussed with the patient. The patient verbalized understanding of and has agreed to the management plan. Patient is aware to call the clinic if symptoms persist or worsen. Patient is aware when to return to the clinic for a follow-up visit. Patient educated on when it is appropriate to go to  the emergency department.   Monia Pouch, FNP-C [facility] [phone]

## 2018-08-09 ENCOUNTER — Ambulatory Visit: Payer: 59 | Admitting: Family Medicine

## 2018-08-09 ENCOUNTER — Encounter: Payer: Self-pay | Admitting: Family Medicine

## 2018-08-09 VITALS — BP 130/80 | HR 72 | Temp 97.5°F | Ht 64.0 in | Wt 228.0 lb

## 2018-08-09 DIAGNOSIS — H6593 Unspecified nonsuppurative otitis media, bilateral: Secondary | ICD-10-CM

## 2018-08-09 DIAGNOSIS — J069 Acute upper respiratory infection, unspecified: Secondary | ICD-10-CM

## 2018-08-09 MED ORDER — METHYLPREDNISOLONE ACETATE 80 MG/ML IJ SUSP
80.0000 mg | Freq: Once | INTRAMUSCULAR | Status: AC
  Administered 2018-08-09: 80 mg via INTRAMUSCULAR

## 2018-08-09 NOTE — Patient Instructions (Signed)
Otitis Media With Effusion, Pediatric    Otitis media with effusion (OME) occurs when there is inflammation of the middle ear and fluid in the middle ear space. There are no signs and symptoms of infection. The middle ear space contains air and the bones for hearing. Air in the middle ear space helps to transmit sound to the brain.  OME is a common condition in children, and it often occurs after an ear infection. This condition may be present for several weeks or longer after an ear infection. Most cases of this condition get better on their own.  What are the causes?  OME is caused by a blockage of the eustachian tube in one or both ears. These tubes drain fluid in the ears to the back of the nose (nasopharynx). If the tissue in the tube swells up (edema), the tube closes. This prevents fluid from draining. Blockage can be caused by:  · Ear infections.  · Colds and other upper respiratory infections.  · Allergies.  · Irritants, such as tobacco smoke.  · Enlarged adenoids. The adenoids are areas of soft tissue located high in the back of the throat, behind the nose and the roof of the mouth. They are part of the body’s natural defense (immune) system.  · A mass in the nasopharynx.  · Damage to the ear caused by pressure changes (barotrauma).  What increases the risk?  Your child is more likely to develop this condition if:  · He or she has repeated ear and sinus infections.  · He or she has allergies.  · He or she is exposed to tobacco smoke.  · He or she attends daycare.  · He or she is not breastfed.  What are the signs or symptoms?  Symptoms of this condition may not be obvious. Sometimes this condition does not have any symptoms, or symptoms may overlap with those of a cold or upper respiratory tract illness.  Symptoms of this condition include:  · Temporary hearing loss.  · A feeling of fullness in the ear without pain.  · Irritability or agitation.  · Balance (vestibular) problems.  As a result of hearing  loss, your child may:  · Listen to the TV at a loud volume.  · Not respond to questions.  · Ask "What?" often when spoken to.  · Mistake or confuse one sound or word for another.  · Perform poorly at school.  · Have a poor attention span.  · Become agitated or irritated easily.  How is this diagnosed?  This condition is diagnosed with an ear exam. Your child's health care provider will look inside your child's ear with an instrument (otoscope) to check for redness, swelling, and fluid.  Other tests may be done, including:  · A test to check the movement of the eardrum (pneumatic otoscopy). This is done by squeezing a small amount of air into the ear.  · A test that changes air pressure in the middle ear to check how well the eardrum moves and to see if the eustachian tube is working (tympanogram).  · Hearing test (audiogram). This test involves playing tones at different pitches to see if your child can hear each tone.  How is this treated?  Treatment for this condition depends on the cause. In many cases, the fluid goes away on its own.  In some cases, your child may need a procedure to create a hole in the eardrum to allow fluid to drain (myringotomy) and to   insert small drainage tubes (tympanostomy tubes) into the eardrums. These tubes help to drain fluid and prevent infection. This procedure may be recommended if:  · OME does not get better over several months.  · Your child has many ear infections within several months.  · Your child has noticeable hearing loss.  · Your child has problems with speech and language development.  Surgery may also be done to remove the adenoids (adenoidectomy).  Follow these instructions at home:  · Give over-the-counter and prescription medicines only as told by your child's health care provider.  · Keep children away from any tobacco smoke.  · Keep all follow-up visits as told by your child's health care provider. This is important.  How is this prevented?  · Keep your child's  vaccinations up to date. Make sure your child gets all recommended vaccinations, including a pneumonia and flu vaccine.  · Encourage hand washing. Your child should wash his or her hands often with soap and water. If there is no soap and water, he or she should use hand sanitizer.  · Avoid exposing your child to tobacco smoke.  · Breastfeed your baby, if possible. Babies who are breastfed as long as possible are less likely to develop this condition.  Contact a health care provider if:  · Your child's hearing does not get better after 3 months.  · Your child's hearing is worse.  · Your child has ear pain.  · Your child has a fever.  · Your child has drainage from the ear.  · Your child is dizzy.  · Your child has a lump on his or her neck.  Get help right away if:  · Your child has bleeding from the nose.  · Your child cannot move part of her or his face.  · Your child has trouble breathing.  · Your child cannot smell.  · Your child develops severe congestion.  · Your child develops weakness.  · Your child who is younger than 3 months has a temperature of 100°F (38°C) or higher.  Summary  · Otitis media with effusion (OME) occurs when there is inflammation of the middle ear and fluid in the middle ear space.  · This condition is caused by blockage of one or both eustachian tubes, which drain fluid in the ears to the back of the nose.  · Symptoms of this condition can include temporary hearing loss, a feeling of fullness in the ear, irritability or agitation, and balance (vestibular) problems. Sometimes, there are no symptoms.  · This condition is diagnosed with an ear exam and tests, such as pneumatic otoscopy, tympanogram, and audiogram.  · Treatment for this condition depends on the cause. In many cases, the fluid goes away on its own.  This information is not intended to replace advice given to you by your health care provider. Make sure you discuss any questions you have with your health care provider.  Document  Released: 10/04/2003 Document Revised: 06/05/2016 Document Reviewed: 06/05/2016  Elsevier Interactive Patient Education © 2019 [employer]

## 2018-08-09 NOTE — Progress Notes (Signed)
    Subjective:     Deanna [name] is a 58 y.o. female who presents for evaluation of symptoms of a URI, bilateral ear fullness. Symptoms include bilateral ear pressure/pain, congestion, cough described as nonproductive and headache described as aching. Onset of symptoms was 4 days ago, and has been unchanged since that time. Treatment to date: decongestants and nasal steroids.  The following portions of the patient's history were reviewed and updated as appropriate: allergies, current medications, past family history, past medical history, past social history, past surgical history and problem list.  Review of Systems Constitutional: positive for fatigue Eyes: negative Ears, nose, mouth, throat, and face: positive for nasal congestion and ear fullness Respiratory: positive for cough Cardiovascular: negative Neurological: positive for headaches and intermittent dizziness   Objective:    BP 130/80   Pulse 72   Temp (!) 97.5 F (36.4 C) (Oral)   Ht 5\' 4"  (1.626 m)   Wt 228 lb (103.4 kg)   BMI 39.14 kg/m  General appearance: alert, cooperative and mild distress Head: Normocephalic, without obvious abnormality, atraumatic Eyes: negative Ears: abnormal TM right ear - serous middle ear fluid and abnormal TM left ear - serous middle ear fluid Nose: mild congestion, turbinates red, swollen, no sinus tenderness Throat: lips, mucosa, and tongue normal; teeth and gums normal Neck: no adenopathy, no carotid bruit, no JVD, supple, symmetrical, trachea midline and thyroid not enlarged, symmetric, no tenderness/mass/nodules Lungs: clear to auscultation bilaterally Heart: regular rate and rhythm, S1, S2 normal, no murmur, click, rub or gallop Skin: Skin color, texture, turgor normal. No rashes or lesions Neurologic: Grossly normal   Assessment:     P[place] was seen today for uri.  Diagnoses and all orders for this visit:  Bilateral otitis media with effusion Continue Flonase, add Zyrtec  nightly. Increase fluid intake. Avoid cigarette smoke. Report any new or worsening symptoms.   URI with cough and congestion Symptomatic care discussed. Can use over the counter Mucinex. Increase fluid intake. Report any new or worsening symptoms.  -     methylPREDNISolone acetate (DEPO-MEDROL) injection 80 mg     Plan:    Discussed diagnosis and treatment of URI. Discussed the importance of avoiding unnecessary antibiotic therapy. Suggested symptomatic OTC remedies. Nasal saline spray for congestion. Nasal steroids per orders. Follow up as needed.    The above assessment and management plan was discussed with the patient. The patient verbalized understanding of and has agreed to the management plan. Patient is aware to call the clinic if symptoms fail to improve or worsen. Patient is aware when to return to the clinic for a follow-up visit. Patient educated on when it is appropriate to go to the emergency department.   Monia Pouch, FNP-C [facility] [address] [place], [place] [address] [phone]

## 2018-08-23 DIAGNOSIS — Z0289 Encounter for other administrative examinations: Secondary | ICD-10-CM

## 2018-09-13 ENCOUNTER — Encounter: Payer: Self-pay | Admitting: Physician Assistant

## 2018-09-13 ENCOUNTER — Ambulatory Visit (INDEPENDENT_AMBULATORY_CARE_PROVIDER_SITE_OTHER): Payer: 59 | Admitting: Physician Assistant

## 2018-09-13 VITALS — BP 123/79 | HR 80 | Temp 97.7°F | Ht 64.0 in | Wt 226.4 lb

## 2018-09-13 DIAGNOSIS — J0101 Acute recurrent maxillary sinusitis: Secondary | ICD-10-CM

## 2018-09-13 DIAGNOSIS — R5382 Chronic fatigue, unspecified: Secondary | ICD-10-CM | POA: Diagnosis not present

## 2018-09-13 DIAGNOSIS — R109 Unspecified abdominal pain: Secondary | ICD-10-CM

## 2018-09-13 DIAGNOSIS — F411 Generalized anxiety disorder: Secondary | ICD-10-CM

## 2018-09-13 DIAGNOSIS — G8929 Other chronic pain: Secondary | ICD-10-CM | POA: Insufficient documentation

## 2018-09-13 DIAGNOSIS — K5792 Diverticulitis of intestine, part unspecified, without perforation or abscess without bleeding: Secondary | ICD-10-CM | POA: Diagnosis not present

## 2018-09-13 MED ORDER — ALPRAZOLAM 1 MG PO TABS: 1.0000 mg | ORAL_TABLET | Freq: Two times a day (BID) | ORAL | 2 refills | Status: DC | PRN

## 2018-09-13 MED ORDER — LEVOFLOXACIN 500 MG PO TABS: 500.0000 mg | ORAL_TABLET | Freq: Every day | ORAL | 1 refills | Status: DC

## 2018-09-13 MED ORDER — OMEPRAZOLE 20 MG PO CPDR: 20.0000 mg | DELAYED_RELEASE_CAPSULE | Freq: Every day | ORAL | 3 refills | Status: DC

## 2018-09-13 NOTE — Progress Notes (Signed)
BP 123/79   Pulse 80   Temp 97.7 F (36.5 C) (Oral)   Ht _0  (1.626 m)   Wt 226 lb 6.4 oz (102.7 kg)   BMI 38.86 kg/m    Subjective:    Patient ID: Deanna [name], female    DOB: 10-18-1960, 58 y.o.   MRN: [id number]  HPI: Deanna [name] is a 58 y.o. female presenting on 09/13/2018 for Depression (6 month follow up ); Anxiety; and Fatigue  This patient comes in for periodic recheck on medications and conditions including diverticulitis, sinusitis, fatigue, chronic abdominal, GAD.    This patient was diagnosed with diverticular disease throughout her colon.  In her November 2019 colonoscopy was discovered.  Previously she had only had some in the descending colon.  She still will get diverticular infections.  She is also having a very hard time working on a diet that keeps her normalized.  Fiber keeps her too loose and potatoes gets her to tight.  She is concerned about the chronic condition.  This patient has had many days of sinus headache and postnasal drainage. There is copious drainage at times. Denies any fever at this time. There has been a history of sinus infections in the past.  No history of sinus surgery. There is cough at night. It has become more prevalent in recent days.   All medications are reviewed today. There are no reports of any problems with the medications. All of the medical conditions are reviewed and updated.  Lab work is reviewed and will be ordered as medically necessary. There are no new problems reported with today's visit.   Past Medical History:  Diagnosis Date  . Allergy   . Anxiety   . Asthma   . Cancer ([place])    breast  . Depression   . Diverticulosis of sigmoid colon 07/14/2011  . GERD (gastroesophageal reflux disease)   . Hypertension   . Migraine   . PONV (postoperative nausea and vomiting)    Relevant past medical, surgical, family and social history reviewed and updated as indicated. Interim medical history since our last visit  reviewed. Allergies and medications reviewed and updated. DATA REVIEWED: CHART IN EPIC  Family History reviewed for pertinent findings.  Review of Systems  Constitutional: Positive for chills and fatigue. Negative for activity change and appetite change.  HENT: Positive for congestion, postnasal drip and sore throat.   Eyes: Negative.   Respiratory: Positive for cough and wheezing.   Cardiovascular: Negative.  Negative for chest pain, palpitations and leg swelling.  Gastrointestinal: Positive for abdominal distention and diarrhea.  Genitourinary: Negative.   Musculoskeletal: Negative.   Skin: Negative.   Neurological: Positive for headaches.    Allergies as of 09/13/2018      Reactions   Iohexol Anaphylaxis   IBD dye      Medication List       Accurate as of September 13, 2018  1:58 PM. Always use your most recent med list.        ALPRAZolam 1 MG tablet Commonly known as:  XANAX Take 1 tablet (1 mg total) by mouth 2 (two) times daily as needed for anxiety.   buPROPion 150 MG 24 hr tablet Commonly known as:  WELLBUTRIN XL Take 3 tablets (450 mg total) by mouth daily.   Fluticasone-Salmeterol 250-50 MCG/DOSE Aepb Commonly known as:  ADVAIR DISKUS Inhale 1 puff into the lungs 2 (two) times daily.   furosemide 20 MG tablet Commonly known as:  LASIX  TAKE 1 TABLET ONCE DAILY.   levofloxacin 500 MG tablet Commonly known as:  LEVAQUIN Take 1 tablet (500 mg total) by mouth daily.   meclizine 25 MG tablet Commonly known as:  ANTIVERT Take 1 tablet (25 mg total) by mouth 3 (three) times daily as needed for dizziness.   montelukast 10 MG tablet Commonly known as:  SINGULAIR TAKE 1 TABLET ONCE DAILY IN THE EVENING.   olmesartan 40 MG tablet Commonly known as:  BENICAR TAKE 1 TABLET DAILY IN THE MORNING.   omeprazole 20 MG capsule Commonly known as:  PRILOSEC Take 1 capsule (20 mg total) by mouth daily.   SUMAtriptan 100 MG tablet Commonly known as:  IMITREX TAKE  1 TABLET ONCE DAILY AS NEEDED FOR MIGRAINE AS DIRECTED BY PHYSICIAN.   valACYclovir 1000 MG tablet Commonly known as:  VALTREX TAKE  (1)  TABLET  EVERY TWELVE HOURS.   VENTOLIN HFA 108 (90 Base) MCG/ACT inhaler Generic drug:  albuterol Inhale 2 puffs into the lungs every 6 (six) hours as needed for wheezing or shortness of breath.          Objective:    BP 123/79   Pulse 80   Temp 97.7 F (36.5 C) (Oral)   Ht _0  (1.626 m)   Wt 226 lb 6.4 oz (102.7 kg)   BMI 38.86 kg/m   Allergies  Allergen Reactions  . Iohexol Anaphylaxis    IBD dye    Wt Readings from Last 3 Encounters:  09/13/18 226 lb 6.4 oz (102.7 kg)  08/09/18 228 lb (103.4 kg)  08/02/18 230 lb 6.4 oz (104.5 kg)    Physical Exam Constitutional:      Appearance: She is well-developed.  HENT:     Head: Normocephalic and atraumatic.     Right Ear: Tympanic membrane and external ear normal. No middle ear effusion.     Left Ear: Tympanic membrane and external ear normal.  No middle ear effusion.     Nose: Mucosal edema and rhinorrhea present.     Right Sinus: No maxillary sinus tenderness.     Left Sinus: No maxillary sinus tenderness.     Mouth/Throat:     Pharynx: Uvula midline. Posterior oropharyngeal erythema present.  Eyes:     General:        Right eye: No discharge.        Left eye: No discharge.     Conjunctiva/sclera: Conjunctivae normal.     Pupils: Pupils are equal, round, and reactive to light.  Neck:     Musculoskeletal: Normal range of motion.  Cardiovascular:     Rate and Rhythm: Normal rate and regular rhythm.     Heart sounds: Normal heart sounds.  Pulmonary:     Effort: Pulmonary effort is normal. No respiratory distress.     Breath sounds: Normal breath sounds. No wheezing.  Abdominal:     Palpations: Abdomen is soft.  Lymphadenopathy:     Cervical: No cervical adenopathy.  Skin:    General: Skin is warm and dry.  Neurological:     Mental Status: She is alert and oriented to  person, place, and time.     Results for orders placed or performed in visit on 03/31/18  HM MAMMOGRAPHY  Result Value Ref Range   HM Mammogram 0-4 Bi-Rad 0-4 Bi-Rad, Self Reported Normal      Assessment & Plan:   1. Diverticulitis - CBC with Differential/Platelet - CMP14+EGFR - H Pylori, IGM, IGG, IGA AB -  Food Allergy Profile  2. Acute recurrent maxillary sinusitis - CMP14+EGFR  3. Chronic fatigue - CBC with Differential/Platelet - CMP14+EGFR - Lipid panel - Thyroid Panel With TSH - H Pylori, IGM, IGG, IGA AB - Food Allergy Profile  4. Chronic abdominal pain - omeprazole (PRILOSEC) 20 MG capsule; Take 1 capsule (20 mg total) by mouth daily.  Dispense: 30 capsule; Refill: 3 - Food Allergy Profile  5. GAD (generalized anxiety disorder) - ALPRAZolam (XANAX) 1 MG tablet; Take 1 tablet (1 mg total) by mouth 2 (two) times daily as needed for anxiety.  Dispense: 60 tablet; Refill: 2   Continue all other maintenance medications as listed above.  Follow up plan: Return in about 4 weeks (around 10/11/2018).  Educational handout given for [place] PA-C [facility] [address]  [place], [place] [address] [phone]   09/13/2018, 1:58 PM

## 2018-09-15 LAB — LIPID PANEL
Chol/HDL Ratio: 3.5 ratio (ref 0.0–4.4)
Cholesterol, Total: 191 mg/dL (ref 100–199)
HDL: 54 mg/dL (ref >39)
LDL Calculated: 124 mg/dL — ABNORMAL HIGH (ref 0–99)
TRIGLYCERIDES: 65 mg/dL (ref 0–149)
VLDL Cholesterol Cal: 13 mg/dL (ref 5–40)

## 2018-09-15 LAB — CMP14+EGFR
ALT: 22 IU/L (ref 0–32)
AST: 22 IU/L (ref 0–40)
Albumin/Globulin Ratio: 1.9 (ref 1.2–2.2)
Albumin: 4.2 g/dL (ref 3.8–4.9)
Alkaline Phosphatase: 94 IU/L (ref 39–117)
BUN/Creatinine Ratio: 22 (ref 9–23)
BUN: 19 mg/dL (ref 6–24)
Bilirubin Total: 0.3 mg/dL (ref 0.0–1.2)
CO2: 21 mmol/L (ref 20–29)
Calcium: 8.9 mg/dL (ref 8.7–10.2)
Chloride: 105 mmol/L (ref 96–106)
Creatinine, Ser: 0.88 mg/dL (ref 0.57–1.00)
GFR calc Af Amer: 85 mL/min/{1.73_m2} (ref >59)
GFR calc non Af Amer: 74 mL/min/{1.73_m2} (ref >59)
Globulin, Total: 2.2 g/dL (ref 1.5–4.5)
Glucose: 90 mg/dL (ref 65–99)
Potassium: 4.2 mmol/L (ref 3.5–5.2)
Sodium: 141 mmol/L (ref 134–144)
Total Protein: 6.4 g/dL (ref 6.0–8.5)

## 2018-09-15 LAB — FOOD ALLERGY PROFILE
Allergen Corn, IgE: 0.59 kU/L — AB
Codfish IgE: <0.1 kU/L
Egg White IgE: <0.1 kU/L
Milk IgE: 0.1 kU/L — AB
Peanut IgE: 1.61 kU/L — AB
Scallop IgE: 0.39 kU/L — AB
Sesame Seed IgE: 1.42 kU/L — AB
Shrimp IgE: <0.1 kU/L
Soybean IgE: 0.59 kU/L — AB
Walnut IgE: 0.34 kU/L — AB
Wheat IgE: 1.33 kU/L — AB

## 2018-09-15 LAB — CBC WITH DIFFERENTIAL/PLATELET
Basophils Absolute: 0.1 10*3/uL (ref 0.0–0.2)
Basos: 1 %
EOS (ABSOLUTE): 0.1 10*3/uL (ref 0.0–0.4)
EOS: 2 %
Hematocrit: 43.7 % (ref 34.0–46.6)
Hemoglobin: 14.8 g/dL (ref 11.1–15.9)
Immature Grans (Abs): 0 10*3/uL (ref 0.0–0.1)
Immature Granulocytes: 0 %
LYMPHS ABS: 2.2 10*3/uL (ref 0.7–3.1)
Lymphs: 28 %
MCH: 30 pg (ref 26.6–33.0)
MCHC: 33.9 g/dL (ref 31.5–35.7)
MCV: 89 fL (ref 79–97)
MONOCYTES: 10 %
Monocytes Absolute: 0.8 10*3/uL (ref 0.1–0.9)
Neutrophils Absolute: 4.6 10*3/uL (ref 1.4–7.0)
Neutrophils: 59 %
Platelets: 332 10*3/uL (ref 150–450)
RBC: 4.93 x10E6/uL (ref 3.77–5.28)
RDW: 13.8 % (ref 11.7–15.4)
WBC: 7.7 10*3/uL (ref 3.4–10.8)

## 2018-09-15 LAB — THYROID PANEL WITH TSH
Free Thyroxine Index: 1.5 (ref 1.2–4.9)
T3 Uptake Ratio: 22 % — ABNORMAL LOW (ref 24–39)
T4, Total: 6.6 ug/dL (ref 4.5–12.0)
TSH: 3.78 u[IU]/mL (ref 0.450–4.500)

## 2018-09-15 LAB — H PYLORI, IGM, IGG, IGA AB
H pylori, IgM Abs: <9 units (ref 0.0–8.9)
H. pylori, IgG AbS: 0.2 Index Value (ref 0.00–0.79)

## 2018-09-16 ENCOUNTER — Telehealth: Payer: Self-pay | Admitting: Physician Assistant

## 2018-09-16 NOTE — Telephone Encounter (Signed)
Aware of lab results  

## 2018-10-07 ENCOUNTER — Encounter: Payer: Self-pay | Admitting: *Deleted

## 2018-10-08 ENCOUNTER — Telehealth: Payer: Self-pay | Admitting: Physician Assistant

## 2018-10-08 ENCOUNTER — Other Ambulatory Visit: Payer: Self-pay | Admitting: Physician Assistant

## 2018-10-08 MED ORDER — CIPROFLOXACIN HCL 500 MG PO TABS: 500.0000 mg | ORAL_TABLET | Freq: Two times a day (BID) | ORAL | 0 refills | Status: DC

## 2018-10-08 MED ORDER — METRONIDAZOLE 500 MG PO TABS: 500.0000 mg | ORAL_TABLET | Freq: Three times a day (TID) | ORAL | 0 refills | Status: DC

## 2018-10-08 NOTE — Telephone Encounter (Signed)
Flagyl and Cipro are sent to the pharmacy.

## 2018-10-08 NOTE — Telephone Encounter (Signed)
PT states that her diverticulitis is acting up, levaquin was sent in due to sinus infection and diverticulitis, pt states that the levaquin helped some but not with the diverticulitis. States that the cipro and flagial normally helps with the diverticulitis. She is wanting to know if something else can be sent in or does she just need to come back in to be evaluated, she has an apt on 3/25 w/AJ  [place]

## 2018-10-11 ENCOUNTER — Other Ambulatory Visit: Payer: 59

## 2018-10-11 ENCOUNTER — Encounter: Payer: Self-pay | Admitting: Internal Medicine

## 2018-10-11 ENCOUNTER — Other Ambulatory Visit (INDEPENDENT_AMBULATORY_CARE_PROVIDER_SITE_OTHER): Payer: 59

## 2018-10-11 ENCOUNTER — Other Ambulatory Visit: Payer: Self-pay

## 2018-10-11 ENCOUNTER — Ambulatory Visit: Payer: 59 | Admitting: Internal Medicine

## 2018-10-11 VITALS — BP 140/90 | HR 80 | Ht 64.0 in | Wt 223.2 lb

## 2018-10-11 DIAGNOSIS — R109 Unspecified abdominal pain: Secondary | ICD-10-CM

## 2018-10-11 DIAGNOSIS — K5732 Diverticulitis of large intestine without perforation or abscess without bleeding: Secondary | ICD-10-CM

## 2018-10-11 LAB — COMPREHENSIVE METABOLIC PANEL
ALT: 58 U/L — AB (ref 0–35)
AST: 70 U/L — ABNORMAL HIGH (ref 0–37)
Albumin: 4.1 g/dL (ref 3.5–5.2)
Alkaline Phosphatase: 79 U/L (ref 39–117)
BILIRUBIN TOTAL: 0.4 mg/dL (ref 0.2–1.2)
BUN: 9 mg/dL (ref 6–23)
CO2: 25 mEq/L (ref 19–32)
Calcium: 9 mg/dL (ref 8.4–10.5)
Chloride: 105 mEq/L (ref 96–112)
Creatinine, Ser: 1.01 mg/dL (ref 0.40–1.20)
GFR: 56.48 mL/min — ABNORMAL LOW (ref >60.00)
Glucose, Bld: 88 mg/dL (ref 70–99)
Potassium: 3.6 mEq/L (ref 3.5–5.1)
Sodium: 139 mEq/L (ref 135–145)
Total Protein: 6.8 g/dL (ref 6.0–8.3)

## 2018-10-11 LAB — CBC WITH DIFFERENTIAL/PLATELET
BASOS ABS: 0 10*3/uL (ref 0.0–0.1)
Basophils Relative: 0.8 % (ref 0.0–3.0)
Eosinophils Absolute: 0.1 10*3/uL (ref 0.0–0.7)
Eosinophils Relative: 2.2 % (ref 0.0–5.0)
HCT: 43.7 % (ref 36.0–46.0)
Hemoglobin: 14.8 g/dL (ref 12.0–15.0)
Lymphocytes Relative: 31 % (ref 12.0–46.0)
Lymphs Abs: 1.9 10*3/uL (ref 0.7–4.0)
MCHC: 34 g/dL (ref 30.0–36.0)
MCV: 88.8 fl (ref 78.0–100.0)
Monocytes Absolute: 0.6 10*3/uL (ref 0.1–1.0)
Monocytes Relative: 10.2 % (ref 3.0–12.0)
Neutro Abs: 3.5 10*3/uL (ref 1.4–7.7)
Neutrophils Relative %: 55.8 % (ref 43.0–77.0)
Platelets: 320 10*3/uL (ref 150.0–400.0)
RBC: 4.92 Mil/uL (ref 3.87–5.11)
RDW: 14.3 % (ref 11.5–15.5)
WBC: 6.2 10*3/uL (ref 4.0–10.5)

## 2018-10-11 LAB — IGA: IgA: 206 mg/dL (ref 68–378)

## 2018-10-11 MED ORDER — DICYCLOMINE HCL 20 MG PO TABS: 20.0000 mg | ORAL_TABLET | Freq: Three times a day (TID) | ORAL | 11 refills | Status: DC | PRN

## 2018-10-11 NOTE — Progress Notes (Signed)
Patient ID: COVA KNIERIEM, female   DOB: 1961/04/04, 58 y.o.   MRN: [id number] HPI: Melitta Tigue is a 58 year old female with a past medical history of adenomatous colon polyps, colonic diverticulosis, hemorrhoids, GERD, breast cancer, migraine headaches, hypertension who is seen in consultation at the request of Particia Nearing, PA-C to evaluate abdominal pain and recurrent diverticulitis.  She is here alone today.  She is known to me from a colonoscopy performed for surveillance on 11/03/2017.  This revealed 2 polyps in the cecum measuring 5 to 8 mm in size which were removed.  And a 5 mm sigmoid polyp also removed.  Multiple diverticula were found from the ascending to sigmoid colon.  Internal hemorrhoids were found on retroflexion.  The cecal polyps were found to be tubular adenomas while the sigmoid hyperplastic.  She reports that she has been struggling with left-sided abdominal pain as well as left flank pain.  This is been going on since January and has been significantly impacting her daily life.  She becomes emotional when discussing.  She has been treated with 3 rounds of antibiotics since January.  Initially in January she took ciprofloxacin and metronidazole for left-sided abdominal pain felt to be diverticulitis.  Symptoms resolved but recurred in fairly short order and she was treated with Levaquin in February.  She has just started as of 3 days ago another round of ciprofloxacin and metronidazole for left-sided abdominal pain.  This pain is dull and aching but at other times sharp and cramping.  She is been having a hard time sleeping.  Bowel movements have varied from constipation to loose stool.  She does have significant abdominal bloating.  Appetite has been affected and she has been eating a liquid diet over the last 3 to 4 days.  She is using a heating pad to her left abdomen which helps.  Overall symptoms have improved over the last 3 days though not resolved.  She has not taken her  temperature though she has had periods of feeling hot and cold.  No chills.  No nausea or vomiting.  She is takes omeprazole 20 mg a day for belching though she is not had much heartburn.  No dysphagia.  She has been eating a gluten-free diet over the last 3 weeks to see if this helps with some of her abdominal symptoms.  Past Medical History:  Diagnosis Date  . Allergy   . Anxiety   . Asthma   . Cancer ([place])    breast  . Depression   . Diverticulosis of sigmoid colon 07/14/2011  . GERD (gastroesophageal reflux disease)   . Hypertension   . Internal hemorrhoids   . Migraine   . PONV (postoperative nausea and vomiting)   . Tubular adenoma of colon     Past Surgical History:  Procedure Laterality Date  . ABDOMINAL HYSTERECTOMY  02/2010   partial  . BREAST REDUCTION SURGERY  02/2011  . BREAST SURGERY     cancer  . CHOLECYSTECTOMY  07/17/11   w/IOC  . COLONOSCOPY    . NASAL SINUS SURGERY  02/2011  . TONSILLECTOMY  age 61    Outpatient Medications Prior to Visit  Medication Sig Dispense Refill  . ALPRAZolam (XANAX) 1 MG tablet Take 1 tablet (1 mg total) by mouth 2 (two) times daily as needed for anxiety. 60 tablet 2  . buPROPion (WELLBUTRIN XL) 150 MG 24 hr tablet Take 3 tablets (450 mg total) by mouth daily. 90 tablet 11  . CALCIUM-VITAMIN  D PO Take 1 tablet by mouth. 600 mg of calcium    . ciprofloxacin (CIPRO) 500 MG tablet Take 1 tablet (500 mg total) by mouth 2 (two) times daily. 20 tablet 0  . Fluticasone-Salmeterol (ADVAIR DISKUS) 250-50 MCG/DOSE AEPB Inhale 1 puff into the lungs 2 (two) times daily. 1 each 5  . furosemide (LASIX) 20 MG tablet TAKE 1 TABLET ONCE DAILY. 30 tablet 3  . Lysine 500 MG CAPS Take 500 mg by mouth daily.    . metroNIDAZOLE (FLAGYL) 500 MG tablet Take 1 tablet (500 mg total) by mouth 3 (three) times daily. 30 tablet 0  . montelukast (SINGULAIR) 10 MG tablet TAKE 1 TABLET ONCE DAILY IN THE EVENING. 30 tablet 4  . olmesartan (BENICAR) 40 MG tablet  TAKE 1 TABLET DAILY IN THE MORNING. 30 tablet 3  . omeprazole (PRILOSEC) 20 MG capsule Take 1 capsule (20 mg total) by mouth daily. 30 capsule 3  . Probiotic Product (MEGA PROBIOTIC PO) Take 1 capsule by mouth daily.    . SUMAtriptan (IMITREX) 100 MG tablet TAKE 1 TABLET ONCE DAILY AS NEEDED FOR MIGRAINE AS DIRECTED BY PHYSICIAN. 30 tablet 11  . valACYclovir (VALTREX) 1000 MG tablet TAKE  (1)  TABLET  EVERY TWELVE HOURS. 20 tablet 0  . VENTOLIN HFA 108 (90 Base) MCG/ACT inhaler Inhale 2 puffs into the lungs every 6 (six) hours as needed for wheezing or shortness of breath. 1 Inhaler 11  . levofloxacin (LEVAQUIN) 500 MG tablet Take 1 tablet (500 mg total) by mouth daily. 10 tablet 1  . meclizine (ANTIVERT) 25 MG tablet Take 1 tablet (25 mg total) by mouth 3 (three) times daily as needed for dizziness. 60 tablet 0   No facility-administered medications prior to visit.     Allergies  Allergen Reactions  . Iohexol Anaphylaxis    IBD dye    Family History  Problem Relation Age of Onset  . Mental illness Mother   . Stroke Mother        died with this at age 23  . Heart disease Mother   . Hyperlipidemia Mother   . Alcohol abuse Father   . Cancer Paternal Grandmother        colon  . Colon cancer Paternal Grandmother   . Alcohol abuse Brother   . Asthma Brother   . Hypertension Brother   . Esophageal cancer Neg Hx   . Rectal cancer Neg Hx   . Stomach cancer Neg Hx     Social History   Tobacco Use  . Smoking status: Former Smoker    Packs/day: 0.25    Years: 2.00    Pack years: 0.50    Last attempt to quit: 07/28/2000    Years since quitting: 18.2  . Smokeless tobacco: Never Used  Substance Use Topics  . Alcohol use: No  . Drug use: No    ROS: As per history of present illness, otherwise negative  BP 140/90 (BP Location: Left Arm, Patient Position: Sitting, Cuff Size: Normal)   Pulse 80   Ht 5\' 4"  (1.626 m)   Wt 223 lb 4 oz (101.3 kg)   BMI 38.32 kg/m  Constitutional:  Well-developed and well-nourished. No distress. HEENT: Normocephalic and atraumatic. Conjunctivae are normal.  No scleral icterus. Neck: Neck supple. Trachea midline. Cardiovascular: Normal rate, regular rhythm and intact distal pulses. No M/R/G Pulmonary/chest: Effort normal and breath sounds normal. No wheezing, rales or rhonchi. Abdominal: Soft, left and lower abdominal pain without rebound or  guarding, nondistended. Bowel sounds active throughout. There are no masses palpable. No hepatosplenomegaly. Extremities: no clubbing, cyanosis, or edema Neurological: Alert and oriented to person place and time. Skin: Skin is warm and dry.  Psychiatric: Normal mood and affect. Behavior is normal.  RELEVANT LABS AND IMAGING: CBC    Component Value Date/Time   WBC 6.2 10/11/2018 1628   RBC 4.92 10/11/2018 1628   HGB 14.8 10/11/2018 1628   HGB 14.8 09/13/2018 0940   HGB 13.7 10/28/2010 1532   HCT 43.7 10/11/2018 1628   HCT 43.7 09/13/2018 0940   HCT 41.2 10/28/2010 1532   PLT 320.0 10/11/2018 1628   PLT 332 09/13/2018 0940   MCV 88.8 10/11/2018 1628   MCV 89 09/13/2018 0940   MCV 86.7 10/28/2010 1532   MCH 30.0 09/13/2018 0940   MCH 29.2 07/17/2011 1340   MCHC 34.0 10/11/2018 1628   RDW 14.3 10/11/2018 1628   RDW 13.8 09/13/2018 0940   RDW 14.6 (H) 10/28/2010 1532   LYMPHSABS 1.9 10/11/2018 1628   LYMPHSABS 2.2 09/13/2018 0940   LYMPHSABS 3.5 (H) 10/28/2010 1532   MONOABS 0.6 10/11/2018 1628   MONOABS 1.0 (H) 10/28/2010 1532   EOSABS 0.1 10/11/2018 1628   EOSABS 0.1 09/13/2018 0940   BASOSABS 0.0 10/11/2018 1628   BASOSABS 0.1 09/13/2018 0940   BASOSABS 0.1 10/28/2010 1532    CMP     Component Value Date/Time   NA 139 10/11/2018 1628   NA 141 09/13/2018 0940   K 3.6 10/11/2018 1628   CL 105 10/11/2018 1628   CO2 25 10/11/2018 1628   GLUCOSE 88 10/11/2018 1628   BUN 9 10/11/2018 1628   BUN 19 09/13/2018 0940   CREATININE 1.01 10/11/2018 1628   CALCIUM 9.0 10/11/2018  1628   PROT 6.8 10/11/2018 1628   PROT 6.4 09/13/2018 0940   ALBUMIN 4.1 10/11/2018 1628   ALBUMIN 4.2 09/13/2018 0940   AST 70 (H) 10/11/2018 1628   ALT 58 (H) 10/11/2018 1628   ALKPHOS 79 10/11/2018 1628   BILITOT 0.4 10/11/2018 1628   BILITOT 0.3 09/13/2018 0940   GFRNONAA 74 09/13/2018 0940   GFRAA 85 09/13/2018 0940    ASSESSMENT/PLAN: 58 year old female with a past medical history of adenomatous colon polyps, colonic diverticulosis, hemorrhoids, GERD, breast cancer, migraine headaches, hypertension who is seen in consultation at the request of Particia Nearing, PA-C to evaluate abdominal pain and recurrent diverticulitis.  1.  Left-sided abdominal pain/presumed diverticulitis --symptoms improved with antibiotics but have not resolved despite 2 completed rounds of antibiotics and she is now back on ciprofloxacin and metronidazole.  I recommended that we proceed with a CT scan of the abdomen pelvis with oral contrast (she has an IVP dye allergy which will make IV contrast not possible without a steroid prep).  Rule out diverticulitis and complication thereof.  Complete Cipro and Flagyl as prescribed for 10 days.  Check CBC, CMP.  Checking celiac panel though low suspicion overall.  Bentyl 20 mg 3 times daily PRN for crampy abdominal pain.  Continue low residue diet for now.  Further recommendations after reviewing cross-sectional imaging.  Could consider mesalamine if CT unremarkable for possible segmental colitis associated with diverticulosis.  Work note for the rest of this week  25 minutes spent with the patient today. Greater than 50% was spent in counseling and coordination of care with the patient  KG:YJEHU, Londell Moh, [place], [place] [address]

## 2018-10-11 NOTE — Patient Instructions (Signed)
Your provider has requested that you go to the basement level for lab work before leaving today. Press "B" on the elevator. The lab is located at the first door on the left as you exit the elevator.  We have sent the following medications to your pharmacy for you to pick up at your convenience: Bentyl 20 mg three times daily as needed  Continue Cipro and Flagyl until completed  You have been scheduled for a CT scan of the abdomen and pelvis at [facility] ([address]---this is in the same building as Press [facility]).   You are scheduled on 10/11/2018 at 3:15 pm. You should arrive 15 minutes prior to your appointment time for registration. Please follow the written instructions below on the day of your exam:  WARNING: IF YOU ARE ALLERGIC TO IODINE/X-RAY DYE, PLEASE NOTIFY RADIOLOGY IMMEDIATELY AT [phone]! YOU WILL BE GIVEN A 13 HOUR PREMEDICATION PREP.  1) Do not eat or drink anything after 11:15 am (4 hours prior to your test) 2) You have been given 2 bottles of oral contrast to drink. The solution may taste better if refrigerated, but do NOT add ice or any other liquid to this solution. Shake well before drinking.    Drink 1 bottle of contrast @ 1:15 pm (2 hours prior to your exam)  Drink 1 bottle of contrast @ 2:15 pm (1 hour prior to your exam)  You may take any medications as prescribed with a small amount of water, if necessary. If you take any of the following medications: METFORMIN, GLUCOPHAGE, GLUCOVANCE, AVANDAMET, RIOMET, FORTAMET, [place] MET, JANUMET, GLUMETZA or METAGLIP, you MAY be asked to HOLD this medication 48 hours AFTER the exam.  The purpose of you drinking the oral contrast is to aid in the visualization of your intestinal tract. The contrast solution may cause some diarrhea. Depending on your individual set of symptoms, you may also receive an intravenous injection of x-ray contrast/dye. Plan on being at [facility] for 30 minutes or  longer, depending on the type of exam you are having performed.  This test typically takes 30-45 minutes to complete.  If you have any questions regarding your exam or if you need to reschedule, you may call the CT department at [phone] between the hours of 8:00 am and 5:00 pm, Monday-Friday.  __________________________________________________________________  If you are age 45 or older, your body mass index should be between 23-30. Your Body mass index is 38.32 kg/m. If this is out of the aforementioned range listed, please consider follow up with your Primary Care Provider.  If you are age 86 or younger, your body mass index should be between 19-25. Your Body mass index is 38.32 kg/m. If this is out of the aformentioned range listed, please consider follow up with your Primary Care Provider.

## 2018-10-12 ENCOUNTER — Other Ambulatory Visit: Payer: Self-pay

## 2018-10-12 ENCOUNTER — Ambulatory Visit (INDEPENDENT_AMBULATORY_CARE_PROVIDER_SITE_OTHER)
Admission: RE | Admit: 2018-10-12 | Discharge: 2018-10-12 | Disposition: A | Discharge status: Home / Self Care | Payer: 59 | Source: Ambulatory Visit | Attending: Internal Medicine | Admitting: Internal Medicine

## 2018-10-12 DIAGNOSIS — R945 Abnormal results of liver function studies: Principal | ICD-10-CM

## 2018-10-12 DIAGNOSIS — K5732 Diverticulitis of large intestine without perforation or abscess without bleeding: Secondary | ICD-10-CM

## 2018-10-12 DIAGNOSIS — R109 Unspecified abdominal pain: Secondary | ICD-10-CM

## 2018-10-12 DIAGNOSIS — R7989 Other specified abnormal findings of blood chemistry: Secondary | ICD-10-CM

## 2018-10-12 LAB — TISSUE TRANSGLUTAMINASE, IGA: (TTG) AB, IGA: 1 U/mL

## 2018-10-13 ENCOUNTER — Other Ambulatory Visit: Payer: Self-pay

## 2018-10-13 MED ORDER — MESALAMINE 1.2 G PO TBEC: 2.4000 g | DELAYED_RELEASE_TABLET | Freq: Every day | ORAL | 3 refills | Status: DC

## 2018-10-15 ENCOUNTER — Other Ambulatory Visit: Payer: Self-pay | Admitting: *Deleted

## 2018-10-15 ENCOUNTER — Telehealth: Payer: Self-pay | Admitting: Internal Medicine

## 2018-10-15 DIAGNOSIS — J209 Acute bronchitis, unspecified: Secondary | ICD-10-CM

## 2018-10-15 DIAGNOSIS — J40 Bronchitis, not specified as acute or chronic: Secondary | ICD-10-CM

## 2018-10-15 MED ORDER — FLUTICASONE-SALMETEROL 250-50 MCG/DOSE IN AEPB: 1.0000 | INHALATION_SPRAY | Freq: Two times a day (BID) | RESPIRATORY_TRACT | 5 refills | Status: DC

## 2018-10-15 MED ORDER — VENTOLIN HFA 108 (90 BASE) MCG/ACT IN AERS: 2.0000 | INHALATION_SPRAY | Freq: Four times a day (QID) | RESPIRATORY_TRACT | 2 refills | Status: DC | PRN

## 2018-10-15 NOTE — Telephone Encounter (Signed)
Letter sent via mychart and mailed to pt. 

## 2018-10-20 ENCOUNTER — Other Ambulatory Visit: Payer: Self-pay

## 2018-10-20 ENCOUNTER — Telehealth (INDEPENDENT_AMBULATORY_CARE_PROVIDER_SITE_OTHER): Payer: 59 | Admitting: Physician Assistant

## 2018-10-20 DIAGNOSIS — Z91018 Allergy to other foods: Secondary | ICD-10-CM | POA: Diagnosis not present

## 2018-10-20 DIAGNOSIS — K5792 Diverticulitis of intestine, part unspecified, without perforation or abscess without bleeding: Secondary | ICD-10-CM | POA: Diagnosis not present

## 2018-10-20 MED ORDER — CIPROFLOXACIN HCL 500 MG PO TABS: 500.0000 mg | ORAL_TABLET | Freq: Two times a day (BID) | ORAL | 0 refills | Status: DC

## 2018-10-20 MED ORDER — METRONIDAZOLE 500 MG PO TABS: 500.0000 mg | ORAL_TABLET | Freq: Three times a day (TID) | ORAL | 0 refills | Status: DC

## 2018-10-20 NOTE — Progress Notes (Signed)
This telephone visit is concerning her chronic diverticulitis.  She saw Dr. Hilarie Fredrickson in March.  At that time they did add Lialda and Bentyl, as medications to help with her abdominal pain and cramping.  The CT was performed by him.  And she will be talking to him soon.  We have gone back and reviewed that she has developed some significant food allergies.  They are as follows:  Notes recorded by Terald Sleeper, PA-C on 09/15/2018 at 3:42 PM EST      Telephone visit  Subjective: CC: Diverticulitis PCP: Terald Sleeper, PA-C GYJ:EHUDJS AYRA HODGDON is a 58 y.o. female calls for telephone consult today. Patient provides verbal consent for consult held via phone.  Location of patient: Home Location of provider: [facility] Others present for call: Boyfriend is in the room  This telephone visit is concerning her chronic diverticulitis.  She saw Dr. Hilarie Fredrickson in March.  At that time they did add Lialda and Bentyl, as medications to help with her abdominal pain and cramping.  The CT was performed by him.  And she will be talking to him soon.  We have gone back and reviewed that she has developed some significant food allergies.  They are as follows:  Notes recorded by Terald Sleeper, PA-C on 09/15/2018 at 3:42 PM EST You have food allergies.  HIGH: peanut, wheat , sesame  MODERATE: soybean, corn  LOW: milk, walnut. [place]all[place]  At this time she does continue to have left-sided pain and changes in her bowel movements.  She was given instructions by the gastroenterologist to let them know if things were not improving.  I have encouraged her to give them a call.  They not set up any future procedures such as colonoscopy.  We will send in a medication for the treatment of the diverticulitis.  The patient had experience with Dr. Quentin Cornwall, general surgeon in [place].  She thinks is in the [facility].  She was greatly concerned about having to have a colon resection done if the diverticulitis  continues.  I have encouraged her to continue talking with the gastroenterologist and hopefully they will not get to this level of treatment. She is giving instructions that if she has any sudden bleeding severe abdominal pain that she cannot handle, fever or chills to go directly to the emergency room.    ROS: Per HPI  Allergies  Allergen Reactions   Iohexol Anaphylaxis    IBD dye   Past Medical History:  Diagnosis Date   Allergy    Anxiety    Asthma    Cancer ([place])    breast   Depression    Diverticulosis of sigmoid colon 07/14/2011   GERD (gastroesophageal reflux disease)    Hypertension    Internal hemorrhoids    Migraine    PONV (postoperative nausea and vomiting)    Tubular adenoma of colon     Current Outpatient Medications:    ALPRAZolam (XANAX) 1 MG tablet, Take 1 tablet (1 mg total) by mouth 2 (two) times daily as needed for anxiety., Disp: 60 tablet, Rfl: 2   buPROPion (WELLBUTRIN XL) 150 MG 24 hr tablet, Take 3 tablets (450 mg total) by mouth daily., Disp: 90 tablet, Rfl: 11   CALCIUM-VITAMIN D PO, Take 1 tablet by mouth. 600 mg of calcium, Disp: , Rfl:    ciprofloxacin (CIPRO) 500 MG tablet, Take 1 tablet (500 mg total) by mouth 2 (two) times daily., Disp: 20 tablet, Rfl: 0  dicyclomine (BENTYL) 20 MG tablet, Take 1 tablet (20 mg total) by mouth 3 (three) times daily as needed for spasms., Disp: 90 tablet, Rfl: 11   Fluticasone-Salmeterol (ADVAIR DISKUS) 250-50 MCG/DOSE AEPB, Inhale 1 puff into the lungs 2 (two) times daily., Disp: 1 each, Rfl: 5   furosemide (LASIX) 20 MG tablet, TAKE 1 TABLET ONCE DAILY., Disp: 30 tablet, Rfl: 3   Lysine 500 MG CAPS, Take 500 mg by mouth daily., Disp: , Rfl:    mesalamine (LIALDA) 1.2 g EC tablet, Take 2 tablets (2.4 g total) by mouth daily with breakfast., Disp: 60 tablet, Rfl: 3   metroNIDAZOLE (FLAGYL) 500 MG tablet, Take 1 tablet (500 mg total) by mouth 3 (three) times daily., Disp: 30 tablet, Rfl:  0   montelukast (SINGULAIR) 10 MG tablet, TAKE 1 TABLET ONCE DAILY IN THE EVENING., Disp: 30 tablet, Rfl: 4   olmesartan (BENICAR) 40 MG tablet, TAKE 1 TABLET DAILY IN THE MORNING., Disp: 30 tablet, Rfl: 3   omeprazole (PRILOSEC) 20 MG capsule, Take 1 capsule (20 mg total) by mouth daily., Disp: 30 capsule, Rfl: 3   Probiotic Product (MEGA PROBIOTIC PO), Take 1 capsule by mouth daily., Disp: , Rfl:    SUMAtriptan (IMITREX) 100 MG tablet, TAKE 1 TABLET ONCE DAILY AS NEEDED FOR MIGRAINE AS DIRECTED BY PHYSICIAN., Disp: 30 tablet, Rfl: 11   valACYclovir (VALTREX) 1000 MG tablet, TAKE  (1)  TABLET  EVERY TWELVE HOURS., Disp: 20 tablet, Rfl: 0   VENTOLIN HFA 108 (90 Base) MCG/ACT inhaler, Inhale 2 puffs into the lungs every 6 (six) hours as needed for wheezing or shortness of breath., Disp: 1 Inhaler, Rfl: 2  Assessment/ Plan: 58 y.o. female   1. Diverticulitis - ciprofloxacin (CIPRO) 500 MG tablet; Take 1 tablet (500 mg total) by mouth 2 (two) times daily.  Dispense: 20 tablet; Refill: 0 - metroNIDAZOLE (FLAGYL) 500 MG tablet; Take 1 tablet (500 mg total) by mouth 3 (three) times daily.  Dispense: 30 tablet; Refill: 0  2. Food allergy Avoidance measures   Start time: 2:01 pm End time: 2:23 pm  Meds ordered this encounter  Medications   ciprofloxacin (CIPRO) 500 MG tablet    Sig: Take 1 tablet (500 mg total) by mouth 2 (two) times daily.    Dispense:  20 tablet    Refill:  0    Order Specific Question:   Supervising Provider    Answer:   Janora Norlander [[id number]]   metroNIDAZOLE (FLAGYL) 500 MG tablet    Sig: Take 1 tablet (500 mg total) by mouth 3 (three) times daily.    Dispense:  30 tablet    Refill:  0    Order Specific Question:   Supervising Provider    Answer:   Janora Norlander [[id number]]    Particia Nearing PA-C [facility] [phone]

## 2018-10-21 DIAGNOSIS — Z91018 Allergy to other foods: Secondary | ICD-10-CM | POA: Insufficient documentation

## 2018-11-03 ENCOUNTER — Other Ambulatory Visit: Payer: Self-pay | Admitting: Physician Assistant

## 2018-11-18 ENCOUNTER — Telehealth: Payer: Self-pay | Admitting: Internal Medicine

## 2018-11-18 NOTE — Telephone Encounter (Signed)
See note from Sherlynn Stalls below and advise.

## 2018-11-18 NOTE — Telephone Encounter (Signed)
Called patient and gave Dr. Vena Rua recommendations.Faxed patient's information for consult with [facility]. Virtual [facility] visit scheduled

## 2018-11-18 NOTE — Telephone Encounter (Signed)
Patient called with c/o sharp pain in LLQ last night that lasted 1-2 hrs. Before It let up at all. No fever and no real change in her chronic diarrhea. Heating pad gave some relief. She has not been taking her Prilosec daily but she took that and Pepcid yesterday and this am. Wilburn Mylar was the first time she eat anything besides carbs. Her PCP gave her another course of Cipro and Flagly 1 week ago because she felt like her diverticulitis was flaring up. She has 3 days left. Please advise

## 2018-11-18 NOTE — Telephone Encounter (Signed)
I would recommend that she be seen by general surgery, to discuss ongoing issues with recurrent diverticulitis and symptomatic diverticular disease.  She has been treated with multiple antibiotics over the last 3 months and is not improving. She should continue current antibiotics until completion Can use Bentyl as previously recommended Low residue diet and if in pain then clear liquids CT scan performed recently showed severe sigmoid diverticulosis with thickening  Virtual visit in the next week or 2, if possible  Once she completes antibiotics, okay if she tries Lialda 2.4 g daily for possible inflammation associated with her diverticulosis.  Please let her know that this in rare cases can cause worsening diarrhea, and should this occur she should let me know immediately

## 2018-11-18 NOTE — Telephone Encounter (Signed)
Pt states that she been experiencing left lower abd pain and yesterday she had to sleep on a recliner because pain was very bad, she would like some advise.

## 2018-11-23 ENCOUNTER — Telehealth: Payer: Self-pay | Admitting: Internal Medicine

## 2018-11-23 ENCOUNTER — Other Ambulatory Visit: Payer: Self-pay | Admitting: *Deleted

## 2018-11-23 NOTE — Progress Notes (Signed)
Error

## 2018-11-23 NOTE — Telephone Encounter (Signed)
Spoke with patient who reports when she drank Redicat contrast and within 15 minutes, she developed diarrhea, no N/V, and "couldn't swallow" when attempting to drink the second bottle. The patient thought she may be having an allergic reaction. The patient denies hives or rashes, tongue swelling, shortness of breath, or wheezing.

## 2018-11-23 NOTE — Telephone Encounter (Signed)
Pt would like to know what contrast was used for her CT scan last month--pt stated that she had an allergic reaction to the contrast.

## 2018-11-29 ENCOUNTER — Other Ambulatory Visit: Payer: Self-pay

## 2018-11-29 ENCOUNTER — Ambulatory Visit (INDEPENDENT_AMBULATORY_CARE_PROVIDER_SITE_OTHER): Payer: 59 | Admitting: Family Medicine

## 2018-11-29 ENCOUNTER — Telehealth: Payer: Self-pay | Admitting: Physician Assistant

## 2018-11-29 VITALS — BP 128/87 | HR 89 | Temp 97.9°F | Ht 64.0 in | Wt 229.0 lb

## 2018-11-29 DIAGNOSIS — L02416 Cutaneous abscess of left lower limb: Secondary | ICD-10-CM | POA: Diagnosis not present

## 2018-11-29 DIAGNOSIS — L03116 Cellulitis of left lower limb: Secondary | ICD-10-CM

## 2018-11-29 DIAGNOSIS — Z23 Encounter for immunization: Secondary | ICD-10-CM

## 2018-11-29 DIAGNOSIS — S81832A Puncture wound without foreign body, left lower leg, initial encounter: Secondary | ICD-10-CM

## 2018-11-29 MED ORDER — CIPROFLOXACIN HCL 500 MG PO TABS: 500.0000 mg | ORAL_TABLET | Freq: Two times a day (BID) | ORAL | 0 refills | Status: DC

## 2018-11-29 MED ORDER — CLOTRIMAZOLE 10 MG MT TROC: OROMUCOSAL | 2 refills | Status: DC

## 2018-11-29 NOTE — Telephone Encounter (Signed)
No tetanus on file.  appt made to be seen

## 2018-11-29 NOTE — Progress Notes (Signed)
Chief Complaint  Patient presents with  . Puncture wound to right lower leg    HPI  Patient presents today for puncture caused by tomato cage wire. She mishandled it and wound resulted. Occurred yesterday. Painful and red today. Needs TDaP.  PMH: Smoking status noted ROS: Per HPI  Objective: BP 128/87   Pulse 89   Temp 97.9 F (36.6 C) (Oral)   Ht 5\' 4"  (1.626 m)   Wt 229 lb (103.9 kg)   BMI 39.31 kg/m  Gen: NAD, alert, cooperative with exam HEENT: NCAT, EOMI, PERRL  Neuro: Alert and oriented, No gross deficits. 4 mm nidus with crust lateral aspect of the left shin.  There is mild erythema covering most of the anterior and lateral aspect of the lower leg to the ankle.  There is 2+ edema.  Puncture wound  Assessment and plan:  1. Puncture wound of left lower leg, initial encounter   2. Cellulitis and abscess of left leg     Meds ordered this encounter  Medications  . clotrimazole (MYCELEX) 10 MG troche    Sig: Allow one to dissolve in the mouth 5 times daily For yeast    Dispense:  35 tablet    Refill:  2  . ciprofloxacin (CIPRO) 500 MG tablet    Sig: Take 1 tablet (500 mg total) by mouth 2 (two) times daily.    Dispense:  20 tablet    Refill:  0    Orders Placed This Encounter  Procedures  . Tdap vaccine greater than or equal to 7yo IM   Wound care reviewed.  Should the redness and swelling not receded over the next 2 to 3 days or if any point it seems to be worsening she should follow-up immediately.  Claretta Fraise, MD

## 2018-11-30 ENCOUNTER — Ambulatory Visit: Payer: 59 | Admitting: Internal Medicine

## 2018-11-30 ENCOUNTER — Encounter: Payer: Self-pay | Admitting: Family Medicine

## 2018-12-04 ENCOUNTER — Other Ambulatory Visit: Payer: Self-pay | Admitting: Physician Assistant

## 2018-12-16 ENCOUNTER — Other Ambulatory Visit: Payer: Self-pay

## 2018-12-16 ENCOUNTER — Other Ambulatory Visit: Payer: Self-pay | Admitting: Physician Assistant

## 2018-12-16 DIAGNOSIS — J40 Bronchitis, not specified as acute or chronic: Secondary | ICD-10-CM

## 2018-12-17 ENCOUNTER — Ambulatory Visit (INDEPENDENT_AMBULATORY_CARE_PROVIDER_SITE_OTHER): Payer: 59 | Admitting: Family Medicine

## 2018-12-17 ENCOUNTER — Encounter: Payer: Self-pay | Admitting: Family Medicine

## 2018-12-17 VITALS — BP 140/87 | HR 80 | Temp 99.1°F | Ht 64.0 in | Wt 230.0 lb

## 2018-12-17 DIAGNOSIS — Z8742 Personal history of other diseases of the female genital tract: Secondary | ICD-10-CM | POA: Diagnosis not present

## 2018-12-17 DIAGNOSIS — L03115 Cellulitis of right lower limb: Secondary | ICD-10-CM | POA: Diagnosis not present

## 2018-12-17 MED ORDER — FLUCONAZOLE 150 MG PO TABS: 150.0000 mg | ORAL_TABLET | Freq: Once | ORAL | 0 refills | Status: AC

## 2018-12-17 MED ORDER — DOXYCYCLINE HYCLATE 100 MG PO TABS: 100.0000 mg | ORAL_TABLET | Freq: Two times a day (BID) | ORAL | 0 refills | Status: AC

## 2018-12-17 NOTE — Progress Notes (Signed)
Subjective:  Patient ID: Deanna [name], female    DOB: 04-Sep-1960, 58 y.o.   MRN: [id number]  Chief Complaint:  Cellulitis   HPI: Deanna [name] is a 58 y.o. female presenting on 12/17/2018 for Cellulitis  Pt presents today for redness and swelling to her right lower leg. Pt was seen and treated for cellulitis on 11/29/2018. She states that area has completely healed. States this new area came up on Wednesday. States she did have a low grade fever on Wednesday. She denies new injury. No weakness, confusion, chills, or fatigue. States she has been elevating the leg and putting cool compressed on it without relief of symptoms.    Relevant past medical, surgical, family, and social history reviewed and updated as indicated.  Allergies and medications reviewed and updated.   Past Medical History:  Diagnosis Date  . Allergy   . Anxiety   . Asthma   . Cancer ([place])    breast  . Depression   . Diverticulosis of sigmoid colon 07/14/2011  . GERD (gastroesophageal reflux disease)   . Hypertension   . Internal hemorrhoids   . Migraine   . PONV (postoperative nausea and vomiting)   . Tubular adenoma of colon     Past Surgical History:  Procedure Laterality Date  . ABDOMINAL HYSTERECTOMY  02/2010   partial  . BREAST REDUCTION SURGERY  02/2011  . BREAST SURGERY     cancer  . CHOLECYSTECTOMY  07/17/11   w/IOC  . COLONOSCOPY    . NASAL SINUS SURGERY  02/2011  . TONSILLECTOMY  age 82    Social History   Socioeconomic History  . Marital status: Divorced    Spouse name: Not on file  . Number of children: 1  . Years of education: Not on file  . Highest education level: Not on file  Occupational History  . Occupation: Scientist, physiological with [employer]  . Financial resource strain: Not on file  . Food insecurity:    Worry: Not on file    Inability: Not on file  . Transportation needs:    Medical: Not on file    Non-medical: Not on file  Tobacco Use  . Smoking  status: Former Smoker    Packs/day: 0.25    Years: 2.00    Pack years: 0.50    Last attempt to quit: 07/28/2000    Years since quitting: 18.4  . Smokeless tobacco: Never Used  Substance and Sexual Activity  . Alcohol use: No  . Drug use: No  . Sexual activity: Not on file  Lifestyle  . Physical activity:    Days per week: Not on file    Minutes per session: Not on file  . Stress: Not on file  Relationships  . Social connections:    Talks on phone: Not on file    Gets together: Not on file    Attends religious service: Not on file    Active member of club or organization: Not on file    Attends meetings of clubs or organizations: Not on file    Relationship status: Not on file  . Intimate partner violence:    Fear of current or ex partner: Not on file    Emotionally abused: Not on file    Physically abused: Not on file    Forced sexual activity: Not on file  Other Topics Concern  . Not on file  Social History Narrative  . Not on file  Outpatient Encounter Medications as of 12/17/2018  Medication Sig  . ALPRAZolam (XANAX) 1 MG tablet Take 1 tablet (1 mg total) by mouth 2 (two) times daily as needed for anxiety.  [name] buPROPion (WELLBUTRIN XL) 150 MG 24 hr tablet TAKE 3 TABLETS ONCE DAILY.  [name] CALCIUM-VITAMIN D PO Take 1 tablet by mouth. 600 mg of calcium  . clotrimazole (MYCELEX) 10 MG troche Allow one to dissolve in the mouth 5 times daily For yeast  . dicyclomine (BENTYL) 20 MG tablet Take 1 tablet (20 mg total) by mouth 3 (three) times daily as needed for spasms.  . Fluticasone-Salmeterol (ADVAIR DISKUS) 250-50 MCG/DOSE AEPB Inhale 1 puff into the lungs 2 (two) times daily.  . furosemide (LASIX) 20 MG tablet TAKE 1 TABLET ONCE DAILY.  [name] Lysine 500 MG CAPS Take 500 mg by mouth daily.  . mesalamine (LIALDA) 1.2 g EC tablet Take 2 tablets (2.4 g total) by mouth daily with breakfast.  . montelukast (SINGULAIR) 10 MG tablet TAKE 1 TABLET ONCE DAILY IN THE EVENING.  [name] olmesartan  (BENICAR) 40 MG tablet TAKE 1 TABLET DAILY IN THE MORNING.  [name] omeprazole (PRILOSEC) 20 MG capsule Take 1 capsule (20 mg total) by mouth daily.  . Probiotic Product (MEGA PROBIOTIC PO) Take 1 capsule by mouth daily.  . SUMAtriptan (IMITREX) 100 MG tablet TAKE 1 TABLET ONCE DAILY AS NEEDED FOR MIGRAINE AS DIRECTED BY PHYSICIAN.  . valACYclovir (VALTREX) 1000 MG tablet TAKE  (1)  TABLET  EVERY TWELVE HOURS.  . VENTOLIN HFA 108 (90 Base) MCG/ACT inhaler Inhale 2 puffs into the lungs every 6 (six) hours as needed for wheezing or shortness of breath.  . doxycycline (VIBRA-TABS) 100 MG tablet Take 1 tablet (100 mg total) by mouth 2 (two) times daily for 10 days. 1 po bid  . fluconazole (DIFLUCAN) 150 MG tablet Take 1 tablet (150 mg total) by mouth once for 1 dose.  . [DISCONTINUED] ciprofloxacin (CIPRO) 500 MG tablet Take 1 tablet (500 mg total) by mouth 2 (two) times daily.   No facility-administered encounter medications on file as of 12/17/2018.     Allergies  Allergen Reactions  . Iohexol Anaphylaxis    IBD dye    Review of Systems  Constitutional: Positive for fever. Negative for chills and fatigue.  Respiratory: Negative for cough, chest tightness and shortness of breath.   Cardiovascular: Negative for chest pain, palpitations and leg swelling.  Musculoskeletal: Negative for arthralgias, joint swelling and myalgias.  Skin: Positive for color change and wound.  Neurological: Negative for dizziness, weakness and headaches.  Psychiatric/Behavioral: Negative for confusion.  All other systems reviewed and are negative.       Objective:  BP 140/87   Pulse 80   Temp 99.1 F (37.3 C) (Oral)   Ht 5\' 4"  (1.626 m)   Wt 230 lb (104.3 kg)   BMI 39.48 kg/m    Wt Readings from Last 3 Encounters:  12/17/18 230 lb (104.3 kg)  11/29/18 229 lb (103.9 kg)  10/11/18 223 lb 4 oz (101.3 kg)    Physical Exam Vitals signs and nursing note reviewed.  Constitutional:      General: She is not in  acute distress.    Appearance: Normal appearance. She is well-developed and well-groomed. She is not ill-appearing or toxic-appearing.  HENT:     Head: Normocephalic and atraumatic.     Mouth/Throat:     Mouth: Mucous membranes are moist.  Eyes:     Conjunctiva/sclera: Conjunctivae normal.  Pupils: Pupils are equal, round, and reactive to light.  Cardiovascular:     Rate and Rhythm: Normal rate and regular rhythm.     Pulses: Normal pulses.     Heart sounds: Normal heart sounds. No murmur. No friction rub. No gallop.   Pulmonary:     Effort: Pulmonary effort is normal. No respiratory distress.     Breath sounds: Normal breath sounds.  Skin:    General: Skin is warm and dry.     Capillary Refill: Capillary refill takes less than 2 seconds.       Neurological:     General: No focal deficit present.     Mental Status: She is alert and oriented to person, place, and time.  Psychiatric:        Mood and Affect: Mood normal.        Behavior: Behavior normal. Behavior is cooperative.        Thought Content: Thought content normal.        Judgment: Judgment normal.       Results for orders placed or performed in visit on 10/11/18  IgA  Result Value Ref Range   IgA 206 68 - 378 mg/dL  Tissue transglutaminase, IgA  Result Value Ref Range   (tTG) Ab, IgA 1 U/mL  Comprehensive metabolic panel  Result Value Ref Range   Sodium 139 135 - 145 mEq/L   Potassium 3.6 3.5 - 5.1 mEq/L   Chloride 105 96 - 112 mEq/L   CO2 25 19 - 32 mEq/L   Glucose, Bld 88 70 - 99 mg/dL   BUN 9 6 - 23 mg/dL   Creatinine, Ser 1.01 0.40 - 1.20 mg/dL   Total Bilirubin 0.4 0.2 - 1.2 mg/dL   Alkaline Phosphatase 79 39 - 117 U/L   AST 70 (H) 0 - 37 U/L   ALT 58 (H) 0 - 35 U/L   Total Protein 6.8 6.0 - 8.3 g/dL   Albumin 4.1 3.5 - 5.2 g/dL   Calcium 9.0 8.4 - 10.5 mg/dL   GFR 56.48 (L) >60.00 mL/min  CBC with Differential/Platelet  Result Value Ref Range   WBC 6.2 4.0 - 10.5 K/uL   RBC 4.92 3.87 -  5.11 Mil/uL   Hemoglobin 14.8 12.0 - 15.0 g/dL   HCT 43.7 36.0 - 46.0 %   MCV 88.8 78.0 - 100.0 fl   MCHC 34.0 30.0 - 36.0 g/dL   RDW 14.3 11.5 - 15.5 %   Platelets 320.0 150.0 - 400.0 K/uL   Neutrophils Relative % 55.8 43.0 - 77.0 %   Lymphocytes Relative 31.0 12.0 - 46.0 %   Monocytes Relative 10.2 3.0 - 12.0 %   Eosinophils Relative 2.2 0.0 - 5.0 %   Basophils Relative 0.8 0.0 - 3.0 %   Neutro Abs 3.5 1.4 - 7.7 K/uL   Lymphs Abs 1.9 0.7 - 4.0 K/uL   Monocytes Absolute 0.6 0.1 - 1.0 K/uL   Eosinophils Absolute 0.1 0.0 - 0.7 K/uL   Basophils Absolute 0.0 0.0 - 0.1 K/uL       Pertinent labs & imaging results that were available during my care of the patient were reviewed by me and considered in my medical decision making.  Assessment & Plan:  Diagnoses and all orders for this visit:  Cellulitis of right lower extremity First site of cellulitis completely healed. New site with increased warmth, erythema, and swelling. No FB noted. Will treat with below. Follow up in 2 weeks or sooner if  symptoms worsen.  -     doxycycline (VIBRA-TABS) 100 MG tablet; Take 1 tablet (100 mg total) by mouth 2 (two) times daily for 10 days. 1 po bid  History of vulvovaginitis Prone to yeast infections with antibiotic use. Pt aware to take if symptoms develop.  -     fluconazole (DIFLUCAN) 150 MG tablet; Take 1 tablet (150 mg total) by mouth once for 1 dose.     Continue all other maintenance medications.  Follow up plan: Return in about 2 weeks (around 12/31/2018), or if symptoms worsen or fail to improve, for cellulitis.  Educational handout given for cellulitis   The above assessment and management plan was discussed with the patient. The patient verbalized understanding of and has agreed to the management plan. Patient is aware to call the clinic if symptoms persist or worsen. Patient is aware when to return to the clinic for a follow-up visit. Patient educated on when it is appropriate to go to  the emergency department.   Monia Pouch, FNP-C [facility] [phone]

## 2018-12-17 NOTE — Patient Instructions (Signed)

## 2018-12-24 ENCOUNTER — Other Ambulatory Visit: Payer: Self-pay

## 2018-12-24 ENCOUNTER — Ambulatory Visit: Payer: 59 | Admitting: Physician Assistant

## 2018-12-24 ENCOUNTER — Encounter: Payer: Self-pay | Admitting: Physician Assistant

## 2018-12-24 VITALS — BP 130/87 | HR 85 | Temp 97.3°F | Ht 64.0 in | Wt 230.0 lb

## 2018-12-24 DIAGNOSIS — L03115 Cellulitis of right lower limb: Secondary | ICD-10-CM

## 2018-12-24 MED ORDER — FLUCONAZOLE 150 MG PO TABS: ORAL_TABLET | ORAL | 0 refills | Status: DC

## 2018-12-24 MED ORDER — CLINDAMYCIN HCL 300 MG PO CAPS: 300.0000 mg | ORAL_CAPSULE | Freq: Three times a day (TID) | ORAL | 0 refills | Status: DC

## 2018-12-24 NOTE — Progress Notes (Signed)
BP 130/87   Pulse 85   Temp (!) 97.3 F (36.3 C) (Oral)   Ht 5\' 4"  (1.626 m)   Wt 230 lb (104.3 kg)   BMI 39.48 kg/m    Subjective:    Patient ID: Deanna [name], female    DOB: 03-28-1961, 58 y.o.   MRN: [id number]  HPI: Deanna [name] is a 58 y.o. female presenting on 12/24/2018 for Wound Check (right leg)  This patient comes in for recheck on wound the wound on her right lower leg.  She scraped it on some wire in her yard and working in her garden.  It poked through the front and the medial side had just a little bit of a scrape.  The anterior area has healed very nicely.  But now the medial portion has continued to have swelling.  She is tried several antibiotics and is currently on doxycycline.  And it has just gotten worse.  She denies any fever or chills.  She is not having any pain going up the leg.  She had taken Levaquin frequently for severe colon infections during the winter and she feels like possibly she is not sensitive to quinolones anymore.  We are going to try some clindamycin as an option for the infection.  Inserted our the previous picture of the leg and today's picture        Past Medical History:  Diagnosis Date  . Allergy   . Anxiety   . Asthma   . Cancer ([place])    breast  . Depression   . Diverticulosis of sigmoid colon 07/14/2011  . GERD (gastroesophageal reflux disease)   . Hypertension   . Internal hemorrhoids   . Migraine   . PONV (postoperative nausea and vomiting)   . Tubular adenoma of colon    Relevant past medical, surgical, family and social history reviewed and updated as indicated. Interim medical history since our last visit reviewed. Allergies and medications reviewed and updated. DATA REVIEWED: CHART IN EPIC  Family History reviewed for pertinent findings.  Review of Systems  Constitutional: Negative.  Negative for activity change, fatigue and fever.  HENT: Negative.   Eyes: Negative.   Respiratory: Negative.  Negative for  cough.   Cardiovascular: Negative.  Negative for chest pain.  Gastrointestinal: Negative.  Negative for abdominal pain.  Endocrine: Negative.   Genitourinary: Negative.  Negative for dysuria.  Musculoskeletal: Negative.   Skin: Positive for color change, rash and wound.  Neurological: Negative.     Allergies as of 12/24/2018      Reactions   Iohexol Anaphylaxis   IBD dye      Medication List       Accurate as of Dec 24, 2018 11:59 PM. If you have any questions, ask your nurse or doctor.        ALPRAZolam 1 MG tablet Commonly known as:  XANAX Take 1 tablet (1 mg total) by mouth 2 (two) times daily as needed for anxiety.   buPROPion 150 MG 24 hr tablet Commonly known as:  WELLBUTRIN XL TAKE 3 TABLETS ONCE DAILY.   CALCIUM-VITAMIN D PO Take 1 tablet by mouth. 600 mg of calcium   clindamycin 300 MG capsule Commonly known as:  Cleocin Take 1 capsule (300 mg total) by mouth 3 (three) times daily. Started by:  Terald Sleeper, PA-C   clotrimazole 10 MG troche Commonly known as:  MYCELEX Allow one to dissolve in the mouth 5 times daily For yeast  dicyclomine 20 MG tablet Commonly known as:  Bentyl Take 1 tablet (20 mg total) by mouth 3 (three) times daily as needed for spasms.   doxycycline 100 MG tablet Commonly known as:  VIBRA-TABS Take 1 tablet (100 mg total) by mouth 2 (two) times daily for 10 days. 1 po bid   fluconazole 150 MG tablet Commonly known as:  Diflucan 1 po q week x 4 weeks Started by:  Terald Sleeper, PA-C   Fluticasone-Salmeterol 250-50 MCG/DOSE Aepb Commonly known as:  Advair Diskus Inhale 1 puff into the lungs 2 (two) times daily.   furosemide 20 MG tablet Commonly known as:  LASIX TAKE 1 TABLET ONCE DAILY.   Lysine 500 MG Caps Take 500 mg by mouth daily.   MEGA PROBIOTIC PO Take 1 capsule by mouth daily.   mesalamine 1.2 g EC tablet Commonly known as:  LIALDA Take 2 tablets (2.4 g total) by mouth daily with breakfast.   montelukast  10 MG tablet Commonly known as:  SINGULAIR TAKE 1 TABLET ONCE DAILY IN THE EVENING.   olmesartan 40 MG tablet Commonly known as:  BENICAR TAKE 1 TABLET DAILY IN THE MORNING.   omeprazole 20 MG capsule Commonly known as:  PRILOSEC Take 1 capsule (20 mg total) by mouth daily.   SUMAtriptan 100 MG tablet Commonly known as:  IMITREX TAKE 1 TABLET ONCE DAILY AS NEEDED FOR MIGRAINE AS DIRECTED BY PHYSICIAN.   valACYclovir 1000 MG tablet Commonly known as:  VALTREX TAKE  (1)  TABLET  EVERY TWELVE HOURS.   Ventolin HFA 108 (90 Base) MCG/ACT inhaler Generic drug:  albuterol Inhale 2 puffs into the lungs every 6 (six) hours as needed for wheezing or shortness of breath.          Objective:    BP 130/87   Pulse 85   Temp (!) 97.3 F (36.3 C) (Oral)   Ht 5\' 4"  (1.626 m)   Wt 230 lb (104.3 kg)   BMI 39.48 kg/m   Allergies  Allergen Reactions  . Iohexol Anaphylaxis    IBD dye    Wt Readings from Last 3 Encounters:  12/24/18 230 lb (104.3 kg)  12/17/18 230 lb (104.3 kg)  11/29/18 229 lb (103.9 kg)    Physical Exam Constitutional:      Appearance: She is well-developed.  HENT:     Head: Normocephalic and atraumatic.  Eyes:     Conjunctiva/sclera: Conjunctivae normal.     Pupils: Pupils are equal, round, and reactive to light.  Cardiovascular:     Rate and Rhythm: Normal rate and regular rhythm.     Heart sounds: Normal heart sounds.  Pulmonary:     Effort: Pulmonary effort is normal.     Breath sounds: Normal breath sounds.  Skin:    General: Skin is warm and dry.     Findings: Erythema and lesion present. No rash.          Comments: See inserted pictures above   Neurological:     Mental Status: She is alert and oriented to person, place, and time.     Deep Tendon Reflexes: Reflexes are normal and symmetric.         Assessment & Plan:   1. Cellulitis of right lower extremity - clindamycin (CLEOCIN) 300 MG capsule; Take 1 capsule (300 mg total) by mouth  3 (three) times daily.  Dispense: 30 capsule; Refill: 0 - fluconazole (DIFLUCAN) 150 MG tablet; 1 po q week x 4 weeks  Dispense: 4 tablet; Refill: 0   Continue all other maintenance medications as listed above.  Follow up plan: No follow-ups on file. Please send a picture next week through my chart app  Educational handout given for cellulitis  Terald Sleeper PA-C [facility] [address]  [place], [place] [address] [phone]   12/27/2018, 12:24 PM

## 2018-12-27 ENCOUNTER — Encounter: Payer: Self-pay | Admitting: Physician Assistant

## 2018-12-30 ENCOUNTER — Ambulatory Visit: Payer: 59 | Admitting: Family Medicine

## 2019-01-03 ENCOUNTER — Other Ambulatory Visit: Payer: Self-pay | Admitting: Physician Assistant

## 2019-02-09 ENCOUNTER — Other Ambulatory Visit: Payer: Self-pay | Admitting: Physician Assistant

## 2019-02-09 DIAGNOSIS — F411 Generalized anxiety disorder: Secondary | ICD-10-CM

## 2019-02-10 ENCOUNTER — Other Ambulatory Visit: Payer: Self-pay | Admitting: *Deleted

## 2019-02-10 DIAGNOSIS — F411 Generalized anxiety disorder: Secondary | ICD-10-CM

## 2019-02-15 ENCOUNTER — Ambulatory Visit: Payer: 59 | Admitting: Physician Assistant

## 2019-03-15 ENCOUNTER — Other Ambulatory Visit: Payer: Self-pay | Admitting: Physician Assistant

## 2019-03-18 ENCOUNTER — Ambulatory Visit (INDEPENDENT_AMBULATORY_CARE_PROVIDER_SITE_OTHER): Payer: 59 | Admitting: Physician Assistant

## 2019-03-18 ENCOUNTER — Encounter: Payer: Self-pay | Admitting: Physician Assistant

## 2019-03-18 VITALS — BP 140/100

## 2019-03-18 DIAGNOSIS — J209 Acute bronchitis, unspecified: Secondary | ICD-10-CM

## 2019-03-18 DIAGNOSIS — J40 Bronchitis, not specified as acute or chronic: Secondary | ICD-10-CM | POA: Diagnosis not present

## 2019-03-18 DIAGNOSIS — F411 Generalized anxiety disorder: Secondary | ICD-10-CM | POA: Diagnosis not present

## 2019-03-18 DIAGNOSIS — F988 Other specified behavioral and emotional disorders with onset usually occurring in childhood and adolescence: Secondary | ICD-10-CM

## 2019-03-18 DIAGNOSIS — R109 Unspecified abdominal pain: Secondary | ICD-10-CM

## 2019-03-18 DIAGNOSIS — G8929 Other chronic pain: Secondary | ICD-10-CM

## 2019-03-18 MED ORDER — VALACYCLOVIR HCL 1 G PO TABS: ORAL_TABLET | ORAL | 5 refills | Status: AC

## 2019-03-18 MED ORDER — FLUTICASONE-SALMETEROL 250-50 MCG/DOSE IN AEPB: 1.0000 | INHALATION_SPRAY | Freq: Two times a day (BID) | RESPIRATORY_TRACT | 5 refills | Status: DC

## 2019-03-18 MED ORDER — SUMATRIPTAN SUCCINATE 100 MG PO TABS: ORAL_TABLET | ORAL | 11 refills | Status: DC

## 2019-03-18 MED ORDER — GUANFACINE HCL ER 1 MG PO TB24: 1.0000 mg | ORAL_TABLET | Freq: Every day | ORAL | 1 refills | Status: DC

## 2019-03-18 MED ORDER — VENTOLIN HFA 108 (90 BASE) MCG/ACT IN AERS: 2.0000 | INHALATION_SPRAY | Freq: Four times a day (QID) | RESPIRATORY_TRACT | 11 refills | Status: DC | PRN

## 2019-03-18 MED ORDER — BUPROPION HCL ER (XL) 150 MG PO TB24: 450.0000 mg | ORAL_TABLET | Freq: Every day | ORAL | 11 refills | Status: DC

## 2019-03-18 MED ORDER — OLMESARTAN MEDOXOMIL 40 MG PO TABS: 40.0000 mg | ORAL_TABLET | Freq: Every morning | ORAL | 11 refills | Status: DC

## 2019-03-18 MED ORDER — OMEPRAZOLE 20 MG PO CPDR: 20.0000 mg | DELAYED_RELEASE_CAPSULE | Freq: Every day | ORAL | 11 refills | Status: DC

## 2019-03-18 MED ORDER — FUROSEMIDE 20 MG PO TABS: 20.0000 mg | ORAL_TABLET | Freq: Every day | ORAL | 11 refills | Status: DC

## 2019-03-18 MED ORDER — ALPRAZOLAM 1 MG PO TABS: 1.0000 mg | ORAL_TABLET | Freq: Two times a day (BID) | ORAL | 2 refills | Status: DC | PRN

## 2019-03-18 MED ORDER — MONTELUKAST SODIUM 10 MG PO TABS: ORAL_TABLET | ORAL | 11 refills | Status: DC

## 2019-03-23 NOTE — Progress Notes (Signed)
Telephone visit  Subjective: [id number] chronic conditions PCP: Terald Sleeper, PA-C [id number] Deanna [name] is a 58 y.o. female calls for telephone consult today. Patient provides verbal consent for consult held via phone.  Patient is identified with 2 separate identifiers.  At this time the entire area is on COVID-19 social distancing and stay home orders are in place.  Patient is of higher risk and therefore we are performing this by a virtual method.  Location of patient: home Location of provider: HOME Others present for call: no  Patient is having a follow-up for her chronic medical conditions.  They do include hypertension, attention deficit disorder.  She has not been able to take stimulant because it is raising her blood pressure up too high.  We have worked very diligently on getting this back in order.  She is having a significant amount of attention.  So therefore I am going to have her try Intuniv to see if this will help with concentration.  We will have her take it at bedtime. In addition she is starting with some bronchial symptoms.  She does get frequent bronchitis.  She is having some postnasal drip, cough with slight wheezing.  She denies any fever or chills. She also continues with her generalized anxiety, chronic abdominal pain, she has been making dietary changes and it does seem to be helping.  She has several food allergies and she is avoiding those foods.    ROS: Per HPI  Allergies  Allergen Reactions  . Iohexol Anaphylaxis    IBD dye   Past Medical History:  Diagnosis Date  . Allergy   . Anxiety   . Asthma   . Cancer ([place])    breast  . Depression   . Diverticulosis of sigmoid colon 07/14/2011  . GERD (gastroesophageal reflux disease)   . Hypertension   . Internal hemorrhoids   . Migraine   . PONV (postoperative nausea and vomiting)   . Tubular adenoma of colon     Current Outpatient Medications:  .  ALPRAZolam (XANAX) 1 MG tablet, Take 1 tablet  (1 mg total) by mouth 2 (two) times daily as needed for anxiety., Disp: 60 tablet, Rfl: 2 .  buPROPion (WELLBUTRIN XL) 150 MG 24 hr tablet, Take 3 tablets (450 mg total) by mouth daily., Disp: 90 tablet, Rfl: 11 .  CALCIUM-VITAMIN D PO, Take 1 tablet by mouth. 600 mg of calcium, Disp: , Rfl:  .  clindamycin (CLEOCIN) 300 MG capsule, Take 1 capsule (300 mg total) by mouth 3 (three) times daily., Disp: 30 capsule, Rfl: 0 .  clotrimazole (MYCELEX) 10 MG troche, Allow one to dissolve in the mouth 5 times daily For yeast, Disp: 35 tablet, Rfl: 2 .  dicyclomine (BENTYL) 20 MG tablet, Take 1 tablet (20 mg total) by mouth 3 (three) times daily as needed for spasms., Disp: 90 tablet, Rfl: 11 .  fluconazole (DIFLUCAN) 150 MG tablet, 1 po q week x 4 weeks, Disp: 4 tablet, Rfl: 0 .  Fluticasone-Salmeterol (ADVAIR DISKUS) 250-50 MCG/DOSE AEPB, Inhale 1 puff into the lungs 2 (two) times daily., Disp: 1 each, Rfl: 5 .  furosemide (LASIX) 20 MG tablet, Take 1 tablet (20 mg total) by mouth daily. (Needs to be seen before next refill), Disp: 30 tablet, Rfl: 11 .  guanFACINE (INTUNIV) 1 MG TB24 ER tablet, Take 1 tablet (1 mg total) by mouth daily., Disp: 30 tablet, Rfl: 1 .  Lysine 500 MG CAPS, Take 500 mg by mouth  daily., Disp: , Rfl:  .  mesalamine (LIALDA) 1.2 g EC tablet, Take 2 tablets (2.4 g total) by mouth daily with breakfast., Disp: 60 tablet, Rfl: 3 .  montelukast (SINGULAIR) 10 MG tablet, TAKE 1 TABLET ONCE DAILY IN THE EVENING., Disp: 30 tablet, Rfl: 11 .  olmesartan (BENICAR) 40 MG tablet, Take 1 tablet (40 mg total) by mouth every morning. (Needs to be seen before next refill), Disp: 30 tablet, Rfl: 11 .  omeprazole (PRILOSEC) 20 MG capsule, Take 1 capsule (20 mg total) by mouth daily., Disp: 30 capsule, Rfl: 11 .  Probiotic Product (MEGA PROBIOTIC PO), Take 1 capsule by mouth daily., Disp: , Rfl:  .  SUMAtriptan (IMITREX) 100 MG tablet, TAKE 1 TABLET ONCE DAILY AS NEEDED FOR MIGRAINE AS DIRECTED BY  PHYSICIAN., Disp: 30 tablet, Rfl: 11 .  valACYclovir (VALTREX) 1000 MG tablet, TAKE  (1)  TABLET  EVERY TWELVE HOURS., Disp: 20 tablet, Rfl: 5 .  VENTOLIN HFA 108 (90 Base) MCG/ACT inhaler, Inhale 2 puffs into the lungs every 6 (six) hours as needed for wheezing or shortness of breath., Disp: 18 g, Rfl: 11  Assessment/ Plan: 58 y.o. female   1. GAD (generalized anxiety disorder) - ALPRAZolam (XANAX) 1 MG tablet; Take 1 tablet (1 mg total) by mouth 2 (two) times daily as needed for anxiety.  Dispense: 60 tablet; Refill: 2  2. Chronic abdominal pain - omeprazole (PRILOSEC) 20 MG capsule; Take 1 capsule (20 mg total) by mouth daily.  Dispense: 30 capsule; Refill: 11  3. Bronchitis - Fluticasone-Salmeterol (ADVAIR DISKUS) 250-50 MCG/DOSE AEPB; Inhale 1 puff into the lungs 2 (two) times daily.  Dispense: 1 each; Refill: 5  4. Acute bronchitis, unspecified organism - VENTOLIN HFA 108 (90 Base) MCG/ACT inhaler; Inhale 2 puffs into the lungs every 6 (six) hours as needed for wheezing or shortness of breath.  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  Dispense: 30 tablet; Refill: 1   No follow-ups on file.  Continue all other maintenance medications as listed above.  Start time: 8:51 AM End time: 9:10 AM  Meds ordered this encounter  Medications  . furosemide (LASIX) 20 MG tablet    Sig: Take 1 tablet (20 mg total) by mouth daily. (Needs to be seen before next refill)    Dispense:  30 tablet    Refill:  11    Order Specific Question:   Supervising Provider    Answer:   Janora Norlander [id number]  . montelukast (SINGULAIR) 10 MG tablet    Sig: TAKE 1 TABLET ONCE DAILY IN THE EVENING.    Dispense:  30 tablet    Refill:  11    Order Specific Question:   Supervising Provider    Answer:   Janora Norlander [id number]  . buPROPion (WELLBUTRIN XL) 150 MG  24 hr tablet    Sig: Take 3 tablets (450 mg total) by mouth daily.    Dispense:  90 tablet    Refill:  11    Order Specific Question:   Supervising Provider    Answer:   Janora Norlander [id number]  . ALPRAZolam (XANAX) 1 MG tablet    Sig: Take 1 tablet (1 mg total) by mouth 2 (two) times daily as needed for anxiety.    Dispense:  60 tablet    Refill:  2  Order Specific Question:   Supervising Provider    Answer:   Janora Norlander [id number]  . olmesartan (BENICAR) 40 MG tablet    Sig: Take 1 tablet (40 mg total) by mouth every morning. (Needs to be seen before next refill)    Dispense:  30 tablet    Refill:  11    Order Specific Question:   Supervising Provider    Answer:   Janora Norlander [id number]  . SUMAtriptan (IMITREX) 100 MG tablet    Sig: TAKE 1 TABLET ONCE DAILY AS NEEDED FOR MIGRAINE AS DIRECTED BY PHYSICIAN.    Dispense:  30 tablet    Refill:  11    Order Specific Question:   Supervising Provider    Answer:   Janora Norlander [id number]  . omeprazole (PRILOSEC) 20 MG capsule    Sig: Take 1 capsule (20 mg total) by mouth daily.    Dispense:  30 capsule    Refill:  11    Order Specific Question:   Supervising Provider    Answer:   Janora Norlander [id number]  . Fluticasone-Salmeterol (ADVAIR DISKUS) 250-50 MCG/DOSE AEPB    Sig: Inhale 1 puff into the lungs 2 (two) times daily.    Dispense:  1 each    Refill:  5    Order Specific Question:   Supervising Provider    Answer:   Janora Norlander [id number]  . VENTOLIN HFA 108 (90 Base) MCG/ACT inhaler    Sig: Inhale 2 puffs into the lungs every 6 (six) hours as needed for wheezing or shortness of breath.    Dispense:  18 g    Refill:  11    Order Specific Question:   Supervising Provider    Answer:   Janora Norlander [id number]  . valACYclovir (VALTREX) 1000 MG tablet    Sig: TAKE  (1)  TABLET  EVERY TWELVE HOURS.    Dispense:  20 tablet    Refill:  5    Order Specific Question:   Supervising  Provider    Answer:   Janora Norlander [id number]  . guanFACINE (INTUNIV) 1 MG TB24 ER tablet    Sig: Take 1 tablet (1 mg total) by mouth daily.    Dispense:  30 tablet    Refill:  1    Order Specific Question:   Supervising Provider    Answer:   Janora Norlander [id number]    Particia Nearing PA-C [facility] [phone]

## 2019-04-14 ENCOUNTER — Other Ambulatory Visit: Payer: Self-pay | Admitting: Physician Assistant

## 2019-04-14 DIAGNOSIS — F411 Generalized anxiety disorder: Secondary | ICD-10-CM

## 2019-04-18 ENCOUNTER — Ambulatory Visit: Payer: 59 | Admitting: Physician Assistant

## 2019-04-25 ENCOUNTER — Other Ambulatory Visit: Payer: Self-pay

## 2019-04-26 ENCOUNTER — Other Ambulatory Visit: Payer: Self-pay

## 2019-04-26 ENCOUNTER — Encounter: Payer: Self-pay | Admitting: Physician Assistant

## 2019-04-26 ENCOUNTER — Ambulatory Visit: Payer: 59 | Admitting: Physician Assistant

## 2019-04-26 VITALS — BP 156/95 | HR 73 | Temp 98.9°F | Ht 64.0 in | Wt 237.0 lb

## 2019-04-26 DIAGNOSIS — F988 Other specified behavioral and emotional disorders with onset usually occurring in childhood and adolescence: Secondary | ICD-10-CM

## 2019-04-26 DIAGNOSIS — I10 Essential (primary) hypertension: Secondary | ICD-10-CM | POA: Diagnosis not present

## 2019-04-26 MED ORDER — AMLODIPINE BESYLATE 5 MG PO TABS: 5.0000 mg | ORAL_TABLET | Freq: Every day | ORAL | 1 refills | Status: DC

## 2019-04-26 MED ORDER — GUANFACINE HCL ER 3 MG PO TB24: 3.0000 mg | ORAL_TABLET | Freq: Every day | ORAL | 5 refills | Status: DC

## 2019-04-27 NOTE — Progress Notes (Signed)
BP (!) 156/95   Pulse 73   Temp 98.9 F (37.2 C) (Temporal)   Ht 5\' 4"  (1.626 m)   Wt 237 lb (107.5 kg)   SpO2 96%   BMI 40.68 kg/m    Subjective:    Patient ID: Deanna [name], female    DOB: Oct 31, 1960, 58 y.o.   MRN: [id number]  HPI: Deanna [name] is a 58 y.o. female presenting on 04/26/2019 for a one month follow up on ADD and hypertension. She is currently no on any medication for her blood pressure and feels like it may be time to start back on bp medication. Patient has been taking the Intuniv for her ADD and tolerating well.  Past Medical History:  Diagnosis Date  . Allergy   . Anxiety   . Asthma   . Cancer ([place])    breast  . Depression   . Diverticulosis of sigmoid colon 07/14/2011  . GERD (gastroesophageal reflux disease)   . Hypertension   . Internal hemorrhoids   . Migraine   . PONV (postoperative nausea and vomiting)   . Tubular adenoma of colon    Relevant past medical, surgical, family and social history reviewed and updated as indicated. Interim medical history since our last visit reviewed. Allergies and medications reviewed and updated. DATA REVIEWED: CHART IN EPIC  Family History reviewed for pertinent findings.  Review of Systems  Allergies as of 04/26/2019      Reactions   Iohexol Anaphylaxis   IBD dye   Clindamycin/lincomycin Nausea And Vomiting      Medication List       Accurate as of April 26, 2019 11:59 PM. If you have any questions, ask your nurse or doctor.        STOP taking these medications   clindamycin 300 MG capsule Commonly known as: Cleocin Stopped by: Terald Sleeper, PA-C   mesalamine 1.2 g EC tablet Commonly known as: LIALDA Stopped by: Terald Sleeper, PA-C     TAKE these medications   ALPRAZolam 1 MG tablet Commonly known as: XANAX Take 1 tablet (1 mg total) by mouth 2 (two) times daily as needed for anxiety.   amLODipine 5 MG tablet Commonly known as: NORVASC Take 1 tablet (5 mg total) by mouth daily.  Started by: Terald Sleeper, PA-C   buPROPion 150 MG 24 hr tablet Commonly known as: WELLBUTRIN XL Take 3 tablets (450 mg total) by mouth daily.   CALCIUM-VITAMIN D PO Take 1 tablet by mouth. 600 mg of calcium   clotrimazole 10 MG troche Commonly known as: MYCELEX Allow one to dissolve in the mouth 5 times daily For yeast   dicyclomine 20 MG tablet Commonly known as: Bentyl Take 1 tablet (20 mg total) by mouth 3 (three) times daily as needed for spasms.   fluconazole 150 MG tablet Commonly known as: Diflucan 1 po q week x 4 weeks   Fluticasone-Salmeterol 250-50 MCG/DOSE Aepb Commonly known as: Advair Diskus Inhale 1 puff into the lungs 2 (two) times daily.   furosemide 20 MG tablet Commonly known as: LASIX Take 1 tablet (20 mg total) by mouth daily. (Needs to be seen before next refill)   GuanFACINE HCl 3 MG Tb24 Take 1 tablet (3 mg total) by mouth daily. What changed:   medication strength  how much to take Changed by: Terald Sleeper, PA-C   Lysine 500 MG Caps Take 500 mg by mouth daily.   MEGA PROBIOTIC PO Take 1 capsule  by mouth daily.   montelukast 10 MG tablet Commonly known as: SINGULAIR TAKE 1 TABLET ONCE DAILY IN THE EVENING.   olmesartan 40 MG tablet Commonly known as: BENICAR Take 1 tablet (40 mg total) by mouth every morning. (Needs to be seen before next refill)   omeprazole 20 MG capsule Commonly known as: PRILOSEC Take 1 capsule (20 mg total) by mouth daily.   SUMAtriptan 100 MG tablet Commonly known as: IMITREX TAKE 1 TABLET ONCE DAILY AS NEEDED FOR MIGRAINE AS DIRECTED BY PHYSICIAN.   valACYclovir 1000 MG tablet Commonly known as: VALTREX TAKE  (1)  TABLET  EVERY TWELVE HOURS.   Ventolin HFA 108 (90 Base) MCG/ACT inhaler Generic drug: albuterol Inhale 2 puffs into the lungs every 6 (six) hours as needed for wheezing or shortness of breath.          Objective:    BP (!) 156/95   Pulse 73   Temp 98.9 F (37.2 C) (Temporal)   Ht  5\' 4"  (1.626 m)   Wt 237 lb (107.5 kg)   SpO2 96%   BMI 40.68 kg/m   Allergies  Allergen Reactions  . Iohexol Anaphylaxis    IBD dye  . Clindamycin/Lincomycin Nausea And Vomiting    Wt Readings from Last 3 Encounters:  04/26/19 237 lb (107.5 kg)  12/24/18 230 lb (104.3 kg)  12/17/18 230 lb (104.3 kg)    Physical Exam  Results for orders placed or performed in visit on 10/11/18  IgA  Result Value Ref Range   IgA 206 68 - 378 mg/dL  Tissue transglutaminase, IgA  Result Value Ref Range   (tTG) Ab, IgA 1 U/mL  Comprehensive metabolic panel  Result Value Ref Range   Sodium 139 135 - 145 mEq/L   Potassium 3.6 3.5 - 5.1 mEq/L   Chloride 105 96 - 112 mEq/L   CO2 25 19 - 32 mEq/L   Glucose, Bld 88 70 - 99 mg/dL   BUN 9 6 - 23 mg/dL   Creatinine, Ser 1.01 0.40 - 1.20 mg/dL   Total Bilirubin 0.4 0.2 - 1.2 mg/dL   Alkaline Phosphatase 79 39 - 117 U/L   AST 70 (H) 0 - 37 U/L   ALT 58 (H) 0 - 35 U/L   Total Protein 6.8 6.0 - 8.3 g/dL   Albumin 4.1 3.5 - 5.2 g/dL   Calcium 9.0 8.4 - 10.5 mg/dL   GFR 56.48 (L) >60.00 mL/min  CBC with Differential/Platelet  Result Value Ref Range   WBC 6.2 4.0 - 10.5 K/uL   RBC 4.92 3.87 - 5.11 Mil/uL   Hemoglobin 14.8 12.0 - 15.0 g/dL   HCT 43.7 36.0 - 46.0 %   MCV 88.8 78.0 - 100.0 fl   MCHC 34.0 30.0 - 36.0 g/dL   RDW 14.3 11.5 - 15.5 %   Platelets 320.0 150.0 - 400.0 K/uL   Neutrophils Relative % 55.8 43.0 - 77.0 %   Lymphocytes Relative 31.0 12.0 - 46.0 %   Monocytes Relative 10.2 3.0 - 12.0 %   Eosinophils Relative 2.2 0.0 - 5.0 %   Basophils Relative 0.8 0.0 - 3.0 %   Neutro Abs 3.5 1.4 - 7.7 K/uL   Lymphs Abs 1.9 0.7 - 4.0 K/uL   Monocytes Absolute 0.6 0.1 - 1.0 K/uL   Eosinophils Absolute 0.1 0.0 - 0.7 K/uL   Basophils Absolute 0.0 0.0 - 0.1 K/uL      Assessment & Plan:   1. Attention deficit disorder (ADD)  without hyperactivity - guanFACINE 3 MG TB24; Take 1 tablet (3 mg total) by mouth daily.  Dispense: 30 tablet; Refill:  5  2. Essential hypertension, benign - amLODipine (NORVASC) 5 MG tablet; Take 1 tablet (5 mg total) by mouth daily.  Dispense: 90 tablet; Refill: 1   Continue all other maintenance medications as listed above.  Follow up plan: Return in about 6 weeks (around 06/07/2019).  Educational handout given for ADD  Terald Sleeper PA-C [facility] [address]  [place], [place] [address] [phone]   04/27/2019, 12:37 PM

## 2019-06-06 ENCOUNTER — Other Ambulatory Visit: Payer: Self-pay

## 2019-06-07 ENCOUNTER — Ambulatory Visit: Payer: 59 | Admitting: Physician Assistant

## 2019-06-07 ENCOUNTER — Encounter: Payer: Self-pay | Admitting: Physician Assistant

## 2019-06-07 VITALS — BP 130/84 | HR 77 | Temp 96.2°F | Ht 64.0 in | Wt 231.8 lb

## 2019-06-07 DIAGNOSIS — F39 Unspecified mood [affective] disorder: Secondary | ICD-10-CM

## 2019-06-07 DIAGNOSIS — F988 Other specified behavioral and emotional disorders with onset usually occurring in childhood and adolescence: Secondary | ICD-10-CM | POA: Diagnosis not present

## 2019-06-07 MED ORDER — AMPHETAMINE-DEXTROAMPHET ER 15 MG PO CP24: 15.0000 mg | ORAL_CAPSULE | ORAL | 0 refills | Status: DC

## 2019-06-07 MED ORDER — ARIPIPRAZOLE 5 MG PO TABS: 5.0000 mg | ORAL_TABLET | Freq: Every day | ORAL | 1 refills | Status: DC

## 2019-06-07 NOTE — Progress Notes (Signed)
BP 130/84   Pulse 77   Temp (!) 96.2 F (35.7 C) (Temporal)   Ht 5\' 4"  (1.626 m)   Wt 231 lb 12.8 oz (105.1 kg)   SpO2 97%   BMI 39.79 kg/m    Subjective:    Patient ID: Deanna [name], female    DOB: 12-08-1960, 58 y.o.   MRN: [id number]  HPI: Deanna [name] is a 58 y.o. female presenting on 06/07/2019 for ADD and Hypertension  This patient comes in for recheck on her chronic medical conditions that do include ADD and hypertension.  She states that increasing the guanfacine did not let her feel any better.  She actually felt more normal.  She also endorsed the Vyvanse did not let her feel well also.  We had a long discussion about the possibility of her having a mood disorder with such a strong history and her mother.  Her mother was bipolar 1 and schizophrenic.  We are going to try a mood stabilizer and see if this can help with symptoms and go back to Adderall which is something she had taken in the past for the attention deficit.  She is willing to try this medicine. Depression screen [facility] 2/9 06/07/2019 04/26/2019 12/24/2018 12/17/2018 11/29/2018  Decreased Interest 1 0 0 0 0  Down, Depressed, Hopeless 1 0 0 0 0  PHQ - 2 Score 2 0 0 0 0  Altered sleeping 3 0 - 0 -  Tired, decreased energy 2 0 - 0 -  Change in appetite 2 0 - 0 -  Feeling bad or failure about yourself  2 0 - 0 -  Trouble concentrating 2 0 - 0 -  Moving slowly or fidgety/restless 2 0 - 0 -  Suicidal thoughts 0 0 - 0 -  PHQ-9 Score 15 0 - 0 -  Difficult doing work/chores Somewhat difficult - - - -  Some recent data might be hidden   GAD 7 : Generalized Anxiety Score 06/07/2019  Nervous, Anxious, on Edge 0  Control/stop worrying 0  Worry too much - different things 1  Trouble relaxing 2  Restless 0  Easily annoyed or irritable 2  Afraid - awful might happen 0  Total GAD 7 Score 5  Anxiety Difficulty Somewhat difficult      Past Medical History:  Diagnosis Date  . Cancer [facility])    breast  .  Diverticulosis of sigmoid colon 07/14/2011  . GERD (gastroesophageal reflux disease)   . Migraine   . PONV (postoperative nausea and vomiting)   . Tubular adenoma of colon    Relevant past medical, surgical, family and social history reviewed and updated as indicated. Interim medical history since our last visit reviewed. Allergies and medications reviewed and updated. DATA REVIEWED: CHART IN EPIC  Family History reviewed for pertinent findings.  Review of Systems  Constitutional: Negative.   HENT: Negative.   Eyes: Negative.   Respiratory: Negative.   Gastrointestinal: Negative.   Genitourinary: Negative.     Allergies as of 06/07/2019      Reactions   Iohexol Anaphylaxis   IBD dye   Clindamycin/lincomycin Nausea And Vomiting      Medication List       Accurate as of June 07, 2019 11:59 PM. If you have any questions, ask your nurse or doctor.        ALPRAZolam 1 MG tablet Commonly known as: XANAX Take 1 tablet (1 mg total) by mouth 2 (two) times daily as  needed for anxiety.   amLODipine 5 MG tablet Commonly known as: NORVASC Take 1 tablet (5 mg total) by mouth daily.   amphetamine-dextroamphetamine 15 MG 24 hr capsule Commonly known as: Adderall XR Take 1 capsule by mouth every morning. Started by: Terald Sleeper, PA-C   ARIPiprazole 5 MG tablet Commonly known as: Abilify Take 1 tablet (5 mg total) by mouth daily. Started by: Terald Sleeper, PA-C   buPROPion 150 MG 24 hr tablet Commonly known as: WELLBUTRIN XL Take 3 tablets (450 mg total) by mouth daily.   CALCIUM-VITAMIN D PO Take 1 tablet by mouth. 600 mg of calcium   clotrimazole 10 MG troche Commonly known as: MYCELEX Allow one to dissolve in the mouth 5 times daily For yeast   dicyclomine 20 MG tablet Commonly known as: Bentyl Take 1 tablet (20 mg total) by mouth 3 (three) times daily as needed for spasms.   fluconazole 150 MG tablet Commonly known as: Diflucan 1 po q week x 4 weeks    Fluticasone-Salmeterol 250-50 MCG/DOSE Aepb Commonly known as: Advair Diskus Inhale 1 puff into the lungs 2 (two) times daily.   furosemide 20 MG tablet Commonly known as: LASIX Take 1 tablet (20 mg total) by mouth daily. (Needs to be seen before next refill)   GuanFACINE HCl 3 MG Tb24 Take 1 tablet (3 mg total) by mouth daily.   Lysine 500 MG Caps Take 500 mg by mouth daily.   MEGA PROBIOTIC PO Take 1 capsule by mouth daily.   montelukast 10 MG tablet Commonly known as: SINGULAIR TAKE 1 TABLET ONCE DAILY IN THE EVENING.   olmesartan 40 MG tablet Commonly known as: BENICAR Take 1 tablet (40 mg total) by mouth every morning. (Needs to be seen before next refill)   omeprazole 20 MG capsule Commonly known as: PRILOSEC Take 1 capsule (20 mg total) by mouth daily.   SUMAtriptan 100 MG tablet Commonly known as: IMITREX TAKE 1 TABLET ONCE DAILY AS NEEDED FOR MIGRAINE AS DIRECTED BY PHYSICIAN.   valACYclovir 1000 MG tablet Commonly known as: VALTREX TAKE  (1)  TABLET  EVERY TWELVE HOURS.   Ventolin HFA 108 (90 Base) MCG/ACT inhaler Generic drug: albuterol Inhale 2 puffs into the lungs every 6 (six) hours as needed for wheezing or shortness of breath.          Objective:    BP 130/84   Pulse 77   Temp (!) 96.2 F (35.7 C) (Temporal)   Ht 5\' 4"  (1.626 m)   Wt 231 lb 12.8 oz (105.1 kg)   SpO2 97%   BMI 39.79 kg/m   Allergies  Allergen Reactions  . Iohexol Anaphylaxis    IBD dye  . Clindamycin/Lincomycin Nausea And Vomiting    Wt Readings from Last 3 Encounters:  06/07/19 231 lb 12.8 oz (105.1 kg)  04/26/19 237 lb (107.5 kg)  12/24/18 230 lb (104.3 kg)    Physical Exam Constitutional:      General: She is not in acute distress.    Appearance: Normal appearance. She is well-developed.  HENT:     Head: Normocephalic and atraumatic.  Cardiovascular:     Rate and Rhythm: Normal rate.  Pulmonary:     Effort: Pulmonary effort is normal.  Skin:    General:  Skin is warm and dry.     Findings: No rash.  Neurological:     Mental Status: She is alert and oriented to person, place, and time.  Deep Tendon Reflexes: Reflexes are normal and symmetric.     Results for orders placed or performed in visit on 10/11/18  IgA  Result Value Ref Range   IgA 206 68 - 378 mg/dL  Tissue transglutaminase, IgA  Result Value Ref Range   (tTG) Ab, IgA 1 U/mL  Comprehensive metabolic panel  Result Value Ref Range   Sodium 139 135 - 145 mEq/L   Potassium 3.6 3.5 - 5.1 mEq/L   Chloride 105 96 - 112 mEq/L   CO2 25 19 - 32 mEq/L   Glucose, Bld 88 70 - 99 mg/dL   BUN 9 6 - 23 mg/dL   Creatinine, Ser 1.01 0.40 - 1.20 mg/dL   Total Bilirubin 0.4 0.2 - 1.2 mg/dL   Alkaline Phosphatase 79 39 - 117 U/L   AST 70 (H) 0 - 37 U/L   ALT 58 (H) 0 - 35 U/L   Total Protein 6.8 6.0 - 8.3 g/dL   Albumin 4.1 3.5 - 5.2 g/dL   Calcium 9.0 8.4 - 10.5 mg/dL   GFR 56.48 (L) >60.00 mL/min  CBC with Differential/Platelet  Result Value Ref Range   WBC 6.2 4.0 - 10.5 K/uL   RBC 4.92 3.87 - 5.11 Mil/uL   Hemoglobin 14.8 12.0 - 15.0 g/dL   HCT 43.7 36.0 - 46.0 %   MCV 88.8 78.0 - 100.0 fl   MCHC 34.0 30.0 - 36.0 g/dL   RDW 14.3 11.5 - 15.5 %   Platelets 320.0 150.0 - 400.0 K/uL   Neutrophils Relative % 55.8 43.0 - 77.0 %   Lymphocytes Relative 31.0 12.0 - 46.0 %   Monocytes Relative 10.2 3.0 - 12.0 %   Eosinophils Relative 2.2 0.0 - 5.0 %   Basophils Relative 0.8 0.0 - 3.0 %   Neutro Abs 3.5 1.4 - 7.7 K/uL   Lymphs Abs 1.9 0.7 - 4.0 K/uL   Monocytes Absolute 0.6 0.1 - 1.0 K/uL   Eosinophils Absolute 0.1 0.0 - 0.7 K/uL   Basophils Absolute 0.0 0.0 - 0.1 K/uL      Assessment & Plan:   1. Mood disorder (HCC) - ARIPiprazole (ABILIFY) 5 MG tablet; Take 1 tablet (5 mg total) by mouth daily.  Dispense: 30 tablet; Refill: 1  2. Attention deficit disorder (ADD) without hyperactivity - amphetamine-dextroamphetamine (ADDERALL XR) 15 MG 24 hr capsule; Take 1 capsule by  mouth every morning.  Dispense: 30 capsule; Refill: 0   Continue all other maintenance medications as listed above.  Follow up plan: Return in about 4 weeks (around 07/05/2019) for recheck medications.  Educational handout given for mood disorder  Terald Sleeper PA-C [facility] [address]  [place], [place] [address] [phone]   06/09/2019, 9:13 PM

## 2019-06-07 NOTE — Patient Instructions (Signed)
Tardive Dyskinesia Tardive dyskinesia is a disorder that causes uncontrollable body movements. It occurs in some people who are taking certain medicines to treat a mental illness (neuroleptic medicine) or have taken this type of medicine in the past. These medicines block the effects of a specific brain chemical (dopamine). Sometimes, tardive dyskinesia starts months or years after someone took the medicine. Not everyone who takes a neuroleptic medicine will get tardive dyskinesia. What are the causes? This condition is caused by changes in your brain that are associated with taking a neuroleptic medicine. What increases the risk? If you are taking a neuroleptic medicine, your risk for tardive dyskinesia may be higher if you:  Are taking an older type of neuroleptic medicine.  Have been taking the medicine for a long time at a high dose.  Are a woman past the age of menopause.  Are older than 60.  Have a history of alcohol or drug abuse. What are the signs or symptoms? Abnormal, uncontrollable movements are the main symptom of tardive dyskinesia. These types of movements may include:  Grimacing.  Sticking out or twisting your tongue.  Making chewing or sucking sounds.  Making grunting or sighing noises.  Blinking your eyes.  Twisting, swaying, or thrusting your body.  Foot tapping or finger waving.  Rapid movements of your arms or legs. How is this diagnosed? Your health care provider may suspect that you have tardive dyskinesia if:  You have been taking neuroleptic medicines.  You have abnormal movements that you cannot control. If you are taking a medicine that can cause tardive dyskinesia, your health care provider may screen you for early signs of the condition. This may include:  Observing your body movements.  Using a specific rating scale called the Abnormal Involuntary Movement Scale (AIMS). You may also have tests to rule out other conditions that cause abnormal  body movements, including:  Parkinson's disease.  Huntington's disease.  Stroke. How is this treated? The best treatment for tardive dyskinesia is to lower the dose of your medicine or to switch to a different medicine at the first sign of abnormal and uncontrolled movements. There is no cure for long-term (chronic) tardive dyskinesia. Some medicines may help control the movements. These include:  Clozapine, a medicine used to treat mental illness (antipsychotic).  Some muscle relaxants.  Some anti-seizure medicines.  Some medicines used to treat high blood pressure.  Some tranquilizers (sedatives). Follow these instructions at home:      Take over-the-counter and prescription medicines only as told by your health care provider. Do not stop or start taking any medicines without talking to your health care provider first.  Do not abuse drugs or alcohol.  Keep all follow-up visits as told by your health care provider. This is important. Contact a health care provider if:  You are unable to eat or drink.  You have had a fall.  Your symptoms get worse. Summary  Tardive dyskinesia is a disorder that causes uncontrollable body movements. These may include grimacing, sticking out or twisting your tongue, blinking your eyes, or rapid movements of your arms or legs.  The condition occurs in some people who are taking certain medicines to treat a mental illness (neuroleptic medicine) or have taken this type of medicine in the past.  The best treatment for tardive dyskinesia is to lower the dose of your medicine or to switch to a different medicine at the first sign of abnormal and uncontrolled movements.  There is no cure for long-term (  chronic) tardive dyskinesia, but some medicines may help control the movements. This information is not intended to replace advice given to you by your health care provider. Make sure you discuss any questions you have with your health care  provider. Document Released: 07/04/2002 Document Revised: 08/06/2017 Document Reviewed: 08/06/2017 Elsevier Patient Education  2020 [employer].

## 2019-06-15 ENCOUNTER — Other Ambulatory Visit: Payer: Self-pay | Admitting: Physician Assistant

## 2019-06-15 DIAGNOSIS — J0101 Acute recurrent maxillary sinusitis: Secondary | ICD-10-CM

## 2019-06-16 ENCOUNTER — Encounter: Payer: Self-pay | Admitting: Family

## 2019-06-16 ENCOUNTER — Ambulatory Visit (INDEPENDENT_AMBULATORY_CARE_PROVIDER_SITE_OTHER): Payer: 59 | Admitting: Family

## 2019-06-16 DIAGNOSIS — Z20828 Contact with and (suspected) exposure to other viral communicable diseases: Secondary | ICD-10-CM

## 2019-06-16 DIAGNOSIS — Z20822 Contact with and (suspected) exposure to covid-19: Secondary | ICD-10-CM

## 2019-06-16 MED ORDER — PROMETHAZINE-DM 6.25-15 MG/5ML PO SYRP: 5.0000 mL | ORAL_SOLUTION | Freq: Three times a day (TID) | ORAL | 0 refills | Status: DC | PRN

## 2019-06-16 MED ORDER — BENZONATATE 200 MG PO CAPS: 200.0000 mg | ORAL_CAPSULE | Freq: Three times a day (TID) | ORAL | 1 refills | Status: DC | PRN

## 2019-06-16 NOTE — Progress Notes (Signed)
Virtual Visit via telephone Note Due to COVID-19 pandemic this visit was conducted virtually. This visit type was conducted due to national recommendations for restrictions regarding the COVID-19 Pandemic (e.g. social distancing, sheltering in place) in an effort to limit this patient's exposure and mitigate transmission in our community. All issues noted in this document were discussed and addressed.  A physical exam was not performed with this format.  I connected with Deanna [name] on 06/16/19 at 12:20 pm by telephone and verified that I am speaking with the correct person using two identifiers. Deanna [name] is currently located at home and no one is currently with her during visit. The provider, Evelina Dun, FNP is located in their office at time of visit.  I discussed the limitations, risks, security and privacy concerns of performing an evaluation and management service by telephone and the availability of in person appointments. I also discussed with the patient that there may be a patient responsible charge related to this service. The patient expressed understanding and agreed to proceed.   History and Present Illness:  PT calls the office today with cough. She reports she was COVID tested today.  Cough This is a new problem. The current episode started in the past 7 days. The problem has been gradually worsening. The problem occurs every few minutes. The cough is non-productive. Associated symptoms include ear pain, headaches, nasal congestion, postnasal drip, rhinorrhea and shortness of breath (with walking). Pertinent negatives include no chills, ear congestion, fever, myalgias or sore throat. The symptoms are aggravated by lying down. She has tried rest and OTC cough suppressant for the symptoms. The treatment provided mild relief.        Review of Systems  Constitutional: Negative for chills and fever.  HENT: Positive for ear pain, postnasal drip and rhinorrhea. Negative  for sore throat.   Respiratory: Positive for cough and shortness of breath (with walking).   Musculoskeletal: Negative for myalgias.  Neurological: Positive for headaches.  All other systems reviewed and are negative.    Observations/Objective: No SOB or distress noted, intermittent dry cough  Assessment and Plan: 1. Encounter by telehealth for suspected COVID-19 Pt will let us know her results of COVID test Rest Continue to self isolate until results of test Force fluids - benzonatate (TESSALON) 200 MG capsule; Take 1 capsule (200 mg total) by mouth 3 (three) times daily as needed.  Dispense: 30 capsule; Refill: 1 - promethazine-dextromethorphan (PROMETHAZINE-DM) 6.25-15 MG/5ML syrup; Take 5 mLs by mouth 3 (three) times daily as needed for cough.  Dispense: 118 mL; Refill: 0 - MyChart COVID-19 home monitoring program; Future     I discussed the assessment and treatment plan with the patient. The patient was provided an opportunity to ask questions and all were answered. The patient agreed with the plan and demonstrated an understanding of the instructions.   The patient was advised to call back or seek an in-person evaluation if the symptoms worsen or if the condition fails to improve as anticipated.  The above assessment and management plan was discussed with the patient. The patient verbalized understanding of and has agreed to the management plan. Patient is aware to call the clinic if symptoms persist or worsen. Patient is aware when to return to the clinic for a follow-up visit. Patient educated on when it is appropriate to go to the emergency department.   Time call ended:  12:34 pm  I provided 14 minutes of non-face-to-face time during this encounter.  Evelina Dun, FNP

## 2019-06-20 ENCOUNTER — Telehealth: Payer: Self-pay | Admitting: Physician Assistant

## 2019-06-20 ENCOUNTER — Other Ambulatory Visit: Payer: Self-pay | Admitting: Physician Assistant

## 2019-06-20 DIAGNOSIS — J40 Bronchitis, not specified as acute or chronic: Secondary | ICD-10-CM

## 2019-06-20 DIAGNOSIS — J209 Acute bronchitis, unspecified: Secondary | ICD-10-CM

## 2019-06-20 MED ORDER — AZITHROMYCIN 250 MG PO TABS: ORAL_TABLET | ORAL | 0 refills | Status: DC

## 2019-06-20 MED ORDER — VENTOLIN HFA 108 (90 BASE) MCG/ACT IN AERS: 2.0000 | INHALATION_SPRAY | Freq: Four times a day (QID) | RESPIRATORY_TRACT | 11 refills | Status: AC | PRN

## 2019-06-20 MED ORDER — FLUTICASONE-SALMETEROL 250-50 MCG/DOSE IN AEPB: 1.0000 | INHALATION_SPRAY | Freq: Two times a day (BID) | RESPIRATORY_TRACT | 5 refills | Status: DC

## 2019-06-20 MED ORDER — LEVOFLOXACIN 500 MG PO TABS: 500.0000 mg | ORAL_TABLET | Freq: Every day | ORAL | 0 refills | Status: DC

## 2019-06-20 MED ORDER — PREDNISONE 10 MG (21) PO TBPK: ORAL_TABLET | ORAL | 0 refills | Status: DC

## 2019-06-20 NOTE — Telephone Encounter (Signed)
Aware. 

## 2019-06-20 NOTE — Telephone Encounter (Signed)
Please advise on request for medication due to televisit last week.

## 2019-06-20 NOTE — Telephone Encounter (Signed)
Zpak and prednisone prescription sent to pharmacy

## 2019-07-08 ENCOUNTER — Ambulatory Visit: Payer: 59 | Admitting: Physician Assistant

## 2019-09-06 ENCOUNTER — Ambulatory Visit (INDEPENDENT_AMBULATORY_CARE_PROVIDER_SITE_OTHER): Payer: 59 | Admitting: Physician Assistant

## 2019-09-06 ENCOUNTER — Encounter: Payer: Self-pay | Admitting: Physician Assistant

## 2019-09-06 DIAGNOSIS — J209 Acute bronchitis, unspecified: Secondary | ICD-10-CM

## 2019-09-06 DIAGNOSIS — J0111 Acute recurrent frontal sinusitis: Secondary | ICD-10-CM

## 2019-09-06 DIAGNOSIS — J45909 Unspecified asthma, uncomplicated: Secondary | ICD-10-CM | POA: Diagnosis not present

## 2019-09-06 MED ORDER — FLUTICASONE PROPIONATE 50 MCG/ACT NA SUSP: 2.0000 | Freq: Every day | NASAL | 6 refills | Status: AC

## 2019-09-06 MED ORDER — PREDNISONE 10 MG (21) PO TBPK: ORAL_TABLET | ORAL | 0 refills | Status: DC

## 2019-09-06 MED ORDER — LEVOFLOXACIN 500 MG PO TABS: 500.0000 mg | ORAL_TABLET | Freq: Every day | ORAL | 0 refills | Status: DC

## 2019-09-06 NOTE — Progress Notes (Signed)
Telephone visit  Subjective: CC: Flareup of bronchitis and sinusitis PCP: Terald Sleeper, PA-C [id number] Deanna [name] is a 59 y.o. female calls for telephone consult today. Patient provides verbal consent for consult held via phone.  Patient is identified with 2 separate identifiers.  At this time the entire area is on COVID-19 social distancing and stay home orders are in place.  Patient is of higher risk and therefore we are performing this by a virtual method.  Location of patient: Home Location of provider: HOME Others present for call: No  The picture frequently gets bronchitis and sinusitis.  She does have an FMLA for this.  We will forward this note on to our nurse department that handles these.  The patient is supposed to get FMLA papers faxed from her company to my attention or attention [employer].   Patient with several days of progressing upper respiratory and bronchial symptoms. Initially there was more upper respiratory congestion. This progressed to having significant cough that is productive throughout the day and severe at night. There is occasional wheezing after coughing. Sometimes there is slight dyspnea on exertion. It is productive mucus that is yellow in color. Denies any blood. This patient has had many days of sinus headache and postnasal drainage. There is copious drainage at times. Denies any fever at this time. There has been a history of sinus infections in the past.  No history of sinus surgery. There is cough at night. It has become more prevalent in recent days.  Patient did have a negative Covid test on 09/02/2019.  She was treated at an urgent care but with only 5 days of medicine and she has not gotten better.  FMLA Out  09/05/19-09/11/2019   ROS: Per HPI  Allergies  Allergen Reactions  . Iohexol Anaphylaxis    IBD dye  . Clindamycin/Lincomycin Nausea And Vomiting   Past Medical History:  Diagnosis Date  . Cancer [facility])    breast  .  Diverticulosis of sigmoid colon 07/14/2011  . GERD (gastroesophageal reflux disease)   . Migraine   . PONV (postoperative nausea and vomiting)   . Tubular adenoma of colon     Current Outpatient Medications:  .  ALPRAZolam (XANAX) 1 MG tablet, Take 1 tablet (1 mg total) by mouth 2 (two) times daily as needed for anxiety., Disp: 60 tablet, Rfl: 2 .  amLODipine (NORVASC) 5 MG tablet, Take 1 tablet (5 mg total) by mouth daily., Disp: 90 tablet, Rfl: 1 .  amphetamine-dextroamphetamine (ADDERALL XR) 15 MG 24 hr capsule, Take 1 capsule by mouth every morning., Disp: 30 capsule, Rfl: 0 .  ARIPiprazole (ABILIFY) 5 MG tablet, Take 1 tablet (5 mg total) by mouth daily., Disp: 30 tablet, Rfl: 1 .  benzonatate (TESSALON) 200 MG capsule, Take 1 capsule (200 mg total) by mouth 3 (three) times daily as needed., Disp: 30 capsule, Rfl: 1 .  buPROPion (WELLBUTRIN XL) 150 MG 24 hr tablet, Take 3 tablets (450 mg total) by mouth daily., Disp: 90 tablet, Rfl: 11 .  CALCIUM-VITAMIN D PO, Take 1 tablet by mouth. 600 mg of calcium, Disp: , Rfl:  .  clotrimazole (MYCELEX) 10 MG troche, Allow one to dissolve in the mouth 5 times daily For yeast, Disp: 35 tablet, Rfl: 2 .  dicyclomine (BENTYL) 20 MG tablet, Take 1 tablet (20 mg total) by mouth 3 (three) times daily as needed for spasms., Disp: 90 tablet, Rfl: 11 .  fluconazole (DIFLUCAN) 150 MG tablet, 1  po q week x 4 weeks, Disp: 4 tablet, Rfl: 0 .  fluticasone (FLONASE) 50 MCG/ACT nasal spray, Place 2 sprays into both nostrils daily., Disp: 48 g, Rfl: 6 .  Fluticasone-Salmeterol (ADVAIR DISKUS) 250-50 MCG/DOSE AEPB, Inhale 1 puff into the lungs 2 (two) times daily., Disp: 1 each, Rfl: 5 .  furosemide (LASIX) 20 MG tablet, Take 1 tablet (20 mg total) by mouth daily. (Needs to be seen before next refill), Disp: 30 tablet, Rfl: 11 .  levofloxacin (LEVAQUIN) 500 MG tablet, Take 1 tablet (500 mg total) by mouth daily., Disp: 14 tablet, Rfl: 0 .  Lysine 500 MG CAPS, Take 500  mg by mouth daily., Disp: , Rfl:  .  montelukast (SINGULAIR) 10 MG tablet, TAKE 1 TABLET ONCE DAILY IN THE EVENING., Disp: 30 tablet, Rfl: 11 .  olmesartan (BENICAR) 40 MG tablet, Take 1 tablet (40 mg total) by mouth every morning. (Needs to be seen before next refill), Disp: 30 tablet, Rfl: 11 .  omeprazole (PRILOSEC) 20 MG capsule, Take 1 capsule (20 mg total) by mouth daily., Disp: 30 capsule, Rfl: 11 .  predniSONE (STERAPRED UNI-PAK 21 TAB) 10 MG (21) TBPK tablet, Use as directed, Disp: 21 tablet, Rfl: 0 .  Probiotic Product (MEGA PROBIOTIC PO), Take 1 capsule by mouth daily., Disp: , Rfl:  .  promethazine-dextromethorphan (PROMETHAZINE-DM) 6.25-15 MG/5ML syrup, Take 5 mLs by mouth 3 (three) times daily as needed for cough., Disp: 118 mL, Rfl: 0 .  SUMAtriptan (IMITREX) 100 MG tablet, TAKE 1 TABLET ONCE DAILY AS NEEDED FOR MIGRAINE AS DIRECTED BY PHYSICIAN., Disp: 30 tablet, Rfl: 11 .  valACYclovir (VALTREX) 1000 MG tablet, TAKE  (1)  TABLET  EVERY TWELVE HOURS., Disp: 20 tablet, Rfl: 5 .  VENTOLIN HFA 108 (90 Base) MCG/ACT inhaler, Inhale 2 puffs into the lungs every 6 (six) hours as needed for wheezing or shortness of breath., Disp: 18 g, Rfl: 11  Assessment/ Plan: 59 y.o. female   1. Acute recurrent frontal sinusitis - levofloxacin (LEVAQUIN) 500 MG tablet; Take 1 tablet (500 mg total) by mouth daily.  Dispense: 14 tablet; Refill: 0 - fluticasone (FLONASE) 50 MCG/ACT nasal spray; Place 2 sprays into both nostrils daily.  Dispense: 48 g; Refill: 6 - predniSONE (STERAPRED UNI-PAK 21 TAB) 10 MG (21) TBPK tablet; Use as directed  Dispense: 21 tablet; Refill: 0  2. Bronchitis with asthma, subacute - levofloxacin (LEVAQUIN) 500 MG tablet; Take 1 tablet (500 mg total) by mouth daily.  Dispense: 14 tablet; Refill: 0 - predniSONE (STERAPRED UNI-PAK 21 TAB) 10 MG (21) TBPK tablet; Use as directed  Dispense: 21 tablet; Refill: 0  Out of work 2/8 through 09/11/2019.  With a plan return to work date  of 09/12/2019.                                Keep regular follow-up that was scheduled  Continue all other maintenance medications as listed above.  Start time: 1:42 PM End time: 1:53 PM  Meds ordered this encounter  Medications  . levofloxacin (LEVAQUIN) 500 MG tablet    Sig: Take 1 tablet (500 mg total) by mouth daily.    Dispense:  14 tablet    Refill:  0    Order Specific Question:   Supervising Provider    Answer:   Janora Norlander [id number]  . fluticasone (FLONASE) 50 MCG/ACT nasal spray    Sig: Place 2 sprays into  both nostrils daily.    Dispense:  48 g    Refill:  6    Order Specific Question:   Supervising Provider    Answer:   Janora Norlander [id number]  . predniSONE (STERAPRED UNI-PAK 21 TAB) 10 MG (21) TBPK tablet    Sig: Use as directed    Dispense:  21 tablet    Refill:  0    Order Specific Question:   Supervising Provider    Answer:   Janora Norlander [id number]    Particia Nearing PA-C [place] [phone]

## 2019-09-12 ENCOUNTER — Telehealth: Payer: Self-pay | Admitting: Physician Assistant

## 2019-09-12 NOTE — Telephone Encounter (Signed)
Spoke with patient and advised her we will place a work note up front for her to pick up.  New work note excusing from 09/05/2019 through 09/16/2019.

## 2019-09-12 NOTE — Telephone Encounter (Signed)
Okay to do this week if needed

## 2019-09-20 ENCOUNTER — Encounter: Payer: Self-pay | Admitting: Physician Assistant

## 2019-09-20 ENCOUNTER — Ambulatory Visit (INDEPENDENT_AMBULATORY_CARE_PROVIDER_SITE_OTHER): Payer: 59 | Admitting: Physician Assistant

## 2019-09-20 DIAGNOSIS — J0111 Acute recurrent frontal sinusitis: Secondary | ICD-10-CM | POA: Diagnosis not present

## 2019-09-20 DIAGNOSIS — J209 Acute bronchitis, unspecified: Secondary | ICD-10-CM

## 2019-09-20 DIAGNOSIS — J45909 Unspecified asthma, uncomplicated: Secondary | ICD-10-CM | POA: Diagnosis not present

## 2019-09-20 MED ORDER — PREDNISONE 10 MG PO TABS: 10.0000 mg | ORAL_TABLET | Freq: Every day | ORAL | 0 refills | Status: DC

## 2019-09-20 MED ORDER — AMOXICILLIN 500 MG PO CAPS: 500.0000 mg | ORAL_CAPSULE | Freq: Three times a day (TID) | ORAL | 0 refills | Status: DC

## 2019-09-20 MED ORDER — FLUCONAZOLE 150 MG PO TABS: ORAL_TABLET | ORAL | 0 refills | Status: DC

## 2019-09-20 NOTE — Progress Notes (Signed)
814      Telephone visit  Subjective: CC:**sinusitis, bronchitis PCP: Terald Sleeper, PA-C [id number] Deanna [name] is a 59 y.o. female calls for telephone consult today. Patient provides verbal consent for consult held via phone.  Patient is identified with 2 separate identifiers.  At this time the entire area is on COVID-19 social distancing and stay home orders are in place.  Patient is of higher risk and therefore we are performing this by a virtual method.  Location of patient: home Location of provider: [facility] Others present for call: no  Last day of prednisone was over the weekend, 5 days of antibiotics This patient with known recurrent history of sinusitis and bronchitis has continued to have sinus pressure, postnasal drainage, cough.  It is worse at night.  She denies having any fever.  She did take a short course of prednisone but does not feel that it completely helped.  Her ear still is very very blocked.  She is using Flonase at this time.    ROS: Per HPI  Allergies  Allergen Reactions  . Iohexol Anaphylaxis    IBD dye  . Clindamycin/Lincomycin Nausea And Vomiting   Past Medical History:  Diagnosis Date  . Cancer [facility])    breast  . Diverticulosis of sigmoid colon 07/14/2011  . GERD (gastroesophageal reflux disease)   . Migraine   . PONV (postoperative nausea and vomiting)   . Tubular adenoma of colon     Current Outpatient Medications:  .  ALPRAZolam (XANAX) 1 MG tablet, Take 1 tablet (1 mg total) by mouth 2 (two) times daily as needed for anxiety., Disp: 60 tablet, Rfl: 2 .  amLODipine (NORVASC) 5 MG tablet, Take 1 tablet (5 mg total) by mouth daily., Disp: 90 tablet, Rfl: 1 .  amphetamine-dextroamphetamine (ADDERALL XR) 15 MG 24 hr capsule, Take 1 capsule by mouth every morning., Disp: 30 capsule, Rfl: 0 .  ARIPiprazole (ABILIFY) 5 MG tablet, Take 1 tablet (5 mg total) by mouth daily., Disp: 30 tablet, Rfl: 1 .  benzonatate (TESSALON) 200 MG capsule, Take 1  capsule (200 mg total) by mouth 3 (three) times daily as needed., Disp: 30 capsule, Rfl: 1 .  buPROPion (WELLBUTRIN XL) 150 MG 24 hr tablet, Take 3 tablets (450 mg total) by mouth daily., Disp: 90 tablet, Rfl: 11 .  CALCIUM-VITAMIN D PO, Take 1 tablet by mouth. 600 mg of calcium, Disp: , Rfl:  .  clotrimazole (MYCELEX) 10 MG troche, Allow one to dissolve in the mouth 5 times daily For yeast, Disp: 35 tablet, Rfl: 2 .  dicyclomine (BENTYL) 20 MG tablet, Take 1 tablet (20 mg total) by mouth 3 (three) times daily as needed for spasms., Disp: 90 tablet, Rfl: 11 .  fluconazole (DIFLUCAN) 150 MG tablet, 1 po q week x 4 weeks, Disp: 4 tablet, Rfl: 0 .  fluticasone (FLONASE) 50 MCG/ACT nasal spray, Place 2 sprays into both nostrils daily., Disp: 48 g, Rfl: 6 .  Fluticasone-Salmeterol (ADVAIR DISKUS) 250-50 MCG/DOSE AEPB, Inhale 1 puff into the lungs 2 (two) times daily., Disp: 1 each, Rfl: 5 .  furosemide (LASIX) 20 MG tablet, Take 1 tablet (20 mg total) by mouth daily. (Needs to be seen before next refill), Disp: 30 tablet, Rfl: 11 .  levofloxacin (LEVAQUIN) 500 MG tablet, Take 1 tablet (500 mg total) by mouth daily., Disp: 14 tablet, Rfl: 0 .  Lysine 500 MG CAPS, Take 500 mg by mouth daily., Disp: , Rfl:  .  montelukast (SINGULAIR) 10  MG tablet, TAKE 1 TABLET ONCE DAILY IN THE EVENING., Disp: 30 tablet, Rfl: 11 .  olmesartan (BENICAR) 40 MG tablet, Take 1 tablet (40 mg total) by mouth every morning. (Needs to be seen before next refill), Disp: 30 tablet, Rfl: 11 .  omeprazole (PRILOSEC) 20 MG capsule, Take 1 capsule (20 mg total) by mouth daily., Disp: 30 capsule, Rfl: 11 .  predniSONE (STERAPRED UNI-PAK 21 TAB) 10 MG (21) TBPK tablet, Use as directed, Disp: 21 tablet, Rfl: 0 .  Probiotic Product (MEGA PROBIOTIC PO), Take 1 capsule by mouth daily., Disp: , Rfl:  .  promethazine-dextromethorphan (PROMETHAZINE-DM) 6.25-15 MG/5ML syrup, Take 5 mLs by mouth 3 (three) times daily as needed for cough., Disp: 118  mL, Rfl: 0 .  SUMAtriptan (IMITREX) 100 MG tablet, TAKE 1 TABLET ONCE DAILY AS NEEDED FOR MIGRAINE AS DIRECTED BY PHYSICIAN., Disp: 30 tablet, Rfl: 11 .  valACYclovir (VALTREX) 1000 MG tablet, TAKE  (1)  TABLET  EVERY TWELVE HOURS., Disp: 20 tablet, Rfl: 5 .  VENTOLIN HFA 108 (90 Base) MCG/ACT inhaler, Inhale 2 puffs into the lungs every 6 (six) hours as needed for wheezing or shortness of breath., Disp: 18 g, Rfl: 11  Assessment/ Plan: 59 y.o. female   1. Acute recurrent frontal sinusitis - amoxicillin (AMOXIL) 500 MG capsule; Take 1 capsule (500 mg total) by mouth 3 (three) times daily.  Dispense: 30 capsule; Refill: 0 - fluconazole (DIFLUCAN) 150 MG tablet; 1 po q week x 4 weeks  Dispense: 4 tablet; Refill: 0 - predniSONE (DELTASONE) 10 MG tablet; Take 1 tablet (10 mg total) by mouth daily with breakfast.  Dispense: 20 tablet; Refill: 0  2. Bronchitis with asthma, subacute - amoxicillin (AMOXIL) 500 MG capsule; Take 1 capsule (500 mg total) by mouth 3 (three) times daily.  Dispense: 30 capsule; Refill: 0 - predniSONE (DELTASONE) 10 MG tablet; Take 1 tablet (10 mg total) by mouth daily with breakfast.  Dispense: 20 tablet; Refill: 0   No follow-ups on file.  Continue all other maintenance medications as listed above.  Start time: 8:12 AM End time: 8:23 AM  No orders of the defined types were placed in this encounter.   Particia Nearing PA-C [place] [phone]

## 2019-09-22 ENCOUNTER — Ambulatory Visit: Payer: 59 | Admitting: Physician Assistant

## 2019-09-26 ENCOUNTER — Encounter: Payer: Self-pay | Admitting: Physician Assistant

## 2019-09-26 ENCOUNTER — Ambulatory Visit (INDEPENDENT_AMBULATORY_CARE_PROVIDER_SITE_OTHER): Payer: 59 | Admitting: Physician Assistant

## 2019-09-26 DIAGNOSIS — J209 Acute bronchitis, unspecified: Secondary | ICD-10-CM | POA: Diagnosis not present

## 2019-09-26 DIAGNOSIS — J45909 Unspecified asthma, uncomplicated: Secondary | ICD-10-CM

## 2019-09-26 NOTE — Progress Notes (Signed)
Telephone visit  Subjective: CC: Recheck bronchitis PCP: Terald Sleeper, PA-C [id number] Deanna [name] is a 59 y.o. female calls for telephone consult today. Patient provides verbal consent for consult held via phone.  Patient is identified with 2 separate identifiers.  At this time the entire area is on COVID-19 social distancing and stay home orders are in place.  Patient is of higher risk and therefore we are performing this by a virtual method.  Location of patient: Home Location of provider: [facility] Others present for call: No  This patient has had a severe bronchitis over the past month.  She does feel that she is starting to make a turn.  She is generally feeling better however whenever she walks any little bit of distance she takes about 15 minutes to recover from her shortness of breath.  She is using the inhalers.  I encouraged her to do the Advair twice daily and also continue with rescue albuterol.  At this time we will extend her note another week.  She will be out of work 2/8 through 10/02/2019   ROS: Per HPI  Allergies  Allergen Reactions  . Iohexol Anaphylaxis    IBD dye  . Clindamycin/Lincomycin Nausea And Vomiting   Past Medical History:  Diagnosis Date  . Cancer [facility])    breast  . Diverticulosis of sigmoid colon 07/14/2011  . GERD (gastroesophageal reflux disease)   . Migraine   . PONV (postoperative nausea and vomiting)   . Tubular adenoma of colon     Current Outpatient Medications:  .  ALPRAZolam (XANAX) 1 MG tablet, Take 1 tablet (1 mg total) by mouth 2 (two) times daily as needed for anxiety., Disp: 60 tablet, Rfl: 2 .  amLODipine (NORVASC) 5 MG tablet, Take 1 tablet (5 mg total) by mouth daily., Disp: 90 tablet, Rfl: 1 .  amoxicillin (AMOXIL) 500 MG capsule, Take 1 capsule (500 mg total) by mouth 3 (three) times daily., Disp: 30 capsule, Rfl: 0 .  amphetamine-dextroamphetamine (ADDERALL XR) 15 MG 24 hr capsule, Take 1 capsule by mouth every morning.,  Disp: 30 capsule, Rfl: 0 .  ARIPiprazole (ABILIFY) 5 MG tablet, Take 1 tablet (5 mg total) by mouth daily., Disp: 30 tablet, Rfl: 1 .  benzonatate (TESSALON) 200 MG capsule, Take 1 capsule (200 mg total) by mouth 3 (three) times daily as needed., Disp: 30 capsule, Rfl: 1 .  buPROPion (WELLBUTRIN XL) 150 MG 24 hr tablet, Take 3 tablets (450 mg total) by mouth daily., Disp: 90 tablet, Rfl: 11 .  CALCIUM-VITAMIN D PO, Take 1 tablet by mouth. 600 mg of calcium, Disp: , Rfl:  .  clotrimazole (MYCELEX) 10 MG troche, Allow one to dissolve in the mouth 5 times daily For yeast, Disp: 35 tablet, Rfl: 2 .  dicyclomine (BENTYL) 20 MG tablet, Take 1 tablet (20 mg total) by mouth 3 (three) times daily as needed for spasms., Disp: 90 tablet, Rfl: 11 .  fluconazole (DIFLUCAN) 150 MG tablet, 1 po q week x 4 weeks, Disp: 4 tablet, Rfl: 0 .  fluconazole (DIFLUCAN) 150 MG tablet, 1 po q week x 4 weeks, Disp: 4 tablet, Rfl: 0 .  fluticasone (FLONASE) 50 MCG/ACT nasal spray, Place 2 sprays into both nostrils daily., Disp: 48 g, Rfl: 6 .  Fluticasone-Salmeterol (ADVAIR DISKUS) 250-50 MCG/DOSE AEPB, Inhale 1 puff into the lungs 2 (two) times daily., Disp: 1 each, Rfl: 5 .  furosemide (LASIX) 20 MG tablet, Take 1 tablet (20 mg  total) by mouth daily. (Needs to be seen before next refill), Disp: 30 tablet, Rfl: 11 .  Lysine 500 MG CAPS, Take 500 mg by mouth daily., Disp: , Rfl:  .  montelukast (SINGULAIR) 10 MG tablet, TAKE 1 TABLET ONCE DAILY IN THE EVENING., Disp: 30 tablet, Rfl: 11 .  olmesartan (BENICAR) 40 MG tablet, Take 1 tablet (40 mg total) by mouth every morning. (Needs to be seen before next refill), Disp: 30 tablet, Rfl: 11 .  omeprazole (PRILOSEC) 20 MG capsule, Take 1 capsule (20 mg total) by mouth daily., Disp: 30 capsule, Rfl: 11 .  predniSONE (DELTASONE) 10 MG tablet, Take 1 tablet (10 mg total) by mouth daily with breakfast., Disp: 20 tablet, Rfl: 0 .  Probiotic Product (MEGA PROBIOTIC PO), Take 1 capsule by  mouth daily., Disp: , Rfl:  .  promethazine-dextromethorphan (PROMETHAZINE-DM) 6.25-15 MG/5ML syrup, Take 5 mLs by mouth 3 (three) times daily as needed for cough., Disp: 118 mL, Rfl: 0 .  SUMAtriptan (IMITREX) 100 MG tablet, TAKE 1 TABLET ONCE DAILY AS NEEDED FOR MIGRAINE AS DIRECTED BY PHYSICIAN., Disp: 30 tablet, Rfl: 11 .  valACYclovir (VALTREX) 1000 MG tablet, TAKE  (1)  TABLET  EVERY TWELVE HOURS., Disp: 20 tablet, Rfl: 5 .  VENTOLIN HFA 108 (90 Base) MCG/ACT inhaler, Inhale 2 puffs into the lungs every 6 (six) hours as needed for wheezing or shortness of breath., Disp: 18 g, Rfl: 11  Assessment/ Plan: 59 y.o. female   1. Bronchitis with asthma, subacute Continue all current therapies   No follow-ups on file.  Continue all other maintenance medications as listed above.  Start time: 8:15 AM End time: 8:22 AM  No orders of the defined types were placed in this encounter.   Particia Nearing PA-C [place] [phone]

## 2019-10-13 ENCOUNTER — Other Ambulatory Visit: Payer: Self-pay | Admitting: Physician Assistant

## 2019-10-13 DIAGNOSIS — I10 Essential (primary) hypertension: Secondary | ICD-10-CM

## 2019-10-13 DIAGNOSIS — F411 Generalized anxiety disorder: Secondary | ICD-10-CM

## 2019-10-14 ENCOUNTER — Ambulatory Visit: Payer: 59 | Admitting: Physician Assistant

## 2019-10-14 ENCOUNTER — Encounter: Payer: Self-pay | Admitting: Family Medicine

## 2019-10-14 ENCOUNTER — Ambulatory Visit (INDEPENDENT_AMBULATORY_CARE_PROVIDER_SITE_OTHER): Payer: 59 | Admitting: Family Medicine

## 2019-10-14 DIAGNOSIS — K2100 Gastro-esophageal reflux disease with esophagitis, without bleeding: Secondary | ICD-10-CM

## 2019-10-14 DIAGNOSIS — J45909 Unspecified asthma, uncomplicated: Secondary | ICD-10-CM

## 2019-10-14 DIAGNOSIS — J40 Bronchitis, not specified as acute or chronic: Secondary | ICD-10-CM

## 2019-10-14 DIAGNOSIS — J209 Acute bronchitis, unspecified: Secondary | ICD-10-CM

## 2019-10-14 DIAGNOSIS — J329 Chronic sinusitis, unspecified: Secondary | ICD-10-CM | POA: Diagnosis not present

## 2019-10-14 MED ORDER — OMEPRAZOLE 40 MG PO CPDR: 40.0000 mg | DELAYED_RELEASE_CAPSULE | Freq: Every day | ORAL | 2 refills | Status: DC

## 2019-10-14 NOTE — Progress Notes (Signed)
Subjective:    Patient ID: Deanna [name], female    DOB: 09/08/1960, 59 y.o.   MRN: [id number]   HPI: Deanna [name] is a 59 y.o. female presenting for trying to figure out what is going on for me. Out of work this week. Saw ENT earlier this week for sinuses. Now diverticulitis flaring and she is seeing GI soon. Diarrhea. 2-3 loose BM a day for 2-3 weeks. Feeling bloated.  Having CT of sinuses on 3/22. Throat tightening when she tries to talk. ENT, Dr. Jodi Mourning,   could tell that the opening to the esophagus is irritated. He increased her omeprazole 4 days ago, but she is not taking on an empty stomach. She has been treated for GERD in the past.    Depression screen [facility] 2/9 06/07/2019 04/26/2019 12/24/2018 12/17/2018 11/29/2018  Decreased Interest 1 0 0 0 0  Down, Depressed, Hopeless 1 0 0 0 0  PHQ - 2 Score 2 0 0 0 0  Altered sleeping 3 0 - 0 -  Tired, decreased energy 2 0 - 0 -  Change in appetite 2 0 - 0 -  Feeling bad or failure about yourself  2 0 - 0 -  Trouble concentrating 2 0 - 0 -  Moving slowly or fidgety/restless 2 0 - 0 -  Suicidal thoughts 0 0 - 0 -  PHQ-9 Score 15 0 - 0 -  Difficult doing work/chores Somewhat difficult - - - -  Some recent data might be hidden     Relevant past medical, surgical, family and social history reviewed and updated as indicated.  Interim medical history since our last visit reviewed. Allergies and medications reviewed and updated.  ROS:  Review of Systems  Constitutional: Negative.   HENT: Negative.   Eyes: Negative for visual disturbance.  Respiratory: Negative for shortness of breath.   Cardiovascular: Negative for chest pain.  Gastrointestinal: Negative for abdominal pain.  Musculoskeletal: Negative for arthralgias.     Social History   Tobacco Use  Smoking Status Former Smoker  . Packs/day: 0.25  . Years: 2.00  . Pack years: 0.50  . Quit date: 07/28/2000  . Years since quitting: 19.2  Smokeless Tobacco Never Used         Objective:     Wt Readings from Last 3 Encounters:  06/07/19 231 lb 12.8 oz (105.1 kg)  04/26/19 237 lb (107.5 kg)  12/24/18 230 lb (104.3 kg)     Exam deferred. Pt. Harboring due to COVID 19. Phone visit performed.   Assessment & Plan:   1. Gastroesophageal reflux disease with esophagitis without hemorrhage   2. Bronchitis with asthma, subacute   3. Sinobronchitis     Meds ordered this encounter  Medications  . omeprazole (PRILOSEC) 40 MG capsule    Sig: Take 1 capsule (40 mg total) by mouth daily.    Dispense:  60 capsule    Refill:  2    No orders of the defined types were placed in this encounter.     Diagnoses and all orders for this visit:  Gastroesophageal reflux disease with esophagitis without hemorrhage -     omeprazole (PRILOSEC) 40 MG capsule; Take 1 capsule (40 mg total) by mouth daily.  Bronchitis with asthma, subacute  Sinobronchitis    Virtual Visit via telephone Note  I discussed the limitations, risks, security and privacy concerns of performing an evaluation and management service by telephone and the availability of in person appointments. The patient  was identified with two identifiers. Pt.expressed understanding and agreed to proceed. Pt. Is at home. Dr. Livia Snellen is in his office.  Follow Up Instructions:   I discussed the assessment and treatment plan with the patient. The patient was provided an opportunity to ask questions and all were answered. The patient agreed with the plan and demonstrated an understanding of the instructions.   The patient was advised to call back or seek an in-person evaluation if the symptoms worsen or if the condition fails to improve as anticipated.   Total minutes including chart review and phone contact time: 21   Follow up plan: No follow-ups on file.  Claretta Fraise, MD [place]

## 2019-10-17 ENCOUNTER — Other Ambulatory Visit: Payer: Self-pay | Admitting: *Deleted

## 2019-10-17 ENCOUNTER — Encounter: Payer: Self-pay | Admitting: Family Medicine

## 2019-10-17 ENCOUNTER — Telehealth: Payer: Self-pay | Admitting: Family Medicine

## 2019-10-17 MED ORDER — DICYCLOMINE HCL 20 MG PO TABS: 20.0000 mg | ORAL_TABLET | Freq: Three times a day (TID) | ORAL | 1 refills | Status: DC | PRN

## 2019-10-17 NOTE — Telephone Encounter (Signed)
I wrote a note. It should be in MyChart or she can pick it up. Thanks, WS

## 2019-10-17 NOTE — Telephone Encounter (Signed)
Patient seen Dr. Livia Snellen- please advise and send back to pools

## 2019-10-18 NOTE — Telephone Encounter (Signed)
Patient aware.

## 2019-10-19 ENCOUNTER — Encounter: Payer: Self-pay | Admitting: Family Medicine

## 2019-10-19 ENCOUNTER — Telehealth (INDEPENDENT_AMBULATORY_CARE_PROVIDER_SITE_OTHER): Payer: 59 | Admitting: Family Medicine

## 2019-10-19 ENCOUNTER — Telehealth: Payer: Self-pay | Admitting: Physician Assistant

## 2019-10-19 DIAGNOSIS — A09 Infectious gastroenteritis and colitis, unspecified: Secondary | ICD-10-CM

## 2019-10-19 NOTE — Progress Notes (Signed)
Virtual Visit via telephone Note Due to COVID-19 pandemic this visit was conducted virtually. This visit type was conducted due to national recommendations for restrictions regarding the COVID-19 Pandemic (e.g. social distancing, sheltering in place) in an effort to limit this patient's exposure and mitigate transmission in our community. All issues noted in this document were discussed and addressed.  A physical exam was not performed with this format.   I connected with Deanna [name] on 10/19/2019 at 1730 by telephone and verified that I am speaking with the correct person using two identifiers. Deanna [name] is currently located at home and no one is currently with them during visit. The provider, Monia Pouch, FNP is located in their office at time of visit.  I discussed the limitations, risks, security and privacy concerns of performing an evaluation and management service by telephone and the availability of in person appointments. I also discussed with the patient that there may be a patient responsible charge related to this service. The patient expressed understanding and agreed to proceed.  Subjective:  Patient ID: Deanna [name], female    DOB: January 08, 1961, 59 y.o.   MRN: [id number]  Chief Complaint:  Diarrhea   HPI: Deanna [name] is a 59 y.o. female presenting on 10/19/2019 for Diarrhea   Pt reports she has C Diff. Was seen by GI yesterday and diagnosed. States she was placed on antibiotics but still has diarrhea. Pt has only taken two doses of antibiotics. Pt states she is unable to work due to the diarrhea and needs a work excuse.   Diarrhea  This is a new problem. The current episode started 1 to 4 weeks ago. The problem occurs 5 to 10 times per day. The problem has been waxing and waning. The stool consistency is described as watery. The patient states that diarrhea awakens her from sleep. Associated symptoms include abdominal pain and increased flatus. Pertinent negatives  include no arthralgias, chills, coughing, fever, headaches, myalgias or vomiting. Nothing aggravates the symptoms.     Relevant past medical, surgical, family, and social history reviewed and updated as indicated.  Allergies and medications reviewed and updated.   Past Medical History:  Diagnosis Date  . Cancer [facility])    breast  . Diverticulosis of sigmoid colon 07/14/2011  . GERD (gastroesophageal reflux disease)   . Migraine   . PONV (postoperative nausea and vomiting)   . Tubular adenoma of colon     Past Surgical History:  Procedure Laterality Date  . ABDOMINAL HYSTERECTOMY  02/2010   partial  . BREAST REDUCTION SURGERY  02/2011  . BREAST SURGERY     cancer  . CHOLECYSTECTOMY  07/17/11   w/IOC  . COLONOSCOPY    . NASAL SINUS SURGERY  02/2011  . TONSILLECTOMY  age 54    Social History   Socioeconomic History  . Marital status: Divorced    Spouse name: Not on file  . Number of children: 1  . Years of education: Not on file  . Highest education level: Not on file  Occupational History  . 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[employer]  Tobacco Use  . Smoking status: Former Smoker    Packs/day: 0.25    Years: 2.00    Pack years: 0.50    Quit date: 07/28/2000    Years since quitting: 19.2  . Smokeless tobacco: Never Used  Substance and Sexual Activity  . Alcohol use: No  . Drug use: No  . Sexual activity: Not on file  Other Topics Concern  . Not on file  Social History Narrative  . Not on file   Social Determinants of Health   Financial Resource Strain:   . Difficulty of Paying Living Expenses:   Food Insecurity:   . Worried About Charity fundraiser in the Last Year:   . Arboriculturist in the Last Year:   Transportation Needs:   . Film/video editor (Medical):   Deanna [name] Lack of Transportation (Non-Medical):   Physical Activity:   . Days of Exercise per Week:   . Minutes of Exercise per Session:   Stress:   . Feeling of Stress :   Social Connections:   .  Frequency of Communication with Friends and Family:   . Frequency of Social Gatherings with Friends and Family:   . Attends Religious Services:   . Active Member of Clubs or Organizations:   . Attends Archivist Meetings:   Deanna [name] Marital Status:   Intimate Partner Violence:   . Fear of Current or Ex-Partner:   . Emotionally Abused:   Deanna [name] Physically Abused:   . Sexually Abused:     Outpatient Encounter Medications as of 10/19/2019  Medication Sig  . ALPRAZolam (XANAX) 1 MG tablet Take 1 tablet (1 mg total) by mouth 2 (two) times daily as needed for anxiety.  Deanna [name] amLODipine (NORVASC) 5 MG tablet TAKE 1 TABLET ONCE DAILY.  Deanna [name] amoxicillin (AMOXIL) 500 MG capsule Take 1 capsule (500 mg total) by mouth 3 (three) times daily.  Deanna [name] amphetamine-dextroamphetamine (ADDERALL XR) 15 MG 24 hr capsule Take 1 capsule by mouth every morning.  . ARIPiprazole (ABILIFY) 5 MG tablet Take 1 tablet (5 mg total) by mouth daily.  . benzonatate (TESSALON) 200 MG capsule Take 1 capsule (200 mg total) by mouth 3 (three) times daily as needed.  Deanna [name] buPROPion (WELLBUTRIN XL) 150 MG 24 hr tablet Take 3 tablets (450 mg total) by mouth daily.  Deanna [name] CALCIUM-VITAMIN D PO Take 1 tablet by mouth. 600 mg of calcium  . clotrimazole (MYCELEX) 10 MG troche Allow one to dissolve in the mouth 5 times daily For yeast  . dicyclomine (BENTYL) 20 MG tablet Take 1 tablet (20 mg total) by mouth 3 (three) times daily as needed for spasms.  . fluconazole (DIFLUCAN) 150 MG tablet 1 po q week x 4 weeks  . fluconazole (DIFLUCAN) 150 MG tablet 1 po q week x 4 weeks  . fluticasone (FLONASE) 50 MCG/ACT nasal spray Place 2 sprays into both nostrils daily.  . Fluticasone-Salmeterol (ADVAIR DISKUS) 250-50 MCG/DOSE AEPB Inhale 1 puff into the lungs 2 (two) times daily.  . furosemide (LASIX) 20 MG tablet Take 1 tablet (20 mg total) by mouth daily. (Needs to be seen before next refill)  . Lysine 500 MG CAPS Take 500 mg by mouth daily.  . montelukast  (SINGULAIR) 10 MG tablet TAKE 1 TABLET ONCE DAILY IN THE EVENING.  Deanna [name] olmesartan (BENICAR) 40 MG tablet Take 1 tablet (40 mg total) by mouth every morning. (Needs to be seen before next refill)  . omeprazole (PRILOSEC) 40 MG capsule Take 1 capsule (40 mg total) by mouth daily.  . predniSONE (DELTASONE) 10 MG tablet Take 1 tablet (10 mg total) by mouth daily with breakfast.  . Probiotic Product (MEGA PROBIOTIC PO) Take 1 capsule by mouth daily.  . promethazine-dextromethorphan (PROMETHAZINE-DM) 6.25-15 MG/5ML syrup Take 5 mLs by mouth 3 (three) times daily as needed for cough.  . SUMAtriptan (IMITREX) 100 MG tablet  TAKE 1 TABLET ONCE DAILY AS NEEDED FOR MIGRAINE AS DIRECTED BY PHYSICIAN.  . valACYclovir (VALTREX) 1000 MG tablet TAKE  (1)  TABLET  EVERY TWELVE HOURS.  . VENTOLIN HFA 108 (90 Base) MCG/ACT inhaler Inhale 2 puffs into the lungs every 6 (six) hours as needed for wheezing or shortness of breath.   No facility-administered encounter medications on file as of 10/19/2019.    Allergies  Allergen Reactions  . Iohexol Anaphylaxis    IBD dye  . Clindamycin/Lincomycin Nausea And Vomiting    Review of Systems  Constitutional: Negative for activity change, appetite change, chills, fatigue and fever.  HENT: Negative.   Eyes: Negative.   Respiratory: Negative for cough, chest tightness and shortness of breath.   Cardiovascular: Negative for chest pain, palpitations and leg swelling.  Gastrointestinal: Positive for abdominal pain, diarrhea and flatus. Negative for blood in stool, constipation, nausea and vomiting.  Endocrine: Negative.   Genitourinary: Negative for decreased urine volume, difficulty urinating, dysuria, frequency and urgency.  Musculoskeletal: Negative for arthralgias and myalgias.  Skin: Negative.   Allergic/Immunologic: Negative.   Neurological: Negative for dizziness and headaches.  Hematological: Negative.   Psychiatric/Behavioral: Negative for confusion,  hallucinations, sleep disturbance and suicidal ideas.  All other systems reviewed and are negative.        Observations/Objective: No vital signs or physical exam, this was a telephone or virtual health encounter.  Pt alert and oriented, answers all questions appropriately, and able to speak in full sentences.    Assessment and Plan: Reighlyn was seen today for diarrhea.  Diagnoses and all orders for this visit:  Infectious diarrhea Currently on treatment for C Diff. Needs work excuse. Pt to keep follow up with GI on Friday. Work excuse provided.     Follow Up Instructions: Return if symptoms worsen or fail to improve.    I discussed the assessment and treatment plan with the patient. The patient was provided an opportunity to ask questions and all were answered. The patient agreed with the plan and demonstrated an understanding of the instructions.   The patient was advised to call back or seek an in-person evaluation if the symptoms worsen or if the condition fails to improve as anticipated.  The above assessment and management plan was discussed with the patient. The patient verbalized understanding of and has agreed to the management plan. Patient is aware to call the clinic if they develop any new symptoms or if symptoms persist or worsen. Patient is aware when to return to the clinic for a follow-up visit. Patient educated on when it is appropriate to go to the emergency department.    I provided 15 minutes of non-face-to-face time during this encounter. The call started at 1730. The call ended at 1745. The other time was used for coordination of care.    Monia Pouch, FNP-C [facility] [address] [place], [place] [address] [phone] 10/19/2019

## 2019-10-19 NOTE — Progress Notes (Deleted)
Subjective:  Patient ID: Deanna [name], female    DOB: 01-05-1961, 59 y.o.   MRN: [id number]  Patient Care Team: Theodoro Clock as PCP - General (Physician Assistant) Inda Castle, MD (Inactive) as Consulting Physician (Gastroenterology) Paula Compton, MD as Consulting Physician (Obstetrics and Gynecology)   Chief Complaint:  No chief complaint on file.   HPI: Deanna [name] is a 59 y.o. female presenting on 10/19/2019 for No chief complaint on file.   HPI There are no diagnoses linked to this encounter.    Relevant past medical, surgical, family, and social history reviewed and updated as indicated.  Allergies and medications reviewed and updated. Date reviewed: Chart in Epic.   Past Medical History:  Diagnosis Date  . Cancer [facility])    breast  . Diverticulosis of sigmoid colon 07/14/2011  . GERD (gastroesophageal reflux disease)   . Migraine   . PONV (postoperative nausea and vomiting)   . Tubular adenoma of colon     Past Surgical History:  Procedure Laterality Date  . ABDOMINAL HYSTERECTOMY  02/2010   partial  . BREAST REDUCTION SURGERY  02/2011  . BREAST SURGERY     cancer  . CHOLECYSTECTOMY  07/17/11   w/IOC  . COLONOSCOPY    . NASAL SINUS SURGERY  02/2011  . TONSILLECTOMY  age 69    Social History   Socioeconomic History  . Marital status: Divorced    Spouse name: Not on file  . Number of children: 1  . Years of education: Not on file  . Highest education level: Not on file  Occupational History  . 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[employer]  Tobacco Use  . Smoking status: Former Smoker    Packs/day: 0.25    Years: 2.00    Pack years: 0.50    Quit date: 07/28/2000    Years since quitting: 19.2  . Smokeless tobacco: Never Used  Substance and Sexual Activity  . Alcohol use: No  . Drug use: No  . Sexual activity: Not on file  Other Topics Concern  . Not on file  Social History Narrative  . Not on file   Social Determinants of  Health   Financial Resource Strain:   . Difficulty of Paying Living Expenses:   Food Insecurity:   . Worried About Charity fundraiser in the Last Year:   . Arboriculturist in the Last Year:   Transportation Needs:   . Film/video editor (Medical):   [name] Lack of Transportation (Non-Medical):   Physical Activity:   . Days of Exercise per Week:   . Minutes of Exercise per Session:   Stress:   . Feeling of Stress :   Social Connections:   . Frequency of Communication with Friends and Family:   . Frequency of Social Gatherings with Friends and Family:   . Attends Religious Services:   . Active Member of Clubs or Organizations:   . Attends Archivist Meetings:   [name] Marital Status:   Intimate Partner Violence:   . Fear of Current or Ex-Partner:   . Emotionally Abused:   [name] Physically Abused:   . Sexually Abused:     Outpatient Encounter Medications as of 10/19/2019  Medication Sig  . ALPRAZolam (XANAX) 1 MG tablet Take 1 tablet (1 mg total) by mouth 2 (two) times daily as needed for anxiety.  [name] amLODipine (NORVASC) 5 MG tablet TAKE 1 TABLET ONCE DAILY.  [name] amoxicillin (  AMOXIL) 500 MG capsule Take 1 capsule (500 mg total) by mouth 3 (three) times daily.  [name] amphetamine-dextroamphetamine (ADDERALL XR) 15 MG 24 hr capsule Take 1 capsule by mouth every morning.  . ARIPiprazole (ABILIFY) 5 MG tablet Take 1 tablet (5 mg total) by mouth daily.  . benzonatate (TESSALON) 200 MG capsule Take 1 capsule (200 mg total) by mouth 3 (three) times daily as needed.  [name] buPROPion (WELLBUTRIN XL) 150 MG 24 hr tablet Take 3 tablets (450 mg total) by mouth daily.  [name] CALCIUM-VITAMIN D PO Take 1 tablet by mouth. 600 mg of calcium  . clotrimazole (MYCELEX) 10 MG troche Allow one to dissolve in the mouth 5 times daily For yeast  . dicyclomine (BENTYL) 20 MG tablet Take 1 tablet (20 mg total) by mouth 3 (three) times daily as needed for spasms.  . fluconazole (DIFLUCAN) 150 MG tablet 1 po q week x 4  weeks  . fluconazole (DIFLUCAN) 150 MG tablet 1 po q week x 4 weeks  . fluticasone (FLONASE) 50 MCG/ACT nasal spray Place 2 sprays into both nostrils daily.  . Fluticasone-Salmeterol (ADVAIR DISKUS) 250-50 MCG/DOSE AEPB Inhale 1 puff into the lungs 2 (two) times daily.  . furosemide (LASIX) 20 MG tablet Take 1 tablet (20 mg total) by mouth daily. (Needs to be seen before next refill)  . Lysine 500 MG CAPS Take 500 mg by mouth daily.  . montelukast (SINGULAIR) 10 MG tablet TAKE 1 TABLET ONCE DAILY IN THE EVENING.  [name] olmesartan (BENICAR) 40 MG tablet Take 1 tablet (40 mg total) by mouth every morning. (Needs to be seen before next refill)  . omeprazole (PRILOSEC) 40 MG capsule Take 1 capsule (40 mg total) by mouth daily.  . predniSONE (DELTASONE) 10 MG tablet Take 1 tablet (10 mg total) by mouth daily with breakfast.  . Probiotic Product (MEGA PROBIOTIC PO) Take 1 capsule by mouth daily.  . promethazine-dextromethorphan (PROMETHAZINE-DM) 6.25-15 MG/5ML syrup Take 5 mLs by mouth 3 (three) times daily as needed for cough.  . SUMAtriptan (IMITREX) 100 MG tablet TAKE 1 TABLET ONCE DAILY AS NEEDED FOR MIGRAINE AS DIRECTED BY PHYSICIAN.  . valACYclovir (VALTREX) 1000 MG tablet TAKE  (1)  TABLET  EVERY TWELVE HOURS.  . VENTOLIN HFA 108 (90 Base) MCG/ACT inhaler Inhale 2 puffs into the lungs every 6 (six) hours as needed for wheezing or shortness of breath.   No facility-administered encounter medications on file as of 10/19/2019.    Allergies  Allergen Reactions  . Iohexol Anaphylaxis    IBD dye  . Clindamycin/Lincomycin Nausea And Vomiting    Review of Systems      Objective:  There were no vitals taken for this visit.   Wt Readings from Last 3 Encounters:  06/07/19 231 lb 12.8 oz (105.1 kg)  04/26/19 237 lb (107.5 kg)  12/24/18 230 lb (104.3 kg)    Physical Exam  Results for orders placed or performed in visit on 10/11/18  IgA  Result Value Ref Range   IgA 206 68 - 378 mg/dL    Tissue transglutaminase, IgA  Result Value Ref Range   (tTG) Ab, IgA 1 U/mL  Comprehensive metabolic panel  Result Value Ref Range   Sodium 139 135 - 145 mEq/L   Potassium 3.6 3.5 - 5.1 mEq/L   Chloride 105 96 - 112 mEq/L   CO2 25 19 - 32 mEq/L   Glucose, Bld 88 70 - 99 mg/dL   BUN 9 6 - 23 mg/dL  Creatinine, Ser 1.01 0.40 - 1.20 mg/dL   Total Bilirubin 0.4 0.2 - 1.2 mg/dL   Alkaline Phosphatase 79 39 - 117 U/L   AST 70 (H) 0 - 37 U/L   ALT 58 (H) 0 - 35 U/L   Total Protein 6.8 6.0 - 8.3 g/dL   Albumin 4.1 3.5 - 5.2 g/dL   Calcium 9.0 8.4 - 10.5 mg/dL   GFR 56.48 (L) >60.00 mL/min  CBC with Differential/Platelet  Result Value Ref Range   WBC 6.2 4.0 - 10.5 K/uL   RBC 4.92 3.87 - 5.11 Mil/uL   Hemoglobin 14.8 12.0 - 15.0 g/dL   HCT 43.7 36.0 - 46.0 %   MCV 88.8 78.0 - 100.0 fl   MCHC 34.0 30.0 - 36.0 g/dL   RDW 14.3 11.5 - 15.5 %   Platelets 320.0 150.0 - 400.0 K/uL   Neutrophils Relative % 55.8 43.0 - 77.0 %   Lymphocytes Relative 31.0 12.0 - 46.0 %   Monocytes Relative 10.2 3.0 - 12.0 %   Eosinophils Relative 2.2 0.0 - 5.0 %   Basophils Relative 0.8 0.0 - 3.0 %   Neutro Abs 3.5 1.4 - 7.7 K/uL   Lymphs Abs 1.9 0.7 - 4.0 K/uL   Monocytes Absolute 0.6 0.1 - 1.0 K/uL   Eosinophils Absolute 0.1 0.0 - 0.7 K/uL   Basophils Absolute 0.0 0.0 - 0.1 K/uL       Pertinent labs & imaging results that were available during my care of the patient were reviewed by me and considered in my medical decision making.  Assessment & Plan:  There are no diagnoses linked to this encounter.   Continue all other maintenance medications.  Follow up plan: No follow-ups on file.   Medical decision-making:   *** minutes spent today reviewing the medical chart, counseling the patient/family, and documenting today's visit.  Continue healthy lifestyle choices, including diet (rich in fruits, vegetables, and lean proteins, and low in salt and simple carbohydrates) and exercise (at least 30  minutes of moderate physical activity daily).  Educational handout given for ***  The above assessment and management plan was discussed with the patient. The patient verbalized understanding of and has agreed to the management plan. Patient is aware to call the clinic if they develop any new symptoms or if symptoms persist or worsen. Patient is aware when to return to the clinic for a follow-up visit. Patient educated on when it is appropriate to go to the emergency department.   Monia Pouch, FNP-C [facility] [phone]

## 2019-10-20 ENCOUNTER — Telehealth: Payer: Self-pay | Admitting: Physician Assistant

## 2019-10-20 ENCOUNTER — Encounter: Payer: Self-pay | Admitting: Family Medicine

## 2019-10-20 NOTE — Telephone Encounter (Signed)
Note sent

## 2019-11-01 ENCOUNTER — Telehealth: Payer: Self-pay | Admitting: Nurse Practitioner

## 2019-11-01 DIAGNOSIS — I10 Essential (primary) hypertension: Secondary | ICD-10-CM

## 2019-11-04 ENCOUNTER — Other Ambulatory Visit: Payer: 59

## 2019-11-04 ENCOUNTER — Other Ambulatory Visit: Payer: Self-pay

## 2019-11-04 DIAGNOSIS — I10 Essential (primary) hypertension: Secondary | ICD-10-CM

## 2019-11-05 LAB — CBC WITH DIFFERENTIAL/PLATELET
Basophils Absolute: 0.1 10*3/uL (ref 0.0–0.2)
Basos: 1 %
EOS (ABSOLUTE): 0.1 10*3/uL (ref 0.0–0.4)
Eos: 1 %
Hematocrit: 46.5 % (ref 34.0–46.6)
Hemoglobin: 15.7 g/dL (ref 11.1–15.9)
Immature Grans (Abs): 0 10*3/uL (ref 0.0–0.1)
Immature Granulocytes: 0 %
Lymphocytes Absolute: 2.6 10*3/uL (ref 0.7–3.1)
Lymphs: 36 %
MCH: 30.3 pg (ref 26.6–33.0)
MCHC: 33.8 g/dL (ref 31.5–35.7)
MCV: 90 fL (ref 79–97)
Monocytes Absolute: 0.8 10*3/uL (ref 0.1–0.9)
Monocytes: 11 %
Neutrophils Absolute: 3.5 10*3/uL (ref 1.4–7.0)
Neutrophils: 51 %
Platelets: 367 10*3/uL (ref 150–450)
RBC: 5.18 x10E6/uL (ref 3.77–5.28)
RDW: 13.2 % (ref 11.7–15.4)
WBC: 7.1 10*3/uL (ref 3.4–10.8)

## 2019-11-05 LAB — CMP14+EGFR
ALT: 38 IU/L — ABNORMAL HIGH (ref 0–32)
AST: 34 IU/L (ref 0–40)
Albumin/Globulin Ratio: 2.1 (ref 1.2–2.2)
Albumin: 4.4 g/dL (ref 3.8–4.9)
Alkaline Phosphatase: 103 IU/L (ref 39–117)
BUN/Creatinine Ratio: 14 (ref 9–23)
BUN: 14 mg/dL (ref 6–24)
Bilirubin Total: 0.4 mg/dL (ref 0.0–1.2)
CO2: 22 mmol/L (ref 20–29)
Calcium: 9.5 mg/dL (ref 8.7–10.2)
Chloride: 105 mmol/L (ref 96–106)
Creatinine, Ser: 1.01 mg/dL — ABNORMAL HIGH (ref 0.57–1.00)
GFR calc Af Amer: 71 mL/min/{1.73_m2} (ref >59)
GFR calc non Af Amer: 62 mL/min/{1.73_m2} (ref >59)
Globulin, Total: 2.1 g/dL (ref 1.5–4.5)
Glucose: 116 mg/dL — ABNORMAL HIGH (ref 65–99)
Potassium: 4.6 mmol/L (ref 3.5–5.2)
Sodium: 143 mmol/L (ref 134–144)
Total Protein: 6.5 g/dL (ref 6.0–8.5)

## 2019-11-05 LAB — LIPID PANEL
Chol/HDL Ratio: 4.1 ratio (ref 0.0–4.4)
Cholesterol, Total: 175 mg/dL (ref 100–199)
HDL: 43 mg/dL (ref >39)
LDL Chol Calc (NIH): 102 mg/dL — ABNORMAL HIGH (ref 0–99)
Triglycerides: 171 mg/dL — ABNORMAL HIGH (ref 0–149)
VLDL Cholesterol Cal: 30 mg/dL (ref 5–40)

## 2019-11-07 ENCOUNTER — Ambulatory Visit: Payer: 59 | Admitting: Nurse Practitioner

## 2019-11-07 ENCOUNTER — Other Ambulatory Visit: Payer: Self-pay

## 2019-11-07 ENCOUNTER — Encounter: Payer: Self-pay | Admitting: Nurse Practitioner

## 2019-11-07 VITALS — BP 133/81 | HR 87 | Temp 98.2°F | Resp 20 | Ht 64.0 in | Wt 252.0 lb

## 2019-11-07 DIAGNOSIS — F900 Attention-deficit hyperactivity disorder, predominantly inattentive type: Secondary | ICD-10-CM | POA: Diagnosis not present

## 2019-11-07 DIAGNOSIS — K219 Gastro-esophageal reflux disease without esophagitis: Secondary | ICD-10-CM

## 2019-11-07 DIAGNOSIS — F411 Generalized anxiety disorder: Secondary | ICD-10-CM

## 2019-11-07 DIAGNOSIS — K2100 Gastro-esophageal reflux disease with esophagitis, without bleeding: Secondary | ICD-10-CM

## 2019-11-07 DIAGNOSIS — I10 Essential (primary) hypertension: Secondary | ICD-10-CM

## 2019-11-07 DIAGNOSIS — F320 Major depressive disorder, single episode, mild: Secondary | ICD-10-CM

## 2019-11-07 DIAGNOSIS — J4521 Mild intermittent asthma with (acute) exacerbation: Secondary | ICD-10-CM

## 2019-11-07 DIAGNOSIS — K5730 Diverticulosis of large intestine without perforation or abscess without bleeding: Secondary | ICD-10-CM

## 2019-11-07 DIAGNOSIS — G43809 Other migraine, not intractable, without status migrainosus: Secondary | ICD-10-CM

## 2019-11-07 MED ORDER — OLMESARTAN MEDOXOMIL 40 MG PO TABS: 40.0000 mg | ORAL_TABLET | Freq: Every morning | ORAL | 11 refills | Status: DC

## 2019-11-07 MED ORDER — OMEPRAZOLE 40 MG PO CPDR: 40.0000 mg | DELAYED_RELEASE_CAPSULE | Freq: Every day | ORAL | 2 refills | Status: DC

## 2019-11-07 MED ORDER — FUROSEMIDE 20 MG PO TABS: 20.0000 mg | ORAL_TABLET | Freq: Every day | ORAL | 11 refills | Status: DC

## 2019-11-07 MED ORDER — AMLODIPINE BESYLATE 5 MG PO TABS: 5.0000 mg | ORAL_TABLET | Freq: Every day | ORAL | 1 refills | Status: DC

## 2019-11-07 MED ORDER — FLUTICASONE-SALMETEROL 250-50 MCG/DOSE IN AEPB: 1.0000 | INHALATION_SPRAY | Freq: Two times a day (BID) | RESPIRATORY_TRACT | 5 refills | Status: DC

## 2019-11-07 MED ORDER — ALPRAZOLAM 1 MG PO TABS: 1.0000 mg | ORAL_TABLET | Freq: Two times a day (BID) | ORAL | 2 refills | Status: DC | PRN

## 2019-11-07 MED ORDER — BUPROPION HCL ER (XL) 150 MG PO TB24: 450.0000 mg | ORAL_TABLET | Freq: Every day | ORAL | 11 refills | Status: DC

## 2019-11-07 NOTE — Patient Instructions (Signed)
Stress, Adult Stress is a normal reaction to life events. Stress is what you feel when life demands more than you are used to, or more than you think you can handle. Some stress can be useful, such as studying for a test or meeting a deadline at work. Stress that occurs too often or for too long can cause problems. It can affect your emotional health and interfere with relationships and normal daily activities. Too much stress can weaken your body's defense system (immune system) and increase your risk for physical illness. If you already have a medical problem, stress can make it worse. What are the causes? All sorts of life events can cause stress. An event that causes stress for one person may not be stressful for another person. Major life events, whether positive or negative, commonly cause stress. Examples include:  Losing a job or starting a new job.  Losing a loved one.  Moving to a new town or home.  Getting married or divorced.  Having a baby.  Getting injured or sick. Less obvious life events can also cause stress, especially if they occur day after day or in combination with each other. Examples include:  Working long hours.  Driving in traffic.  Caring for children.  Being in debt.  Being in a difficult relationship. What are the signs or symptoms? Stress can cause emotional symptoms, including:  Anxiety. This is feeling worried, afraid, on edge, overwhelmed, or out of control.  Anger, including irritation or impatience.  Depression. This is feeling sad, down, helpless, or guilty.  Trouble focusing, remembering, or making decisions. Stress can cause physical symptoms, including:  Aches and pains. These may affect your head, neck, back, stomach, or other areas of your body.  Tight muscles or a clenched jaw.  Low energy.  Trouble sleeping. Stress can cause unhealthy behaviors, including:  Eating to feel better (overeating) or skipping meals.  Working too  much or putting off tasks.  Smoking, drinking alcohol, or using drugs to feel better. How is this diagnosed? Stress is diagnosed through an assessment by your health care provider. He or she may diagnose this condition based on:  Your symptoms and any stressful life events.  Your medical history.  Tests to rule out other causes of your symptoms. Depending on your condition, your health care provider may refer you to a specialist for further evaluation. How is this treated?  Stress management techniques are the recommended treatment for stress. Medicine is not typically recommended for the treatment of stress. Techniques to reduce your reaction to stressful life events include:  Stress identification. Monitor yourself for symptoms of stress and identify what causes stress for you. These skills may help you to avoid or prepare for stressful events.  Time management. Set your priorities, keep a calendar of events, and learn to say no. Taking these actions can help you avoid making too many commitments. Techniques for coping with stress include:  Rethinking the problem. Try to think realistically about stressful events rather than ignoring them or overreacting. Try to find the positives in a stressful situation rather than focusing on the negatives.  Exercise. Physical exercise can release both physical and emotional tension. The key is to find a form of exercise that you enjoy and do it regularly.  Relaxation techniques. These relax the body and mind. The key is to find one or more that you enjoy and use the techniques regularly. Examples include: ? Meditation, deep breathing, or progressive relaxation techniques. ? Yoga or   tai chi. ? Biofeedback, mindfulness techniques, or journaling. ? Listening to music, being out in nature, or participating in other hobbies.  Practicing a healthy lifestyle. Eat a balanced diet, drink plenty of water, limit or avoid caffeine, and get plenty of  sleep.  Having a strong support network. Spend time with family, friends, or other people you enjoy being around. Express your feelings and talk things over with someone you trust. Counseling or talk therapy with a mental health professional may be helpful if you are having trouble managing stress on your own. Follow these instructions at home: Lifestyle   Avoid drugs.  Do not use any products that contain nicotine or tobacco, such as cigarettes, e-cigarettes, and chewing tobacco. If you need help quitting, ask your health care provider.  Limit alcohol intake to no more than 1 drink a day for nonpregnant women and 2 drinks a day for men. One drink equals 12 oz of beer, 5 oz of wine, or 1 oz of hard liquor  Do not use alcohol or drugs to relax.  Eat a balanced diet that includes fresh fruits and vegetables, whole grains, lean meats, fish, eggs, and beans, and low-fat dairy. Avoid processed foods and foods high in added fat, sugar, and salt.  Exercise at least 30 minutes on 5 or more days each week.  Get 7-8 hours of sleep each night. General instructions   Practice stress management techniques as discussed with your health care provider.  Drink enough fluid to keep your urine clear or pale yellow.  Take over-the-counter and prescription medicines only as told by your health care provider.  Keep all follow-up visits as told by your health care provider. This is important. Contact a health care provider if:  Your symptoms get worse.  You have new symptoms.  You feel overwhelmed by your problems and can no longer manage them on your own. Get help right away if:  You have thoughts of hurting yourself or others. If you ever feel like you may hurt yourself or others, or have thoughts about taking your own life, get help right away. You can go to your nearest emergency department or call:  Your local emergency services (911 in the [place]  A suicide crisis helpline, such as the  [place] at (316) 250-6172. This is open 24 hours a day. Summary  Stress is a normal reaction to life events. It can cause problems if it happens too often or for too long.  Practicing stress management techniques is the best way to treat stress.  Counseling or talk therapy with a mental health professional may be helpful if you are having trouble managing stress on your own. This information is not intended to replace advice given to you by your health care provider. Make sure you discuss any questions you have with your health care provider. Document Revised: 02/11/2019 Document Reviewed: 09/03/2016 Elsevier Patient Education  [place].

## 2019-11-07 NOTE — Progress Notes (Addendum)
Subjective:    Patient ID: Deanna [name], female    DOB: April 11, 1961, 59 y.o.   MRN: [id number]   Chief Complaint: Medical Management of Chronic Issues    HPI:  1. Essential hypertension, benign No c/o chest , or headache. Does not check blood pressure at home. BP Readings from Last 3 Encounters:  11/07/19 133/81  06/07/19 130/84  04/26/19 (!) 156/95     2. Mild intermittent asthma with acute exacerbation Has SOb. Has had for the last 12 weeks. She has seen ENT and has had CT scan which was negative. They said they were going to send her to throat specialist. She says her voice has been hoarse along with SOB. She uses advair everyday but has not needed albuterol.   3. Gastroesophageal reflux disease without esophagitis Is on omeprazole 40mg  BID. Still has indigestion.   4. Attention deficit hyperactivity disorder (ADHD), predominantly inattentive type She is not taking adderall. She does not feel  Like she needs it because she has not been working for the last 12 weeks.  5. Depression, major, single episode, mild ([place]) Os currently on wellbutri. She says she is a lttle depressed right now because eof her breathing issues. Depression screen [facility] 2/9 11/07/2019 06/07/2019 04/26/2019  Decreased Interest 0 1 0  Down, Depressed, Hopeless 0 1 0  PHQ - 2 Score 0 2 0  Altered sleeping - 3 0  Tired, decreased energy - 2 0  Change in appetite - 2 0  Feeling bad or failure about yourself  - 2 0  Trouble concentrating - 2 0  Moving slowly or fidgety/restless - 2 0  Suicidal thoughts - 0 0  PHQ-9 Score - 15 0  Difficult doing work/chores - Somewhat difficult -  Some recent data might be hidden     6. GAD (generalized anxiety disorder) Has anxiety mainly over her health issues. She is on xanax1mg  BID. GAD 7 : Generalized Anxiety Score 11/07/2019 06/07/2019  Nervous, Anxious, on Edge 1 0  Control/stop worrying 1 0  Worry too much - different things 1 1  Trouble relaxing 0 2    Restless 0 0  Easily annoyed or irritable 1 2  Afraid - awful might happen 0 0  Total GAD 7 Score 4 5  Anxiety Difficulty - Somewhat difficult      7. Diverticulosis of sigmoid colon Had a flare up 2 months ago but is doing better.  8. Other migraine without status migrainosus, not intractable Has not needed imitrex bue 1 x in the last month. That seems to be doing well right now.  9. Morbid obesity Weight is up 20lbs since February Wt Readings from Last 3 Encounters:  11/07/19 252 lb (114.3 kg)  06/07/19 231 lb 12.8 oz (105.1 kg)  04/26/19 237 lb (107.5 kg)   BMI Readings from Last 3 Encounters:  11/07/19 43.26 kg/m  06/07/19 39.79 kg/m  04/26/19 40.68 kg/m       Outpatient Encounter Medications as of 11/07/2019  Medication Sig  . ALPRAZolam (XANAX) 1 MG tablet Take 1 tablet (1 mg total) by mouth 2 (two) times daily as needed for anxiety.  [name] amLODipine (NORVASC) 5 MG tablet TAKE 1 TABLET ONCE DAILY.  . baclofen (LIORESAL) 10 MG tablet 1 p.o. before supper and before bed  . buPROPion (WELLBUTRIN XL) 150 MG 24 hr tablet Take 3 tablets (450 mg total) by mouth daily.  [name] CALCIUM-VITAMIN D PO Take 1 tablet by mouth. 600 mg of calcium  .  clotrimazole (MYCELEX) 10 MG troche Allow one to dissolve in the mouth 5 times daily For yeast  . fluticasone (FLONASE) 50 MCG/ACT nasal spray Place 2 sprays into both nostrils daily.  . Fluticasone-Salmeterol (ADVAIR DISKUS) 250-50 MCG/DOSE AEPB Inhale 1 puff into the lungs 2 (two) times daily.  . furosemide (LASIX) 20 MG tablet Take 1 tablet (20 mg total) by mouth daily. (Needs to be seen before next refill)  . montelukast (SINGULAIR) 10 MG tablet TAKE 1 TABLET ONCE DAILY IN THE EVENING.  [name] olmesartan (BENICAR) 40 MG tablet Take 1 tablet (40 mg total) by mouth every morning. (Needs to be seen before next refill)  . omeprazole (PRILOSEC) 40 MG capsule Take 1 capsule (40 mg total) by mouth daily.  . Probiotic Product (MEGA PROBIOTIC PO)  Take 1 capsule by mouth daily.  . SUMAtriptan (IMITREX) 100 MG tablet TAKE 1 TABLET ONCE DAILY AS NEEDED FOR MIGRAINE AS DIRECTED BY PHYSICIAN.  . valACYclovir (VALTREX) 1000 MG tablet TAKE  (1)  TABLET  EVERY TWELVE HOURS.  . VENTOLIN HFA 108 (90 Base) MCG/ACT inhaler Inhale 2 puffs into the lungs every 6 (six) hours as needed for wheezing or shortness of breath.  . amphetamine-dextroamphetamine (ADDERALL XR) 15 MG 24 hr capsule Take 1 capsule by mouth every morning. (Patient not taking: Reported on 11/07/2019)    Past Surgical History:  Procedure Laterality Date  . ABDOMINAL HYSTERECTOMY  02/2010   partial  . BREAST REDUCTION SURGERY  02/2011  . BREAST SURGERY     cancer  . CHOLECYSTECTOMY  07/17/11   w/IOC  . COLONOSCOPY    . NASAL SINUS SURGERY  02/2011  . TONSILLECTOMY  age 60    Family History  Problem Relation Age of Onset  . Mental illness Mother   . Stroke Mother        died with this at age 74  . Heart disease Mother   . Hyperlipidemia Mother   . Alcohol abuse Father   . Cancer Paternal Grandmother        colon  . Colon cancer Paternal Grandmother   . Alcohol abuse Brother   . Asthma Brother   . Hypertension Brother   . Esophageal cancer Neg Hx   . Rectal cancer Neg Hx   . Stomach cancer Neg Hx     New complaints: Nothing new other then what was started above about SOB.  Social history: Lives with her boyfriend. Has been out of work for over 12 weeks now.  Controlled substance contract: 11/07/19    Review of Systems  Constitutional: Negative for diaphoresis.  Eyes: Negative for pain.  Respiratory: Negative for shortness of breath.   Cardiovascular: Negative for chest pain, palpitations and leg swelling.  Gastrointestinal: Negative for abdominal pain.  Endocrine: Negative for polydipsia.  Skin: Negative for rash.  Neurological: Negative for dizziness, weakness and headaches.  Hematological: Does not bruise/bleed easily.  All other systems reviewed and  are negative.      Objective:   Physical Exam Vitals and nursing note reviewed.  Constitutional:      General: She is not in acute distress.    Appearance: Normal appearance. She is well-developed.  HENT:     Head: Normocephalic.     Nose: Nose normal.  Eyes:     Pupils: Pupils are equal, round, and reactive to light.  Neck:     Vascular: No carotid bruit or JVD.  Cardiovascular:     Rate and Rhythm: Normal rate and regular  rhythm.     Heart sounds: Normal heart sounds.  Pulmonary:     Effort: Pulmonary effort is normal. No respiratory distress.     Breath sounds: Normal breath sounds. No wheezing or rales.  Chest:     Chest wall: No tenderness.  Abdominal:     General: Bowel sounds are normal. There is no distension or abdominal bruit.     Palpations: Abdomen is soft. There is no hepatomegaly, splenomegaly, mass or pulsatile mass.     Tenderness: There is no abdominal tenderness.  Musculoskeletal:        General: Normal range of motion.     Cervical back: Normal range of motion and neck supple.  Lymphadenopathy:     Cervical: No cervical adenopathy.  Skin:    General: Skin is warm and dry.  Neurological:     Mental Status: She is alert and oriented to person, place, and time.     Deep Tendon Reflexes: Reflexes are normal and symmetric.  Psychiatric:        Behavior: Behavior normal.        Thought Content: Thought content normal.        Judgment: Judgment normal.     BP 133/81   Pulse 87   Temp 98.2 F (36.8 C) (Temporal)   Resp 20   Ht 5\' 4"  (1.626 m)   Wt 252 lb (114.3 kg)   SpO2 95%   BMI 43.26 kg/m        Assessment & Plan:  LEYLANNI BENEFIELD comes in today with chief complaint of Medical Management of Chronic Issues   Diagnosis and orders addressed:  1. Essential hypertension, benign Low sodium diet - amLODipine (NORVASC) 5 MG tablet; Take 1 tablet (5 mg total) by mouth daily.  Dispense: 90 tablet; Refill: 1 - olmesartan (BENICAR) 40 MG tablet;  Take 1 tablet (40 mg total) by mouth every morning. (Needs to be seen before next refill)  Dispense: 30 tablet; Refill: 11 - furosemide (LASIX) 20 MG tablet; Take 1 tablet (20 mg total) by mouth daily. (Needs to be seen before next refill)  Dispense: 30 tablet; Refill: 11  2. Mild intermittent asthma with acute exacerbation Referral to pulmonology Needs to see pulmonology prior to returning to work - Ambulatory referral to Pulmonology - Fluticasone-Salmeterol (ADVAIR DISKUS) 250-50 MCG/DOSE AEPB; Inhale 1 puff into the lungs 2 (two) times daily.  Dispense: 1 each; Refill: 5  3. Gastroesophageal reflux disease without esophagitis Avoid spicy foods Do not eat 2 hours prior to bedtime  4. Attention deficit hyperactivity disorder (ADHD), predominantly inattentive type Not going to rx adderall for now Discussed not safe to use adderall and xanax togather  5. Depression, major, single episode, mild (HCC) Stress management - buPROPion (WELLBUTRIN XL) 150 MG 24 hr tablet; Take 3 tablets (450 mg total) by mouth daily.  Dispense: 90 tablet; Refill: 11  6. GAD (generalized anxiety disorder) - ALPRAZolam (XANAX) 1 MG tablet; Take 1 tablet (1 mg total) by mouth 2 (two) times daily as needed for anxiety.  Dispense: 60 tablet; Refill: 2  7. Diverticulosis of sigmoid colon Watch diet to prevent flare up  8. Other migraine without status migrainosus, not intractable Keep diary of migraine occurrence Avoid caffeine  9. Gastroesophageal reflux disease with esophagitis without hemorrhage Avoid spicy foods Do not eat 2 hours prior to bedtime - omeprazole (PRILOSEC) 40 MG capsule; Take 1 capsule (40 mg total) by mouth daily.  Dispense: 60 capsule; Refill: 2   Lab  results reviewed with patient Health Maintenance reviewed Diet and exercise encouraged  Follow up plan: 6 months   Powers, FNP

## 2019-11-16 ENCOUNTER — Telehealth: Payer: Self-pay | Admitting: Nurse Practitioner

## 2019-11-17 NOTE — Telephone Encounter (Signed)
Patient has appt with pulmonologist on 11/28/19 ppw

## 2019-11-18 NOTE — Telephone Encounter (Signed)
[facility] having an issue printing OV for 4/12 which is being requested, will fax other correspondence with note that records are to follow

## 2019-11-28 ENCOUNTER — Ambulatory Visit (INDEPENDENT_AMBULATORY_CARE_PROVIDER_SITE_OTHER): Payer: 59

## 2019-11-28 ENCOUNTER — Other Ambulatory Visit (INDEPENDENT_AMBULATORY_CARE_PROVIDER_SITE_OTHER): Payer: 59

## 2019-11-28 ENCOUNTER — Encounter: Payer: Self-pay | Admitting: Pulmonary Disease

## 2019-11-28 ENCOUNTER — Other Ambulatory Visit: Payer: Self-pay

## 2019-11-28 ENCOUNTER — Ambulatory Visit: Payer: 59 | Admitting: Pulmonary Disease

## 2019-11-28 VITALS — BP 144/86 | HR 85 | Temp 97.2°F | Ht 65.0 in | Wt 256.2 lb

## 2019-11-28 DIAGNOSIS — R635 Abnormal weight gain: Secondary | ICD-10-CM

## 2019-11-28 DIAGNOSIS — R0602 Shortness of breath: Secondary | ICD-10-CM

## 2019-11-28 LAB — T3, FREE: T3, Free: 3.8 pg/mL (ref 2.3–4.2)

## 2019-11-28 LAB — TSH: TSH: 3.89 u[IU]/mL (ref 0.35–4.50)

## 2019-11-28 LAB — T4, FREE: Free T4: 0.84 ng/dL (ref 0.60–1.60)

## 2019-11-28 NOTE — Patient Instructions (Addendum)
Shortness of breath on exertion  Obtain echocardiogram Pulmonary function tests Thyroid function test Chest x-ray  Graded exercises as tolerated  Keep reflux symptoms as controlled as best as you can Behavioral modifications to reduce reflux  Continue inhalers, we may be able to stop them once you are feeling better  Call with significant concerns  We will update you as results become available  Follow-up in 6 weeks

## 2019-11-28 NOTE — Progress Notes (Signed)
Deanna [name]    [id number]    06-Mar-1961  Primary Care Physician:Bost, Mary-Margaret, FNP  Referring Physician: Chevis Pretty, [address] [place],  [place] [address]  Chief complaint:   Patient is being seen for shortness of breath  HPI:  Did have a bronchitis in February and just never recovered fully She has been 2 rounds of antibiotics and steroids She usually will have a bronchitis about twice a year but with antibiotics and steroids would usually be able to get rid of symptoms within a few days Recently been diagnosed with reflux, large hiatal hernia Diverticulitis  Some degree of anxiety also contributing to symptoms  She is short of breath with mild exertion and sometimes just taking a shower get her short of breath  She has been started on inhalers also for treatment for reflux and in the last couple of weeks she may be feeling a little bit better  Denies any significant chest pains or chest discomfort  Did not grow up with asthma or any other breathing problems She did smoke for couple years-social smoking quit in 1999  No pertinent occupational history  Has gained about 50 pounds in the last year Gained about 25 pounds since she got sick in February  Outpatient Encounter Medications as of 11/28/2019  Medication Sig  . ALPRAZolam (XANAX) 1 MG tablet Take 1 tablet (1 mg total) by mouth 2 (two) times daily as needed for anxiety.  [name] amLODipine (NORVASC) 5 MG tablet Take 1 tablet (5 mg total) by mouth daily.  . baclofen (LIORESAL) 10 MG tablet 1 p.o. before supper and before bed  . buPROPion (WELLBUTRIN XL) 150 MG 24 hr tablet Take 3 tablets (450 mg total) by mouth daily.  [name] CALCIUM-VITAMIN D PO Take 1 tablet by mouth. 600 mg of calcium  . fluticasone (FLONASE) 50 MCG/ACT nasal spray Place 2 sprays into both nostrils daily.  . Fluticasone-Salmeterol (ADVAIR DISKUS) 250-50 MCG/DOSE AEPB Inhale 1 puff into the lungs 2 (two) times daily.    . furosemide (LASIX) 20 MG tablet Take 1 tablet (20 mg total) by mouth daily. (Needs to be seen before next refill)  . montelukast (SINGULAIR) 10 MG tablet TAKE 1 TABLET ONCE DAILY IN THE EVENING.  [name] olmesartan (BENICAR) 40 MG tablet Take 1 tablet (40 mg total) by mouth every morning. (Needs to be seen before next refill)  . omeprazole (PRILOSEC) 40 MG capsule Take 1 capsule (40 mg total) by mouth daily.  . Probiotic Product (MEGA PROBIOTIC PO) Take 1 capsule by mouth daily.  . SUMAtriptan (IMITREX) 100 MG tablet TAKE 1 TABLET ONCE DAILY AS NEEDED FOR MIGRAINE AS DIRECTED BY PHYSICIAN.  . valACYclovir (VALTREX) 1000 MG tablet TAKE  (1)  TABLET  EVERY TWELVE HOURS.  . VENTOLIN HFA 108 (90 Base) MCG/ACT inhaler Inhale 2 puffs into the lungs every 6 (six) hours as needed for wheezing or shortness of breath.  . clotrimazole (MYCELEX) 10 MG troche Allow one to dissolve in the mouth 5 times daily For yeast   No facility-administered encounter medications on file as of 11/28/2019.    Allergies as of 11/28/2019 - Review Complete 11/28/2019  Allergen Reaction Noted  . Iohexol Anaphylaxis 07/14/2011  . Clindamycin/lincomycin Nausea And Vomiting 12/28/2018    Past Medical History:  Diagnosis Date  . Cancer [facility])    breast  . Diverticulosis of sigmoid colon 07/14/2011  . GERD (gastroesophageal reflux disease)   . Migraine   .  PONV (postoperative nausea and vomiting)   . Tubular adenoma of colon     Past Surgical History:  Procedure Laterality Date  . ABDOMINAL HYSTERECTOMY  02/2010   partial  . BREAST REDUCTION SURGERY  02/2011  . BREAST SURGERY     cancer  . CHOLECYSTECTOMY  07/17/11   w/IOC  . COLONOSCOPY    . NASAL SINUS SURGERY  02/2011  . TONSILLECTOMY  age 52    Family History  Problem Relation Age of Onset  . Mental illness Mother   . Stroke Mother        died with this at age 10  . Heart disease Mother   . Hyperlipidemia Mother   . Alcohol abuse Father   . Cancer Paternal  Grandmother        colon  . Colon cancer Paternal Grandmother   . Alcohol abuse Brother   . Asthma Brother   . Hypertension Brother   . Esophageal cancer Neg Hx   . Rectal cancer Neg Hx   . Stomach cancer Neg Hx     Social History   Socioeconomic History  . Marital status: Divorced    Spouse name: Not on file  . Number of children: 1  . Years of education: Not on file  . Highest education level: Not on file  Occupational History  . 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[employer]  Tobacco Use  . Smoking status: Former Smoker    Packs/day: 0.25    Years: 2.00    Pack years: 0.50    Quit date: 07/28/2000    Years since quitting: 19.3  . Smokeless tobacco: Never Used  Substance and Sexual Activity  . Alcohol use: No  . Drug use: No  . Sexual activity: Not on file  Other Topics Concern  . Not on file  Social History Narrative  . Not on file   Social Determinants of Health   Financial Resource Strain:   . Difficulty of Paying Living Expenses:   Food Insecurity:   . Worried About Charity fundraiser in the Last Year:   . Arboriculturist in the Last Year:   Transportation Needs:   . Film/video editor (Medical):   [name] Lack of Transportation (Non-Medical):   Physical Activity:   . Days of Exercise per Week:   . Minutes of Exercise per Session:   Stress:   . Feeling of Stress :   Social Connections:   . Frequency of Communication with Friends and Family:   . Frequency of Social Gatherings with Friends and Family:   . Attends Religious Services:   . Active Member of Clubs or Organizations:   . Attends Archivist Meetings:   [name] Marital Status:   Intimate Partner Violence:   . Fear of Current or Ex-Partner:   . Emotionally Abused:   [name] Physically Abused:   . Sexually Abused:     Review of Systems  Constitutional: Positive for fatigue.  Respiratory: Positive for shortness of breath. Negative for cough, chest tightness and wheezing.     Vitals:   11/28/19  1015  BP: (!) 144/86  Pulse: 85  Temp: (!) 97.2 F (36.2 C)  SpO2: 96%     Physical Exam  Constitutional: She is oriented to person, place, and time. She appears well-developed.  HENT:  Head: Normocephalic and atraumatic.  Eyes: Pupils are equal, round, and reactive to light. Right eye exhibits no discharge.  Neck: No tracheal deviation present. No thyromegaly  present.  Cardiovascular: Normal rate and regular rhythm.  Pulmonary/Chest: Effort normal and breath sounds normal. No respiratory distress. She has no wheezes. She has no rales. She exhibits no tenderness.  Abdominal: Soft.  Musculoskeletal:        General: No edema. Normal range of motion.     Cervical back: Normal range of motion and neck supple.  Neurological: She is alert and oriented to person, place, and time. No cranial nerve deficit.  Skin: Skin is warm and dry.  Psychiatric: She has a normal mood and affect.   Data Reviewed: Blood work from 11/04/2019-normal BMP, normal CBC TFT from 2020 was within normal limits Allergy tested from 2020 shows multiple food allergies  Assessment:  Shortness of breath on exertion  Obesity  Significant weight gain in the last year  Plan/Recommendations: Check thyroid function test Pulmonary function test Chest x-ray Echocardiogram for dyspnea on exertion  Patient denies symptoms of sleep disordered breathing  Graded exercise recommended  We will continue current inhalers which include Advair and albuterol as needed  Follow-up in 6 weeks  Encouraged to call with any significant concerns   Sherrilyn Rist MD [facility] 11/28/2019, 10:43 AM  CC: Chevis Pretty, *

## 2019-11-28 NOTE — Progress Notes (Signed)
p 

## 2019-11-29 ENCOUNTER — Ambulatory Visit (INDEPENDENT_AMBULATORY_CARE_PROVIDER_SITE_OTHER): Payer: 59 | Admitting: Nurse Practitioner

## 2019-11-29 ENCOUNTER — Encounter: Payer: Self-pay | Admitting: Nurse Practitioner

## 2019-11-29 DIAGNOSIS — R0602 Shortness of breath: Secondary | ICD-10-CM | POA: Diagnosis not present

## 2019-11-29 NOTE — Progress Notes (Signed)
   Virtual Visit via telephone Note Due to COVID-19 pandemic this visit was conducted virtually. This visit type was conducted due to national recommendations for restrictions regarding the COVID-19 Pandemic (e.g. social distancing, sheltering in place) in an effort to limit this patient's exposure and mitigate transmission in our community. All issues noted in this document were discussed and addressed.  A physical exam was not performed with this format.  I connected with Deanna [name] on 11/29/19 at 11:10 by telephone and verified that I am speaking with the correct person using two identifiers. Deanna [name] is currently located at home and no one is currently with her during visit. The provider, Mary-Margaret Hassell Done, FNP is located in their office at time of visit.  I discussed the limitations, risks, security and privacy concerns of performing an evaluation and management service by telephone and the availability of in person appointments. I also discussed with the patient that there may be a patient responsible charge related to this service. The patient expressed understanding and agreed to proceed.   History and Present Illness:  Patient has been out of work since December for sob. He went and saw pulmonology yesterday. He ordered TSH and he will do PFT on June 11 and he has also ordered an echocardiogram. He was not able to give her any cause of her SOB. He did want her to walk everyday and gradually increase distance. SHe currently does not know what to do about work. She says she I not able to work right now, because of the OB.   Review of Systems  Constitutional: Negative for diaphoresis and weight loss.  Eyes: Negative for blurred vision, double vision and pain.  Respiratory: Negative for shortness of breath.   Cardiovascular: Negative for chest pain, palpitations, orthopnea and leg swelling.  Gastrointestinal: Negative for abdominal pain.  Skin: Negative for rash.    Neurological: Negative for dizziness, sensory change, loss of consciousness, weakness and headaches.  Endo/Heme/Allergies: Negative for polydipsia. Does not bruise/bleed easily.  Psychiatric/Behavioral: Negative for memory loss. The patient does not have insomnia.   All other systems reviewed and are negative.    Observations/Objective: Alert and oriented- answers all questions appropriately No distress OB when speaking  Assessment and Plan: Deanna [name] in today with chief complaint of Shortness of Breath   1. SOB (shortness of breath) Continue inhalers GERD- meds- watch diet- do not eat 2 hours prior to bedtime.    Follow Up Instructions: 2 weeks    I discussed the assessment and treatment plan with the patient. The patient was provided an opportunity to ask questions and all were answered. The patient agreed with the plan and demonstrated an understanding of the instructions.   The patient was advised to call back or seek an in-person evaluation if the symptoms worsen or if the condition fails to improve as anticipated.  The above assessment and management plan was discussed with the patient. The patient verbalized understanding of and has agreed to the management plan. Patient is aware to call the clinic if symptoms persist or worsen. Patient is aware when to return to the clinic for a follow-up visit. Patient educated on when it is appropriate to go to the emergency department.   Time call ended:  11:27 I provided 17 minutes of non-face-to-face time during this encounter.    Mary-Margaret Hassell Done, FNP

## 2019-12-01 ENCOUNTER — Telehealth: Payer: Self-pay | Admitting: Pulmonary Disease

## 2019-12-01 NOTE — Telephone Encounter (Signed)
error 

## 2019-12-06 ENCOUNTER — Ambulatory Visit (HOSPITAL_COMMUNITY): Payer: 59 | Attending: Cardiovascular Disease

## 2019-12-06 ENCOUNTER — Other Ambulatory Visit: Payer: Self-pay

## 2019-12-06 DIAGNOSIS — R0602 Shortness of breath: Secondary | ICD-10-CM | POA: Diagnosis present

## 2019-12-19 ENCOUNTER — Encounter: Payer: Self-pay | Admitting: Nurse Practitioner

## 2019-12-19 ENCOUNTER — Other Ambulatory Visit: Payer: Self-pay

## 2019-12-19 ENCOUNTER — Ambulatory Visit (INDEPENDENT_AMBULATORY_CARE_PROVIDER_SITE_OTHER): Payer: 59 | Admitting: Nurse Practitioner

## 2019-12-19 VITALS — BP 145/91 | HR 83 | Temp 97.9°F | Resp 20 | Ht 65.0 in | Wt 253.0 lb

## 2019-12-19 DIAGNOSIS — R0602 Shortness of breath: Secondary | ICD-10-CM

## 2019-12-19 NOTE — Progress Notes (Signed)
Subjective:    Patient ID: Deanna [name], female    DOB: 07-26-1961, 59 y.o.   MRN: [id number]   Chief Complaint: discuss diability  HPI Patient come in with SOB. She ha been out of work since February. He started out with bronchitis in February and has been SOB since. She aw pulmonology May 3 and has PFT scheduled for June. She ha appointment with GI to check hiatal hernia, and is going to see [facility] to have them look at her throat.   Review of Systems  Constitutional: Negative for diaphoresis.  HENT: Positive for voice change (hoarse).   Eyes: Negative for pain.  Respiratory: Positive for shortness of breath and stridor.   Cardiovascular: Negative for chest pain, palpitations and leg swelling.  Gastrointestinal: Negative for abdominal pain.  Endocrine: Negative for polydipsia.  Skin: Negative for rash.  Neurological: Negative for dizziness, weakness and headaches.  Hematological: Does not bruise/bleed easily.  All other systems reviewed and are negative.      Objective:   Physical Exam Vitals and nursing note reviewed.  Constitutional:      General: She is not in acute distress.    Appearance: Normal appearance. She is well-developed.  HENT:     Head: Normocephalic.     Comments: Voice hoarse    Nose: Nose normal.  Eyes:     Pupils: Pupils are equal, round, and reactive to light.  Neck:     Vascular: No carotid bruit or JVD.  Cardiovascular:     Rate and Rhythm: Normal rate and regular rhythm.     Heart sounds: Normal heart sounds.  Pulmonary:     Effort: Pulmonary effort is normal. No respiratory distress.     Breath sounds: Normal breath sounds. No wheezing or rales.  Chest:     Chest wall: No tenderness.  Abdominal:     General: Bowel sounds are normal. There is no distension or abdominal bruit.     Palpations: Abdomen is soft. There is no hepatomegaly, splenomegaly, mass or pulsatile mass.     Tenderness: There is no abdominal tenderness.  Musculoskeletal:         General: Normal range of motion.     Cervical back: Normal range of motion and neck supple.  Lymphadenopathy:     Cervical: No cervical adenopathy.  Skin:    General: Skin is warm and dry.  Neurological:     Mental Status: She is alert and oriented to person, place, and time.     Deep Tendon Reflexes: Reflexes are normal and symmetric.  Psychiatric:        Behavior: Behavior normal.        Thought Content: Thought content normal.        Judgment: Judgment normal.   Blood pressure (!) 145/91, pulse 83, temperature 97.9 F (36.6 C), temperature source Temporal, resp. rate 20, height 5\' 5"  (1.651 m), weight 253 lb (114.8 kg), SpO2 91 %.  O2 at @ at rest - 97% O2 at with exercise-96%       Assessment & Plan:  [name] in today with chief complaint of No chief complaint on file.   1. SOB (shortness of breath) Keep appointment for PFT and follow up with pulmonology Keep appointment with GI and ENT OUT of work till follow up with pulmonology   The above assessment and management plan was discussed with the patient. The patient verbalized understanding of and has agreed to the management plan. Patient is aware to  call the clinic if symptoms persist or worsen. Patient is aware when to return to the clinic for a follow-up visit. Patient educated on when it is appropriate to go to the emergency department.   Mary-Margaret Hassell Done, FNP

## 2019-12-19 NOTE — Patient Instructions (Signed)
Shortness of Breath, Adult Shortness of breath means you have trouble breathing. Shortness of breath could be a sign of a medical problem. Follow these instructions at home:   Watch for any changes in your symptoms.  Do not use any products that contain nicotine or tobacco, such as cigarettes, e-cigarettes, and chewing tobacco.  Do not smoke. Smoking can cause shortness of breath. If you need help to quit smoking, ask your doctor.  Avoid things that can make it harder to breathe, such as: ? Mold. ? Dust. ? Air pollution. ? Chemical smells. ? Things that can cause allergy symptoms (allergens), if you have allergies.  Keep your living space clean. Use products that help remove mold and dust.  Rest as needed. Slowly return to your normal activities.  Take over-the-counter and prescription medicines only as told by your doctor. This includes oxygen therapy and inhaled medicines.  Keep all follow-up visits as told by your doctor. This is important. Contact a doctor if:  Your condition does not get better as soon as expected.  You have a hard time doing your normal activities, even after you rest.  You have new symptoms. Get help right away if:  Your shortness of breath gets worse.  You have trouble breathing when you are resting.  You feel light-headed or you pass out (faint).  You have a cough that is not helped by medicines.  You cough up blood.  You have pain with breathing.  You have pain in your chest, arms, shoulders, or belly (abdomen).  You have a fever.  You cannot walk up stairs.  You cannot exercise the way you normally do. These symptoms may represent a serious problem that is an emergency. Do not wait to see if the symptoms will go away. Get medical help right away. Call your local emergency services (911 in the [place] Do not drive yourself to the hospital. Summary  Shortness of breath is when you have trouble breathing enough air. It can be a sign of a  medical problem.  Avoid things that make it hard for you to breathe, such as smoking, pollution, mold, and dust.  Watch for any changes in your symptoms. Contact your doctor if you do not get better or you get worse. This information is not intended to replace advice given to you by your health care provider. Make sure you discuss any questions you have with your health care provider. Document Revised: 12/14/2017 Document Reviewed: 12/14/2017 Elsevier Patient Education  2020 [employer]vier Inc.  

## 2019-12-23 ENCOUNTER — Telehealth: Payer: Self-pay | Admitting: Pulmonary Disease

## 2019-12-23 NOTE — Telephone Encounter (Signed)
Called and spoke with pt. Pt stated she needs to have her OV faxed over to [place] with [employer] at fax number [phone]. Pt's OV has been faxed. Nothing further needed.

## 2019-12-23 NOTE — Telephone Encounter (Signed)
Pt returning a phone call. Pt can be reached at [phone]

## 2019-12-23 NOTE — Telephone Encounter (Signed)
LMTCB for pt x 1  

## 2020-01-03 ENCOUNTER — Other Ambulatory Visit (HOSPITAL_COMMUNITY)
Admission: RE | Admit: 2020-01-03 | Discharge: 2020-01-03 | Disposition: A | Discharge status: Home / Self Care | Payer: 59 | Source: Ambulatory Visit | Attending: Pulmonary Disease | Admitting: Pulmonary Disease

## 2020-01-03 DIAGNOSIS — Z20822 Contact with and (suspected) exposure to covid-19: Secondary | ICD-10-CM | POA: Insufficient documentation

## 2020-01-03 DIAGNOSIS — Z01812 Encounter for preprocedural laboratory examination: Secondary | ICD-10-CM | POA: Diagnosis present

## 2020-01-03 LAB — SARS CORONAVIRUS 2 (TAT 6-24 HRS): SARS Coronavirus 2: NEGATIVE

## 2020-01-06 ENCOUNTER — Other Ambulatory Visit: Payer: Self-pay

## 2020-01-06 ENCOUNTER — Ambulatory Visit (INDEPENDENT_AMBULATORY_CARE_PROVIDER_SITE_OTHER): Payer: 59 | Admitting: Pulmonary Disease

## 2020-01-06 DIAGNOSIS — R0602 Shortness of breath: Secondary | ICD-10-CM | POA: Diagnosis not present

## 2020-01-06 LAB — PULMONARY FUNCTION TEST
DL/VA % pred: 112 %
DL/VA: 4.76 ml/min/mmHg/L
DLCO cor % pred: 126 %
DLCO cor: 25.86 ml/min/mmHg
DLCO unc % pred: 126 %
DLCO unc: 25.86 ml/min/mmHg
FEF 25-75 Post: 4.48 L/sec
FEF 25-75 Pre: 4.07 L/sec
FEF2575-%Change-Post: 9 %
FEF2575-%Pred-Post: 183 %
FEF2575-%Pred-Pre: 167 %
FEV1-%Change-Post: 2 %
FEV1-%Pred-Post: 123 %
FEV1-%Pred-Pre: 120 %
FEV1-Post: 3.23 L
FEV1-Pre: 3.14 L
FEV1FVC-%Change-Post: 0 %
FEV1FVC-%Pred-Pre: 110 %
FEV6-%Change-Post: 2 %
FEV6-%Pred-Post: 114 %
FEV6-%Pred-Pre: 111 %
FEV6-Post: 3.73 L
FEV6-Pre: 3.62 L
FEV6FVC-%Change-Post: 0 %
FEV6FVC-%Pred-Post: 103 %
FEV6FVC-%Pred-Pre: 103 %
FVC-%Change-Post: 2 %
FVC-%Pred-Post: 111 %
FVC-%Pred-Pre: 107 %
FVC-Post: 3.73 L
FVC-Pre: 3.62 L
Post FEV1/FVC ratio: 87 %
Post FEV6/FVC ratio: 100 %
Pre FEV1/FVC ratio: 87 %
Pre FEV6/FVC Ratio: 100 %
RV % pred: 81 %
RV: 1.59 L
TLC % pred: 106 %
TLC: 5.39 L

## 2020-01-06 NOTE — Progress Notes (Signed)
PFT done today. 

## 2020-01-09 ENCOUNTER — Telehealth: Payer: Self-pay | Admitting: Nurse Practitioner

## 2020-01-09 ENCOUNTER — Other Ambulatory Visit: Payer: Self-pay

## 2020-01-09 ENCOUNTER — Ambulatory Visit: Payer: 59 | Admitting: Pulmonary Disease

## 2020-01-09 ENCOUNTER — Encounter: Payer: Self-pay | Admitting: Pulmonary Disease

## 2020-01-09 VITALS — BP 126/76 | HR 83 | Temp 98.0°F | Ht 64.0 in | Wt 252.8 lb

## 2020-01-09 DIAGNOSIS — R0602 Shortness of breath: Secondary | ICD-10-CM

## 2020-01-09 DIAGNOSIS — J4530 Mild persistent asthma, uncomplicated: Secondary | ICD-10-CM | POA: Diagnosis not present

## 2020-01-09 DIAGNOSIS — R635 Abnormal weight gain: Secondary | ICD-10-CM | POA: Diagnosis not present

## 2020-01-09 NOTE — Telephone Encounter (Signed)
Patient would like to return to work on 01/11/2020.  Work note ready

## 2020-01-09 NOTE — Telephone Encounter (Signed)
Ok for note to go back to work- call patient and find out what date we need to put on note

## 2020-01-09 NOTE — Patient Instructions (Signed)
Shortness of breath is better  Continue albuterol You may consider stopping Advair has we discussed, or continue as you were before  Reassuringly all the studies that we discussed were within normal limits  Call with significant concerns  We will see you in 6 months

## 2020-01-09 NOTE — Progress Notes (Signed)
Deanna [name]    [id number]    1961-03-17  Primary Care Physician:Crespo, Mary-Margaret, FNP  Referring Physician: Chevis Pretty, [place] [address] [place] [place],  [place] [address]  Chief complaint:   Follow-up for shortness of breath Symptoms are better  HPI:  Symptoms are improved Has started exercising regularly Continues to use Advair Infrequent use of albuterol  Previously: Did have a bronchitis in February and just never recovered fully She has been 2 rounds of antibiotics and steroids She usually will have a bronchitis about twice a year but with antibiotics and steroids would usually be able to get rid of symptoms within a few days Recently been diagnosed with reflux, large hiatal hernia Diverticulitis  Denies any significant chest pains or chest discomfort  Did not grow up with asthma or any other breathing problems She did smoke for couple years-social smoking quit in 1999  No pertinent occupational history  Has gained about 50 pounds in the last year Gained about 25 pounds since she got sick in February  Outpatient Encounter Medications as of 01/09/2020  Medication Sig  . ALPRAZolam (XANAX) 1 MG tablet Take 1 tablet (1 mg total) by mouth 2 (two) times daily as needed for anxiety.  . baclofen (LIORESAL) 10 MG tablet 1 p.o. before supper and before bed  . buPROPion (WELLBUTRIN XL) 150 MG 24 hr tablet Take 3 tablets (450 mg total) by mouth daily.  [name] CALCIUM-VITAMIN D PO Take 1 tablet by mouth. 600 mg of calcium  . fluticasone (FLONASE) 50 MCG/ACT nasal spray Place 2 sprays into both nostrils daily.  . Fluticasone-Salmeterol (ADVAIR DISKUS) 250-50 MCG/DOSE AEPB Inhale 1 puff into the lungs 2 (two) times daily.  . furosemide (LASIX) 20 MG tablet Take 1 tablet (20 mg total) by mouth daily. (Needs to be seen before next refill)  . montelukast (SINGULAIR) 10 MG tablet TAKE 1 TABLET ONCE DAILY IN THE EVENING.  [name] olmesartan (BENICAR) 40 MG tablet  Take 1 tablet (40 mg total) by mouth every morning. (Needs to be seen before next refill)  . omeprazole (PRILOSEC) 40 MG capsule Take 1 capsule (40 mg total) by mouth daily.  . Probiotic Product (MEGA PROBIOTIC PO) Take 1 capsule by mouth daily.  . SUMAtriptan (IMITREX) 100 MG tablet TAKE 1 TABLET ONCE DAILY AS NEEDED FOR MIGRAINE AS DIRECTED BY PHYSICIAN.  . valACYclovir (VALTREX) 1000 MG tablet TAKE  (1)  TABLET  EVERY TWELVE HOURS.  . VENTOLIN HFA 108 (90 Base) MCG/ACT inhaler Inhale 2 puffs into the lungs every 6 (six) hours as needed for wheezing or shortness of breath.  . [DISCONTINUED] amLODipine (NORVASC) 5 MG tablet Take 1 tablet (5 mg total) by mouth daily.   No facility-administered encounter medications on file as of 01/09/2020.    Allergies as of 01/09/2020 - Review Complete 01/09/2020  Allergen Reaction Noted  . Iohexol Anaphylaxis 07/14/2011  . Clindamycin/lincomycin Nausea And Vomiting 12/28/2018    Past Medical History:  Diagnosis Date  . Cancer [facility])    breast  . Diverticulosis of sigmoid colon 07/14/2011  . GERD (gastroesophageal reflux disease)   . Migraine   . PONV (postoperative nausea and vomiting)   . Tubular adenoma of colon     Past Surgical History:  Procedure Laterality Date  . ABDOMINAL HYSTERECTOMY  02/2010   partial  . BREAST REDUCTION SURGERY  02/2011  . BREAST SURGERY     cancer  . CHOLECYSTECTOMY  07/17/11   w/IOC  .  COLONOSCOPY    . NASAL SINUS SURGERY  02/2011  . TONSILLECTOMY  age 58    Family History  Problem Relation Age of Onset  . Mental illness Mother   . Stroke Mother        died with this at age 72  . Heart disease Mother   . Hyperlipidemia Mother   . Alcohol abuse Father   . Cancer Paternal Grandmother        colon  . Colon cancer Paternal Grandmother   . Alcohol abuse Brother   . Asthma Brother   . Hypertension Brother   . Esophageal cancer Neg Hx   . Rectal cancer Neg Hx   . Stomach cancer Neg Hx     Social  History   Socioeconomic History  . Marital status: Divorced    Spouse name: Not on file  . Number of children: 1  . Years of education: Not on file  . Highest education level: Not on file  Occupational History  . 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[employer]  Tobacco Use  . Smoking status: Former Smoker    Packs/day: 0.25    Years: 2.00    Pack years: 0.50    Quit date: 07/28/2000    Years since quitting: 19.4  . Smokeless tobacco: Never Used  Vaping Use  . Vaping Use: Never used  Substance and Sexual Activity  . Alcohol use: No  . Drug use: No  . Sexual activity: Not on file  Other Topics Concern  . Not on file  Social History Narrative  . Not on file   Social Determinants of Health   Financial Resource Strain:   . Difficulty of Paying Living Expenses:   Food Insecurity:   . Worried About Charity fundraiser in the Last Year:   . Arboriculturist in the Last Year:   Transportation Needs:   . Film/video editor (Medical):   [name] Lack of Transportation (Non-Medical):   Physical Activity:   . Days of Exercise per Week:   . Minutes of Exercise per Session:   Stress:   . Feeling of Stress :   Social Connections:   . Frequency of Communication with Friends and Family:   . Frequency of Social Gatherings with Friends and Family:   . Attends Religious Services:   . Active Member of Clubs or Organizations:   . Attends Archivist Meetings:   [name] Marital Status:   Intimate Partner Violence:   . Fear of Current or Ex-Partner:   . Emotionally Abused:   [name] Physically Abused:   . Sexually Abused:     Review of Systems  Constitutional: Negative for fatigue.  Respiratory: Negative for cough, chest tightness, shortness of breath and wheezing.     Vitals:   01/09/20 0855  BP: 126/76  Pulse: 83  Temp: 98 F (36.7 C)  SpO2: 97%     Physical Exam Constitutional:      Appearance: She is well-developed.  HENT:     Head: Normocephalic and atraumatic.  Eyes:      General:        Right eye: No discharge.        Left eye: No discharge.     Pupils: Pupils are equal, round, and reactive to light.  Neck:     Thyroid: No thyromegaly.     Trachea: No tracheal deviation.  Cardiovascular:     Rate and Rhythm: Normal rate and regular rhythm.  Pulmonary:  Effort: Pulmonary effort is normal. No respiratory distress.     Breath sounds: Normal breath sounds. No wheezing or rales.  Chest:     Chest wall: No tenderness.  Musculoskeletal:     Cervical back: No rigidity or tenderness.  Neurological:     Mental Status: She is alert and oriented to person, place, and time.     Cranial Nerves: No cranial nerve deficit.    Data Reviewed: Blood work from 11/04/2019-normal BMP, normal CBC TFT from 2020 was within normal limits Allergy tested from 2020 shows multiple food allergies  TFT 5/3 within normal limits PFT 6/11 within normal limits Chest x-ray 5/3 is clear reviewed by myself Echocardiogram 5/11 within normal limits  Assessment:  Shortness of breath on exertion -Testing overall is within normal limits -She is feeling better -She has started exercising on a regular basis  Past history of asthma -Continues to use Advair -Infrequent use of albuterol  Obesity -Focusing on weight loss efforts   Plan/Recommendations: Graded exercise recommended  We will continue current inhalers which include Advair and albuterol as needed -She may consider stopping Advair -Continue albuterol use as needed  Follow-up in 6 months  Encouraged to call with any significant concerns   Sherrilyn Rist MD [facility] 01/09/2020, 9:07 AM  CC: Hassell Done, Mary-Margaret, *

## 2020-01-25 IMAGING — CT CT ABDOMEN AND PELVIS WITHOUT CONTRAST
2 of 4 series · 16 of 46 positions shown, 18 images · non-contrast
Comparison: None.

CLINICAL DATA: Left flank pain

EXAM:
CT ABDOMEN AND PELVIS WITHOUT CONTRAST
TECHNIQUE: Multidetector CT imaging of the abdomen and pelvis was performed
following the standard protocol without IV contrast. Oral enteric
contrast was administered.

[Series 2: abd/ pelvis · axial · 0.84mm/px · z∈[-479,-39]mm · 13 of 97 slices shown, 15 images]
[im 5/97  soft-tissue]
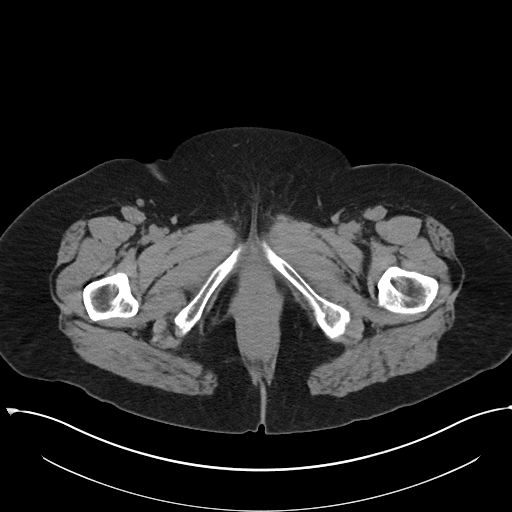
[im 5/97  bone]
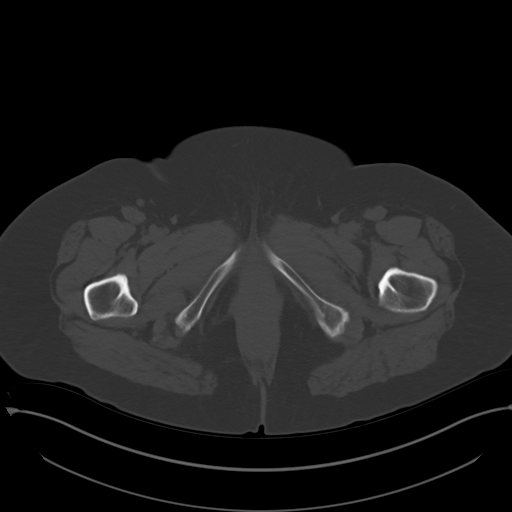
[im 13/97  soft-tissue]
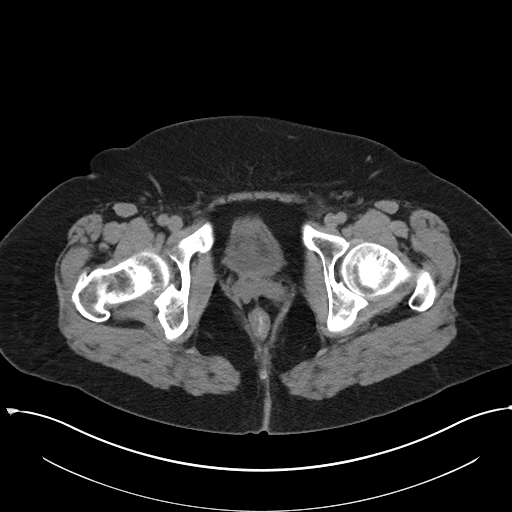
[im 21/97  soft-tissue]
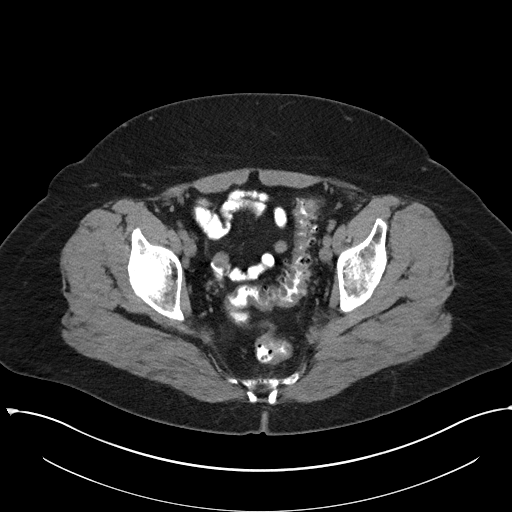
[im 29/97  soft-tissue]
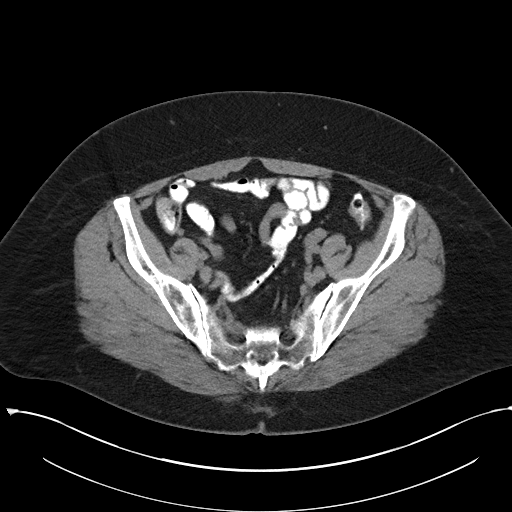
[im 33/97  soft-tissue]
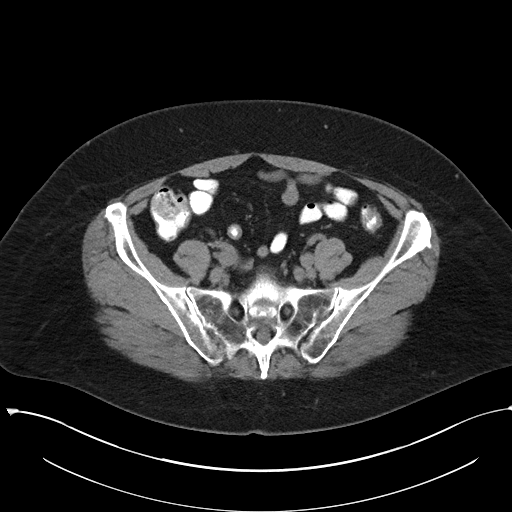
[im 41/97  soft-tissue]
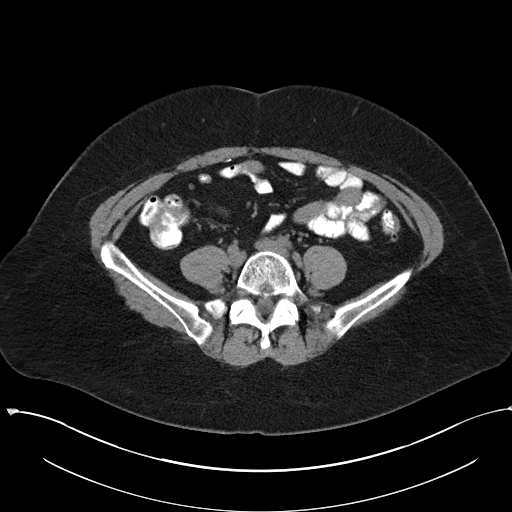
[im 49/97  soft-tissue]
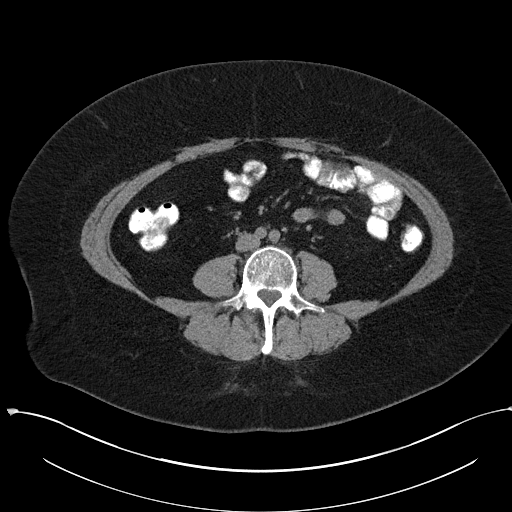
[im 57/97  soft-tissue]
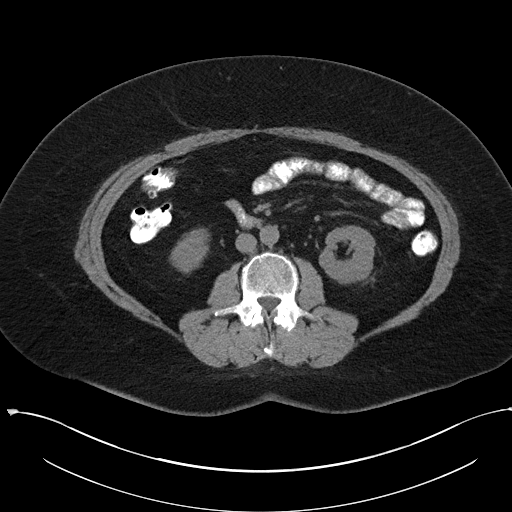
[im 65/97  soft-tissue]
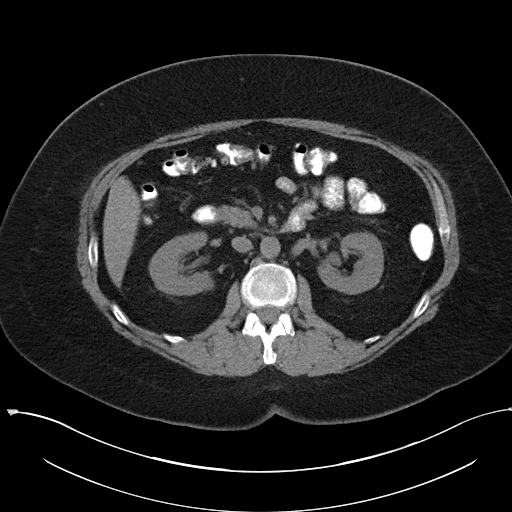
[im 65/97  bone]
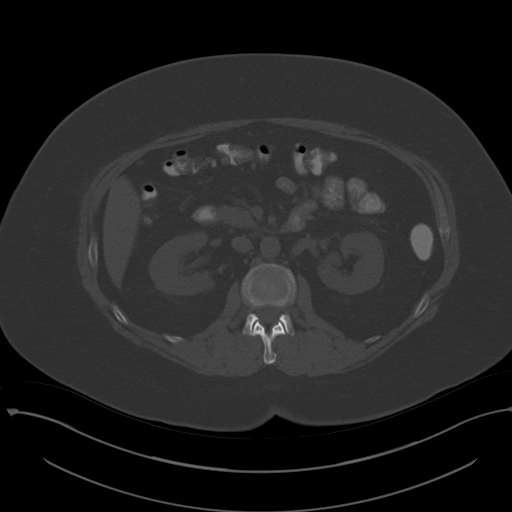
[im 69/97  soft-tissue]
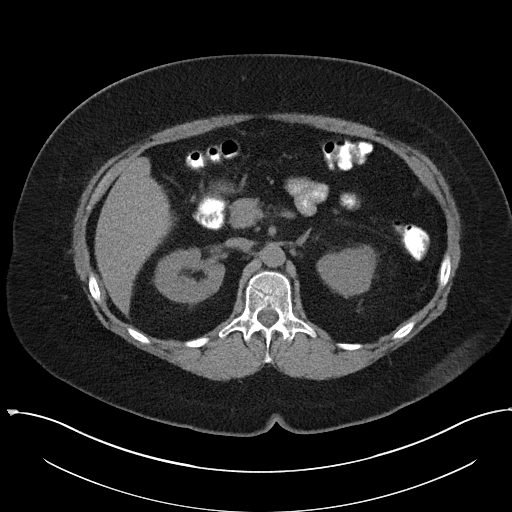
[im 77/97  soft-tissue]
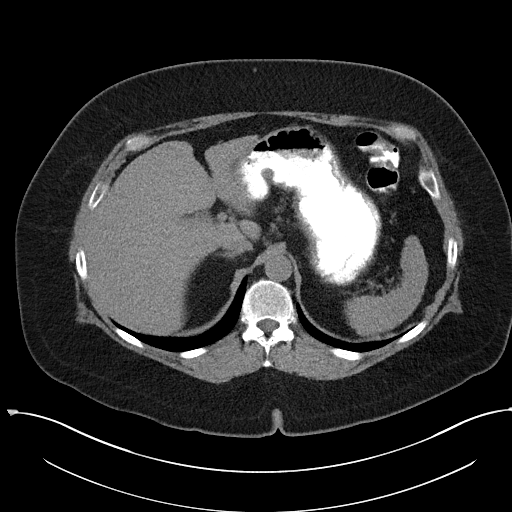
[im 85/97  soft-tissue]
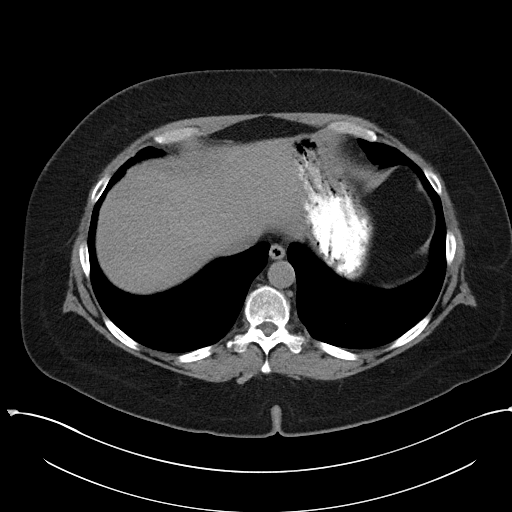
[im 93/97  soft-tissue]
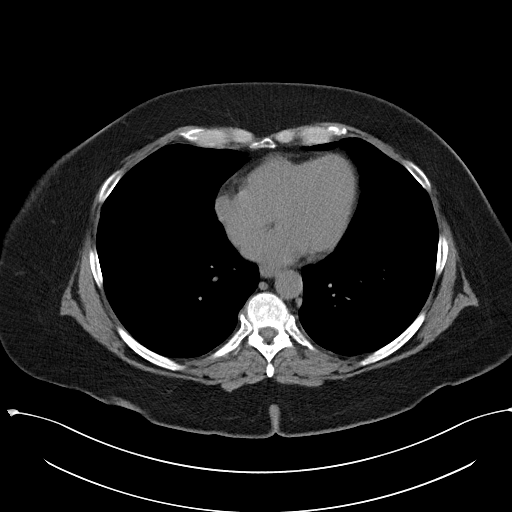

[Series 5: cor st · coronal · 0.77mm/px · 3 of 87 slices shown]
[im 29/87  soft-tissue]
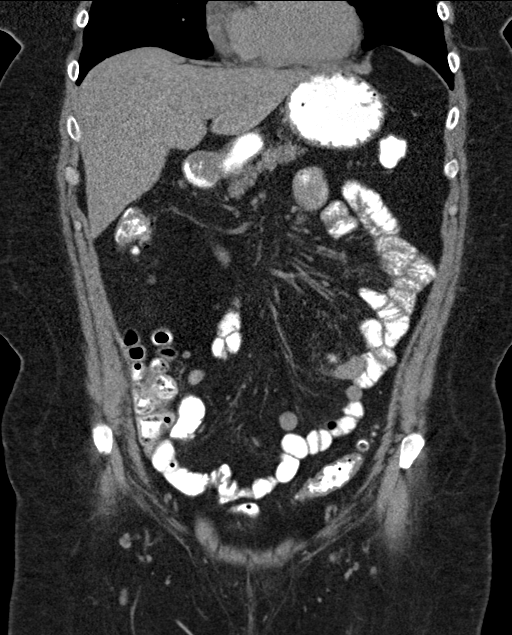
[im 39/87  soft-tissue]
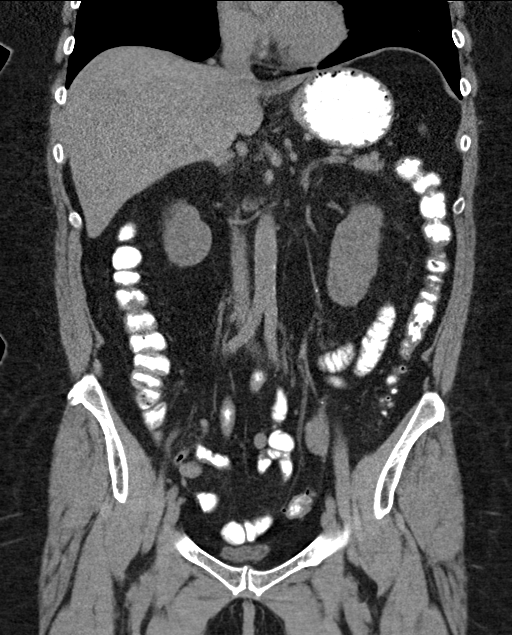
[im 48/87  soft-tissue]
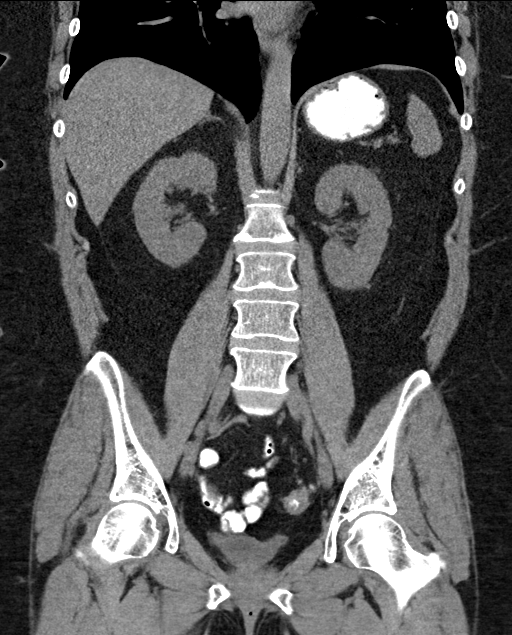

[16 of 46 positions shown; findings below may reference images not displayed]

FINDINGS: Lower chest: No acute abnormality.

Hepatobiliary: No focal liver abnormality is seen. Status post
cholecystectomy. No biliary dilatation.

Pancreas: Unremarkable. No pancreatic ductal dilatation or
surrounding inflammatory changes.

Spleen: Normal in size without focal abnormality.

Adrenals/Urinary Tract: Adrenal glands are unremarkable. Kidneys are
normal, without renal calculi, focal lesion, or hydronephrosis.
Bladder is unremarkable.

Stomach/Bowel: Stomach is within normal limits. Appendix appears
normal. No evidence of bowel wall thickening, distention, or
inflammatory changes. There is severe sigmoid diverticulosis with
mild wall thickening of the sigmoid colon (series 2, image 77).

Vascular/Lymphatic: No significant vascular findings are present. No
enlarged abdominal or pelvic lymph nodes.

Reproductive: No mass or other abnormality. Status post
hysterectomy.

Other: No abdominal wall hernia or abnormality. No abdominopelvic
ascites.

Musculoskeletal: No acute or significant osseous findings.
IMPRESSION: 1. There is severe sigmoid diverticulosis with mild wall thickening
of the sigmoid colon (series 2, image 77). Findings could represent
mild diverticulitis or chronic sequelae of diverticulitis. There is
no evidence of complicating perforation or abscess.

2.  Other chronic and incidental findings as detailed above.

## 2020-02-08 ENCOUNTER — Ambulatory Visit: Payer: 59 | Admitting: Nurse Practitioner

## 2020-02-08 ENCOUNTER — Encounter: Payer: Self-pay | Admitting: Nurse Practitioner

## 2020-02-08 ENCOUNTER — Telehealth: Payer: Self-pay | Admitting: Nurse Practitioner

## 2020-02-08 ENCOUNTER — Other Ambulatory Visit: Payer: Self-pay

## 2020-02-08 VITALS — BP 132/83 | HR 102 | Temp 99.6°F | Resp 20 | Ht 64.0 in | Wt 248.0 lb

## 2020-02-08 DIAGNOSIS — Z6831 Body mass index (BMI) 31.0-31.9, adult: Secondary | ICD-10-CM

## 2020-02-08 DIAGNOSIS — K2100 Gastro-esophageal reflux disease with esophagitis, without bleeding: Secondary | ICD-10-CM

## 2020-02-08 DIAGNOSIS — F320 Major depressive disorder, single episode, mild: Secondary | ICD-10-CM

## 2020-02-08 DIAGNOSIS — G43809 Other migraine, not intractable, without status migrainosus: Secondary | ICD-10-CM | POA: Diagnosis not present

## 2020-02-08 DIAGNOSIS — F900 Attention-deficit hyperactivity disorder, predominantly inattentive type: Secondary | ICD-10-CM

## 2020-02-08 DIAGNOSIS — K219 Gastro-esophageal reflux disease without esophagitis: Secondary | ICD-10-CM | POA: Diagnosis not present

## 2020-02-08 DIAGNOSIS — K5730 Diverticulosis of large intestine without perforation or abscess without bleeding: Secondary | ICD-10-CM

## 2020-02-08 DIAGNOSIS — F411 Generalized anxiety disorder: Secondary | ICD-10-CM

## 2020-02-08 DIAGNOSIS — J4521 Mild intermittent asthma with (acute) exacerbation: Secondary | ICD-10-CM

## 2020-02-08 DIAGNOSIS — J4530 Mild persistent asthma, uncomplicated: Secondary | ICD-10-CM

## 2020-02-08 DIAGNOSIS — I10 Essential (primary) hypertension: Secondary | ICD-10-CM

## 2020-02-08 MED ORDER — METRONIDAZOLE 500 MG PO TABS
500.0000 mg | ORAL_TABLET | Freq: Two times a day (BID) | ORAL | 0 refills | Status: DC
Start: 2020-02-08 — End: 2020-09-21

## 2020-02-08 MED ORDER — CIPROFLOXACIN HCL 500 MG PO TABS
500.0000 mg | ORAL_TABLET | Freq: Two times a day (BID) | ORAL | 0 refills | Status: DC
Start: 2020-02-08 — End: 2020-09-21

## 2020-02-08 MED ORDER — SUMATRIPTAN SUCCINATE 100 MG PO TABS: ORAL_TABLET | ORAL | 11 refills | Status: DC

## 2020-02-08 MED ORDER — ALPRAZOLAM 1 MG PO TABS: 1.0000 mg | ORAL_TABLET | Freq: Two times a day (BID) | ORAL | 2 refills | Status: DC | PRN

## 2020-02-08 MED ORDER — ADVAIR DISKUS 250-50 MCG/DOSE IN AEPB
1.0000 | INHALATION_SPRAY | Freq: Every day | RESPIRATORY_TRACT | 5 refills | Status: DC
Start: 2020-02-08 — End: 2020-09-21

## 2020-02-08 MED ORDER — FLUTICASONE-SALMETEROL 250-50 MCG/DOSE IN AEPB: 1.0000 | INHALATION_SPRAY | Freq: Two times a day (BID) | RESPIRATORY_TRACT | 5 refills | Status: DC

## 2020-02-08 MED ORDER — OMEPRAZOLE 40 MG PO CPDR: 40.0000 mg | DELAYED_RELEASE_CAPSULE | Freq: Every day | ORAL | 2 refills | Status: DC

## 2020-02-08 NOTE — Telephone Encounter (Signed)
Pt called stating that [facility] sent in a generic brand Advair for pt but per pharmacy, it is much cheaper for pt to get Advair brand. Says the pharmacist told pt to call and ask [facility]MM if she can rewrite Rx for Advair [id number] and send back to them to fill.

## 2020-02-08 NOTE — Patient Instructions (Signed)
Stress, Adult Stress is a normal reaction to life events. Stress is what you feel when life demands more than you are used to, or more than you think you can handle. Some stress can be useful, such as studying for a test or meeting a deadline at work. Stress that occurs too often or for too long can cause problems. It can affect your emotional health and interfere with relationships and normal daily activities. Too much stress can weaken your body's defense system (immune system) and increase your risk for physical illness. If you already have a medical problem, stress can make it worse. What are the causes? All sorts of life events can cause stress. An event that causes stress for one person may not be stressful for another person. Major life events, whether positive or negative, commonly cause stress. Examples include:  Losing a job or starting a new job.  Losing a loved one.  Moving to a new town or home.  Getting married or divorced.  Having a baby.  Getting injured or sick. Less obvious life events can also cause stress, especially if they occur day after day or in combination with each other. Examples include:  Working long hours.  Driving in traffic.  Caring for children.  Being in debt.  Being in a difficult relationship. What are the signs or symptoms? Stress can cause emotional symptoms, including:  Anxiety. This is feeling worried, afraid, on edge, overwhelmed, or out of control.  Anger, including irritation or impatience.  Depression. This is feeling sad, down, helpless, or guilty.  Trouble focusing, remembering, or making decisions. Stress can cause physical symptoms, including:  Aches and pains. These may affect your head, neck, back, stomach, or other areas of your body.  Tight muscles or a clenched jaw.  Low energy.  Trouble sleeping. Stress can cause unhealthy behaviors, including:  Eating to feel better (overeating) or skipping meals.  Working too  much or putting off tasks.  Smoking, drinking alcohol, or using drugs to feel better. How is this diagnosed? Stress is diagnosed through an assessment by your health care provider. He or she may diagnose this condition based on:  Your symptoms and any stressful life events.  Your medical history.  Tests to rule out other causes of your symptoms. Depending on your condition, your health care provider may refer you to a specialist for further evaluation. How is this treated?  Stress management techniques are the recommended treatment for stress. Medicine is not typically recommended for the treatment of stress. Techniques to reduce your reaction to stressful life events include:  Stress identification. Monitor yourself for symptoms of stress and identify what causes stress for you. These skills may help you to avoid or prepare for stressful events.  Time management. Set your priorities, keep a calendar of events, and learn to say no. Taking these actions can help you avoid making too many commitments. Techniques for coping with stress include:  Rethinking the problem. Try to think realistically about stressful events rather than ignoring them or overreacting. Try to find the positives in a stressful situation rather than focusing on the negatives.  Exercise. Physical exercise can release both physical and emotional tension. The key is to find a form of exercise that you enjoy and do it regularly.  Relaxation techniques. These relax the body and mind. The key is to find one or more that you enjoy and use the techniques regularly. Examples include: ? Meditation, deep breathing, or progressive relaxation techniques. ? Yoga or   tai chi. ? Biofeedback, mindfulness techniques, or journaling. ? Listening to music, being out in nature, or participating in other hobbies.  Practicing a healthy lifestyle. Eat a balanced diet, drink plenty of water, limit or avoid caffeine, and get plenty of  sleep.  Having a strong support network. Spend time with family, friends, or other people you enjoy being around. Express your feelings and talk things over with someone you trust. Counseling or talk therapy with a mental health professional may be helpful if you are having trouble managing stress on your own. Follow these instructions at home: Lifestyle   Avoid drugs.  Do not use any products that contain nicotine or tobacco, such as cigarettes, e-cigarettes, and chewing tobacco. If you need help quitting, ask your health care provider.  Limit alcohol intake to no more than 1 drink a day for nonpregnant women and 2 drinks a day for men. One drink equals 12 oz of beer, 5 oz of wine, or 1 oz of hard liquor  Do not use alcohol or drugs to relax.  Eat a balanced diet that includes fresh fruits and vegetables, whole grains, lean meats, fish, eggs, and beans, and low-fat dairy. Avoid processed foods and foods high in added fat, sugar, and salt.  Exercise at least 30 minutes on 5 or more days each week.  Get 7-8 hours of sleep each night. General instructions   Practice stress management techniques as discussed with your health care provider.  Drink enough fluid to keep your urine clear or pale yellow.  Take over-the-counter and prescription medicines only as told by your health care provider.  Keep all follow-up visits as told by your health care provider. This is important. Contact a health care provider if:  Your symptoms get worse.  You have new symptoms.  You feel overwhelmed by your problems and can no longer manage them on your own. Get help right away if:  You have thoughts of hurting yourself or others. If you ever feel like you may hurt yourself or others, or have thoughts about taking your own life, get help right away. You can go to your nearest emergency department or call:  Your local emergency services (911 in the [place]  A suicide crisis helpline, such as the  [facility] at 941-814-5003. This is open 24 hours a day. Summary  Stress is a normal reaction to life events. It can cause problems if it happens too often or for too long.  Practicing stress management techniques is the best way to treat stress.  Counseling or talk therapy with a mental health professional may be helpful if you are having trouble managing stress on your own. This information is not intended to replace advice given to you by your health care provider. Make sure you discuss any questions you have with your health care provider. Document Revised: 02/11/2019 Document Reviewed: 09/03/2016 Elsevier Patient Education  [employer].

## 2020-02-08 NOTE — Progress Notes (Signed)
Subjective:    Patient ID: Deanna [name], female    DOB: [birth year], 59 y.o.   MRN: [id number]   Chief Complaint: Medical Management of Chronic Issues    HPI:  1. Essential hypertension, benign Does check BP at home, does not feel that it is reliable. Typically 132/86 on a typical day. Does try to restrict sodium in her diet.  BP Readings from Last 3 Encounters:  01/09/20 126/76  12/19/19 (!) 145/91  11/28/19 (!) 144/86     2. Other migraine without status migrainosus, not intractable Reports having migraines, 2 this week since Sunday.  They come and go.  3. Mild persistent asthma without complication Breathing is good. Denies cough. Uses her advair daily. Has not needed albuterol much lately.  4. Gastroesophageal reflux disease without esophagitis Is on omeprrazole daily and that was working but has had some symptoms lately.  5. Diverticulosis of sigmoid colon Had some pain last night. Had chilli beans forr dinner and she was up going to bathroom frequently.  6. Body mass index 31.0-31.9, adult No recent weight changes Wt Readings from Last 3 Encounters:  02/08/20 248 lb (112.5 kg)  01/09/20 252 lb 12.8 oz (114.7 kg)  12/19/19 253 lb (114.8 kg)   BMI Readings from Last 3 Encounters:  02/08/20 42.57 kg/m  01/09/20 43.39 kg/m  12/19/19 42.10 kg/m    7. Depression, major, single episode, mild ([place]) Is on wellbutrin daily. Overall doing well. Her daughter just had her third miscarriage and she is worried about her.\ Depression screen [facility] 2/9 02/08/2020 12/19/2019 11/07/2019  Decreased Interest 1 0 0  Down, Depressed, Hopeless 1 0 0  PHQ - 2 Score 2 0 0  Altered sleeping 1 - -  Tired, decreased energy 1 - -  Change in appetite 1 - -  Feeling bad or failure about yourself  3 - -  Trouble concentrating 1 - -  Moving slowly or fidgety/restless 0 - -  Suicidal thoughts 0 - -  PHQ-9 Score 9 - -  Difficult doing work/chores Not difficult at all - -  Some recent  data might be hidden     8. Attention deficit hyperactivity disorder (ADHD), predominantly inattentive type Is currrently on no meds right now.  9. GAD (generalized anxiety disorder) Stressed about her daughter as stated above. Xanax 1mg  BID most days. GAD 7 : Generalized Anxiety Score 02/08/2020 11/07/2019 06/07/2019  Nervous, Anxious, on Edge 1 1 0  Control/stop worrying 3 1 0  Worry too much - different things 3 1 1   Trouble relaxing 1 0 2  Restless 0 0 0  Easily annoyed or irritable 0 1 2  Afraid - awful might happen 0 0 0  Total GAD 7 Score 8 4 5   Anxiety Difficulty Not difficult at all - Somewhat difficult        Outpatient Encounter Medications as of 02/08/2020  Medication Sig  . ALPRAZolam (XANAX) 1 MG tablet Take 1 tablet (1 mg total) by mouth 2 (two) times daily as needed for anxiety.  . baclofen (LIORESAL) 10 MG tablet 1 p.o. before supper and before bed  . buPROPion (WELLBUTRIN XL) 150 MG 24 hr tablet Take 3 tablets (450 mg total) by mouth daily.  [name] CALCIUM-VITAMIN D PO Take 1 tablet by mouth. 600 mg of calcium  . fluticasone (FLONASE) 50 MCG/ACT nasal spray Place 2 sprays into both nostrils daily.  . Fluticasone-Salmeterol (ADVAIR DISKUS) 250-50 MCG/DOSE AEPB Inhale 1 puff into the lungs 2 (two)  times daily.  . furosemide (LASIX) 20 MG tablet Take 1 tablet (20 mg total) by mouth daily. (Needs to be seen before next refill)  . montelukast (SINGULAIR) 10 MG tablet TAKE 1 TABLET ONCE DAILY IN THE EVENING.  [name] olmesartan (BENICAR) 40 MG tablet Take 1 tablet (40 mg total) by mouth every morning. (Needs to be seen before next refill)  . omeprazole (PRILOSEC) 40 MG capsule Take 1 capsule (40 mg total) by mouth daily.  . Probiotic Product (MEGA PROBIOTIC PO) Take 1 capsule by mouth daily.  . SUMAtriptan (IMITREX) 100 MG tablet TAKE 1 TABLET ONCE DAILY AS NEEDED FOR MIGRAINE AS DIRECTED BY PHYSICIAN.  . valACYclovir (VALTREX) 1000 MG tablet TAKE  (1)  TABLET  EVERY TWELVE  HOURS.  . VENTOLIN HFA 108 (90 Base) MCG/ACT inhaler Inhale 2 puffs into the lungs every 6 (six) hours as needed for wheezing or shortness of breath.   No facility-administered encounter medications on file as of 02/08/2020.    Past Surgical History:  Procedure Laterality Date  . ABDOMINAL HYSTERECTOMY  02/2010   partial  . BREAST REDUCTION SURGERY  02/2011  . BREAST SURGERY     cancer  . CHOLECYSTECTOMY  07/17/11   w/IOC  . COLONOSCOPY    . NASAL SINUS SURGERY  02/2011  . TONSILLECTOMY  age 32    Family History  Problem Relation Age of Onset  . Mental illness Mother   . Stroke Mother        died with this at age 53  . Heart disease Mother   . Hyperlipidemia Mother   . Alcohol abuse Father   . Cancer Paternal Grandmother        colon  . Colon cancer Paternal Grandmother   . Alcohol abuse Brother   . Asthma Brother   . Hypertension Brother   . Esophageal cancer Neg Hx   . Rectal cancer Neg Hx   . Stomach cancer Neg Hx     New complaints: None other then stated above.  Social history: Lives with her boyfriend  Controlled substance contract: n/a     Review of Systems  Constitutional: Negative.   HENT: Negative.   Eyes: Negative.   Respiratory: Negative.   Cardiovascular: Negative.   Gastrointestinal: Negative.   Endocrine: Negative.   Genitourinary: Negative.   Musculoskeletal: Negative.   Skin: Negative.   Allergic/Immunologic: Negative.   Neurological: Negative.   Hematological: Negative.   Psychiatric/Behavioral: Negative.        Objective:   Physical Exam Vitals reviewed.  Constitutional:      Appearance: Normal appearance. She is normal weight.  HENT:     Head: Normocephalic and atraumatic.     Right Ear: Tympanic membrane, ear canal and external ear normal.     Left Ear: Tympanic membrane, ear canal and external ear normal.     Nose: Nose normal.     Mouth/Throat:     Mouth: Mucous membranes are moist.     Pharynx: Oropharynx is clear.    Eyes:     Extraocular Movements: Extraocular movements intact.     Conjunctiva/sclera: Conjunctivae normal.     Pupils: Pupils are equal, round, and reactive to light.  Cardiovascular:     Rate and Rhythm: Normal rate and regular rhythm.     Pulses: Normal pulses.     Heart sounds: Normal heart sounds.  Pulmonary:     Effort: Pulmonary effort is normal.     Breath sounds: Normal breath sounds.  Abdominal:     General: Abdomen is flat. Bowel sounds are normal.     Palpations: Abdomen is soft.  Genitourinary:    General: Normal vulva.     Rectum: Normal.  Musculoskeletal:        General: Normal range of motion.     Cervical back: Normal range of motion and neck supple.  Skin:    General: Skin is warm and dry.     Capillary Refill: Capillary refill takes less than 2 seconds.  Neurological:     General: No focal deficit present.     Mental Status: She is alert and oriented to person, place, and time. Mental status is at baseline.  Psychiatric:        Mood and Affect: Mood normal.        Behavior: Behavior normal.        Thought Content: Thought content normal.        Judgment: Judgment normal.     BP 132/83   Pulse (!) 102   Temp 99.6 F (37.6 C) (Temporal)   Resp 20   Ht 5\' 4"  (1.626 m)   Wt 248 lb (112.5 kg)   SpO2 92%   BMI 42.57 kg/m      Assessment & Plan:  KENISHIA PLACK comes in today with chief complaint of Medical Management of Chronic Issues   Diagnosis and orders addressed:  1. Essential hypertension, benign Patient instructed to comply with a sodium-restricted diet and check BP regularly at home.   2. Other migraine without status migrainosus, not intractable Patient instructed to avoid triggers.   3. Mild persistent asthma without complication Patient instructed to avoid triggers.   4. Gastroesophageal reflux disease without esophagitis Patient instructed to avoid spicy or acidic foods and wait at least 30 minutes before laying down after  eating.   5. Diverticulosis of sigmoid colon Patient instructed to avoid seeded foods and nuts.  6. Body mass index 31.0-31.9, adult Patient instructed to follow a fat-restricted diet and exercise as tolerated.   7. Depression, major, single episode, mild ([place]) Patient instructed to journal depressive episodes, consider counseling, and report feelings of self-harm.   8. Attention deficit hyperactivity disorder (ADHD), predominantly inattentive type Patient instructed to ground themselves during episodes of inattention and maintain therapeutic regimen. Report new or worsening symptoms.   9. GAD (generalized anxiety disorder) Patient encouraged to journal depressive episodes and consider counseling. Report thoughts of self-harm.   10. Diverticulitis Watch diet cipro 500mg  BID for 7 days Flagyl 500mg  bid for 7 days  Labs pending Health Maintenance reviewed Diet and exercise encouraged  Follow up plan: Follow-up in 6 months    Mary-Margaret Hassell Done, [place], [place] Student

## 2020-02-09 LAB — LIPID PANEL
Chol/HDL Ratio: 4.1 ratio (ref 0.0–4.4)
Cholesterol, Total: 179 mg/dL (ref 100–199)
HDL: 44 mg/dL (ref >39)
LDL Chol Calc (NIH): 110 mg/dL — ABNORMAL HIGH (ref 0–99)
Triglycerides: 142 mg/dL (ref 0–149)
VLDL Cholesterol Cal: 25 mg/dL (ref 5–40)

## 2020-02-09 LAB — CMP14+EGFR
ALT: 30 IU/L (ref 0–32)
AST: 22 IU/L (ref 0–40)
Albumin/Globulin Ratio: 1.6 (ref 1.2–2.2)
Albumin: 4.2 g/dL (ref 3.8–4.9)
Alkaline Phosphatase: 108 IU/L (ref 48–121)
BUN/Creatinine Ratio: 12 (ref 9–23)
BUN: 11 mg/dL (ref 6–24)
Bilirubin Total: 0.3 mg/dL (ref 0.0–1.2)
CO2: 23 mmol/L (ref 20–29)
Calcium: 9.4 mg/dL (ref 8.7–10.2)
Chloride: 105 mmol/L (ref 96–106)
Creatinine, Ser: 0.93 mg/dL (ref 0.57–1.00)
GFR calc Af Amer: 78 mL/min/{1.73_m2} (ref >59)
GFR calc non Af Amer: 68 mL/min/{1.73_m2} (ref >59)
Globulin, Total: 2.6 g/dL (ref 1.5–4.5)
Glucose: 114 mg/dL — ABNORMAL HIGH (ref 65–99)
Potassium: 4.4 mmol/L (ref 3.5–5.2)
Sodium: 145 mmol/L — ABNORMAL HIGH (ref 134–144)
Total Protein: 6.8 g/dL (ref 6.0–8.5)

## 2020-02-09 LAB — CBC WITH DIFFERENTIAL/PLATELET
Basophils Absolute: 0.1 10*3/uL (ref 0.0–0.2)
Basos: 1 %
EOS (ABSOLUTE): 0.1 10*3/uL (ref 0.0–0.4)
Eos: 1 %
Hematocrit: 46.8 % — ABNORMAL HIGH (ref 34.0–46.6)
Hemoglobin: 15.4 g/dL (ref 11.1–15.9)
Immature Grans (Abs): 0 10*3/uL (ref 0.0–0.1)
Immature Granulocytes: 0 %
Lymphocytes Absolute: 2.3 10*3/uL (ref 0.7–3.1)
Lymphs: 21 %
MCH: 29.4 pg (ref 26.6–33.0)
MCHC: 32.9 g/dL (ref 31.5–35.7)
MCV: 89 fL (ref 79–97)
Monocytes Absolute: 1 10*3/uL — ABNORMAL HIGH (ref 0.1–0.9)
Monocytes: 9 %
Neutrophils Absolute: 7.4 10*3/uL — ABNORMAL HIGH (ref 1.4–7.0)
Neutrophils: 68 %
Platelets: 370 10*3/uL (ref 150–450)
RBC: 5.24 x10E6/uL (ref 3.77–5.28)
RDW: 13.4 % (ref 11.7–15.4)
WBC: 10.9 10*3/uL — ABNORMAL HIGH (ref 3.4–10.8)

## 2020-04-03 ENCOUNTER — Other Ambulatory Visit: Payer: Self-pay | Admitting: Nurse Practitioner

## 2020-04-03 DIAGNOSIS — F411 Generalized anxiety disorder: Secondary | ICD-10-CM

## 2020-04-05 ENCOUNTER — Other Ambulatory Visit: Payer: Self-pay | Admitting: Nurse Practitioner

## 2020-04-27 ENCOUNTER — Ambulatory Visit (INDEPENDENT_AMBULATORY_CARE_PROVIDER_SITE_OTHER): Payer: 59 | Admitting: Nurse Practitioner

## 2020-04-27 ENCOUNTER — Encounter: Payer: Self-pay | Admitting: Nurse Practitioner

## 2020-04-27 ENCOUNTER — Telehealth: Payer: Self-pay

## 2020-04-27 DIAGNOSIS — R0602 Shortness of breath: Secondary | ICD-10-CM | POA: Insufficient documentation

## 2020-04-27 NOTE — Progress Notes (Signed)
   Virtual Visit via telephone Note Due to COVID-19 pandemic this visit was conducted virtually. This visit type was conducted due to national recommendations for restrictions regarding the COVID-19 Pandemic (e.g. social distancing, sheltering in place) in an effort to limit this patient's exposure and mitigate transmission in our community. All issues noted in this document were discussed and addressed.  A physical exam was not performed with this format.  I connected with Deanna [name] on 04/27/20 at 12:11 pm by telephone and verified that I am speaking with the correct person using two identifiers. Deanna [name] is currently located at home and no one is currently with patient  during visit. The provider, Ivy Lynn, NP is located in their office at time of visit.  I discussed the limitations, risks, security and privacy concerns of performing an evaluation and management service by telephone and the availability of in person appointments. I also discussed with the patient that there may be a patient responsible charge related to this service. The patient expressed understanding and agreed to proceed.   History and Present Illness:  HPI  Patient is reporting shortness of breath, low-grade fever, and joint aches and pain after receiving second Covid 19 vaccination 3 days ago.  Review of Systems  Constitutional: Positive for fever. Negative for chills, malaise/fatigue and weight loss.  Respiratory: Positive for shortness of breath.   Neurological: Positive for headaches.     Observations/Objective: Virtual tele visit  Assessment and Plan: SOB (shortness of breath) Patient is a 59 year old female who presents via virtual tele visit with shortness of breath, low-grade fever, joint ache and fatigue after COVID-19 vaccine 3 days ago. Provided education to patient, symptom management.  Tylenol for pain, inhaler for shortness of breath.  Increase hydration Follow-up with worsening or  unresolved symptoms.    Follow Up Instructions: Follow up with worsening or unresolved symptoms    I discussed the assessment and treatment plan with the patient. The patient was provided an opportunity to ask questions and all were answered. The patient agreed with the plan and demonstrated an understanding of the instructions.   The patient was advised to call back or seek an in-person evaluation if the symptoms worsen or if the condition fails to improve as anticipated.  The above assessment and management plan was discussed with the patient. The patient verbalized understanding of and has agreed to the management plan. Patient is aware to call the clinic if symptoms persist or worsen. Patient is aware when to return to the clinic for a follow-up visit. Patient educated on when it is appropriate to go to the emergency department.   Time call ended:    I provided 11 minutes of non-face-to-face time during this encounter.    Ivy Lynn, NP

## 2020-04-27 NOTE — Assessment & Plan Note (Signed)
Patient is a 59 year old female who presents via virtual tele visit with shortness of breath, low-grade fever, joint ache and fatigue after COVID-19 vaccine 3 days ago. Provided education to patient, symptom management.  Tylenol for pain, inhaler for shortness of breath.  Increase hydration Follow-up with worsening or unresolved symptoms.

## 2020-05-28 ENCOUNTER — Other Ambulatory Visit: Payer: Self-pay | Admitting: Nurse Practitioner

## 2020-05-28 DIAGNOSIS — I10 Essential (primary) hypertension: Secondary | ICD-10-CM

## 2020-07-10 ENCOUNTER — Other Ambulatory Visit: Payer: Self-pay | Admitting: Nurse Practitioner

## 2020-08-10 ENCOUNTER — Ambulatory Visit: Payer: Self-pay | Admitting: Nurse Practitioner

## 2020-09-04 ENCOUNTER — Telehealth: Payer: Self-pay

## 2020-09-04 DIAGNOSIS — I10 Essential (primary) hypertension: Secondary | ICD-10-CM

## 2020-09-04 MED ORDER — AMLODIPINE BESYLATE 5 MG PO TABS: 5.0000 mg | ORAL_TABLET | Freq: Every day | ORAL | 0 refills | Status: DC

## 2020-09-04 NOTE — Telephone Encounter (Signed)
  Prescription Request  09/04/2020  What is the name of the medication or equipment? Amlodipine  Have you contacted your pharmacy to request a refill? (if applicable) Yes  Which pharmacy would you like this sent to? [place], [facility]  Pt scheduled to see MMM on 2/25 for check up but is out of her Amlodipine and needs refill to last her until her appt.

## 2020-09-04 NOTE — Telephone Encounter (Signed)
NA / mailbox full, refill sent to pharmacy

## 2020-09-21 ENCOUNTER — Other Ambulatory Visit: Payer: Self-pay

## 2020-09-21 ENCOUNTER — Ambulatory Visit (INDEPENDENT_AMBULATORY_CARE_PROVIDER_SITE_OTHER): Payer: 59 | Admitting: Nurse Practitioner

## 2020-09-21 ENCOUNTER — Encounter: Payer: Self-pay | Admitting: Nurse Practitioner

## 2020-09-21 VITALS — BP 132/77 | HR 80 | Temp 98.3°F | Ht 64.0 in | Wt 252.2 lb

## 2020-09-21 DIAGNOSIS — K219 Gastro-esophageal reflux disease without esophagitis: Secondary | ICD-10-CM

## 2020-09-21 DIAGNOSIS — I10 Essential (primary) hypertension: Secondary | ICD-10-CM | POA: Diagnosis not present

## 2020-09-21 DIAGNOSIS — G43809 Other migraine, not intractable, without status migrainosus: Secondary | ICD-10-CM

## 2020-09-21 DIAGNOSIS — J4530 Mild persistent asthma, uncomplicated: Secondary | ICD-10-CM

## 2020-09-21 DIAGNOSIS — F900 Attention-deficit hyperactivity disorder, predominantly inattentive type: Secondary | ICD-10-CM

## 2020-09-21 DIAGNOSIS — K5730 Diverticulosis of large intestine without perforation or abscess without bleeding: Secondary | ICD-10-CM

## 2020-09-21 DIAGNOSIS — F411 Generalized anxiety disorder: Secondary | ICD-10-CM

## 2020-09-21 DIAGNOSIS — Z6831 Body mass index (BMI) 31.0-31.9, adult: Secondary | ICD-10-CM

## 2020-09-21 DIAGNOSIS — F320 Major depressive disorder, single episode, mild: Secondary | ICD-10-CM

## 2020-09-21 MED ORDER — BUPROPION HCL ER (XL) 300 MG PO TB24: 300.0000 mg | ORAL_TABLET | Freq: Every day | ORAL | 1 refills | Status: DC

## 2020-09-21 MED ORDER — SUMATRIPTAN SUCCINATE 100 MG PO TABS: ORAL_TABLET | ORAL | 11 refills | Status: DC

## 2020-09-21 MED ORDER — OMEPRAZOLE 40 MG PO CPDR: 40.0000 mg | DELAYED_RELEASE_CAPSULE | Freq: Every day | ORAL | 1 refills | Status: DC

## 2020-09-21 MED ORDER — FUROSEMIDE 20 MG PO TABS: 20.0000 mg | ORAL_TABLET | Freq: Every day | ORAL | 1 refills | Status: DC

## 2020-09-21 MED ORDER — AMLODIPINE BESYLATE 5 MG PO TABS: 5.0000 mg | ORAL_TABLET | Freq: Every day | ORAL | 1 refills | Status: DC

## 2020-09-21 MED ORDER — OLMESARTAN MEDOXOMIL 40 MG PO TABS: 40.0000 mg | ORAL_TABLET | Freq: Every morning | ORAL | 1 refills | Status: DC

## 2020-09-21 MED ORDER — ALPRAZOLAM 1 MG PO TABS: 1.0000 mg | ORAL_TABLET | Freq: Two times a day (BID) | ORAL | 2 refills | Status: DC | PRN

## 2020-09-21 MED ORDER — ADVAIR DISKUS 250-50 MCG/DOSE IN AEPB: 1.0000 | INHALATION_SPRAY | Freq: Every day | RESPIRATORY_TRACT | 5 refills | Status: DC

## 2020-09-21 NOTE — Progress Notes (Signed)
Subjective:    Patient ID: Deanna [name], female    DOB: [birth year], 59 y.o.   MRN: [id number]   Chief Complaint: Medical Management of Chronic Issues    HPI:  1. Essential hypertension, benign No c/o chest pain, sob or headache. Does not check blood pressure at home. BP Readings from Last 3 Encounters:  09/21/20 132/77  02/08/20 132/83  01/09/20 126/76     2. Mild persistent asthma without complication Is on advair and is doing well.  3. Gastroesophageal reflux disease without esophagitis Is on omeprazole daily and is doing well.  4. Other migraine without status migrainosus, not intractable Has migraine 2x a week. Says weather really affects her headaches.  5. Depression, major, single episode, mild ([place]) Is on wellbutrin for her  Depression. She is doing ok but would like to go up on her dose. Depression screen [facility] 2/9 09/21/2020 02/08/2020 12/19/2019  Decreased Interest 0 1 0  Down, Depressed, Hopeless 0 1 0  PHQ - 2 Score 0 2 0  Altered sleeping - 1 -  Tired, decreased energy - 1 -  Change in appetite - 1 -  Feeling bad or failure about yourself  - 3 -  Trouble concentrating - 1 -  Moving slowly or fidgety/restless - 0 -  Suicidal thoughts - 0 -  PHQ-9 Score - 9 -  Difficult doing work/chores - Not difficult at all -  Some recent data might be hidden    6. GAD (generalized anxiety disorder) Is on xanax and takes it BID. GAD 7 : Generalized Anxiety Score 09/21/2020 02/08/2020 11/07/2019 06/07/2019  Nervous, Anxious, on Edge $Remov'2 1 1 'kDebRH$ 0  Control/stop worrying 0 3 1 0  Worry too much - different things $RemoveBeforeDE'1 3 1 1  'RvdRaFvSJWktCSv$ Trouble relaxing 0 1 0 2  Restless 0 0 0 0  Easily annoyed or irritable 2 0 1 2  Afraid - awful might happen 0 0 0 0  Total GAD 7 Score $Remov'5 8 4 5  'iiBSjK$ Anxiety Difficulty Not difficult at all Not difficult at all - Somewhat difficult    7. Diverticulosis of sigmoid colon Last flare up was a few weeks ago.  8. Attention deficit hyperactivity disorder (ADHD),  predominantly inattentive type She is currently not on any meds for this right now. She just tries real hard to concentrate.  9. Body mass index 31.0-31.9, adult Weight is up 4lbs since last visit Wt Readings from Last 3 Encounters:  09/21/20 252 lb 3.2 oz (114.4 kg)  02/08/20 248 lb (112.5 kg)  01/09/20 252 lb 12.8 oz (114.7 kg)   BMI Readings from Last 3 Encounters:  09/21/20 43.29 kg/m  02/08/20 42.57 kg/m  01/09/20 43.39 kg/m       Outpatient Encounter Medications as of 09/21/2020  Medication Sig  . ADVAIR DISKUS 250-50 MCG/DOSE AEPB Inhale 1 puff into the lungs daily.  [name] ALPRAZolam (XANAX) 1 MG tablet Take 1 tablet (1 mg total) by mouth 2 (two) times daily as needed for anxiety.  [name] amLODipine (NORVASC) 5 MG tablet Take 1 tablet (5 mg total) by mouth daily.  . baclofen (LIORESAL) 10 MG tablet 1 p.o. before supper and before bed  . buPROPion (WELLBUTRIN XL) 150 MG 24 hr tablet Take 3 tablets (450 mg total) by mouth daily.  [name] CALCIUM-VITAMIN D PO Take 1 tablet by mouth. 600 mg of calcium  . fluticasone (FLONASE) 50 MCG/ACT nasal spray Place 2 sprays into both nostrils daily.  . furosemide (LASIX) 20  MG tablet Take 1 tablet (20 mg total) by mouth daily. (Needs to be seen before next refill)  . montelukast (SINGULAIR) 10 MG tablet TAKE 1 TABLET ONCE DAILY IN THE EVENING.  [name] olmesartan (BENICAR) 40 MG tablet Take 1 tablet (40 mg total) by mouth every morning. (Needs to be seen before next refill)  . omeprazole (PRILOSEC) 40 MG capsule Take 1 capsule (40 mg total) by mouth daily.  . Probiotic Product (MEGA PROBIOTIC PO) Take 1 capsule by mouth daily.  . SUMAtriptan (IMITREX) 100 MG tablet TAKE 1 TABLET ONCE DAILY AS NEEDED FOR MIGRAINE AS DIRECTED BY PHYSICIAN.  . valACYclovir (VALTREX) 1000 MG tablet TAKE  (1)  TABLET  EVERY TWELVE HOURS.  . VENTOLIN HFA 108 (90 Base) MCG/ACT inhaler Inhale 2 puffs into the lungs every 6 (six) hours as needed for wheezing or shortness of breath.   . [DISCONTINUED] ciprofloxacin (CIPRO) 500 MG tablet Take 1 tablet (500 mg total) by mouth 2 (two) times daily.  . [DISCONTINUED] metroNIDAZOLE (FLAGYL) 500 MG tablet Take 1 tablet (500 mg total) by mouth 2 (two) times daily.   No facility-administered encounter medications on file as of 09/21/2020.    Past Surgical History:  Procedure Laterality Date  . ABDOMINAL HYSTERECTOMY  02/2010   partial  . BREAST REDUCTION SURGERY  02/2011  . BREAST SURGERY     cancer  . CHOLECYSTECTOMY  07/17/11   w/IOC  . COLONOSCOPY    . NASAL SINUS SURGERY  02/2011  . TONSILLECTOMY  age 45    Family History  Problem Relation Age of Onset  . Mental illness Mother   . Stroke Mother        died with this at age 75  . Heart disease Mother   . Hyperlipidemia Mother   . Alcohol abuse Father   . Cancer Paternal Grandmother        colon  . Colon cancer Paternal Grandmother   . Alcohol abuse Brother   . Asthma Brother   . Hypertension Brother   . Esophageal cancer Neg Hx   . Rectal cancer Neg Hx   . Stomach cancer Neg Hx     New complaints: None today  Social history: Lives with husband  Controlled substance contract: 11/15/19    Review of Systems  Constitutional: Negative for diaphoresis.  Eyes: Negative for pain.  Respiratory: Negative for shortness of breath.   Cardiovascular: Negative for chest pain, palpitations and leg swelling.  Gastrointestinal: Negative for abdominal pain.  Endocrine: Negative for polydipsia.  Skin: Negative for rash.  Neurological: Negative for dizziness, weakness and headaches.  Hematological: Does not bruise/bleed easily.  All other systems reviewed and are negative.      Objective:   Physical Exam Vitals and nursing note reviewed.  Constitutional:      General: She is not in acute distress.    Appearance: Normal appearance. She is well-developed and well-nourished.  HENT:     Head: Normocephalic.     Nose: Nose normal.     Mouth/Throat:     Mouth:  Oropharynx is clear and moist.  Eyes:     Extraocular Movements: EOM normal.     Pupils: Pupils are equal, round, and reactive to light.  Neck:     Vascular: No carotid bruit or JVD.  Cardiovascular:     Rate and Rhythm: Normal rate and regular rhythm.     Pulses: Intact distal pulses.     Heart sounds: Normal heart sounds.  Pulmonary:  Effort: Pulmonary effort is normal. No respiratory distress.     Breath sounds: Normal breath sounds. No wheezing or rales.  Chest:     Chest wall: No tenderness.  Abdominal:     General: Bowel sounds are normal. There is no distension or abdominal bruit. Aorta is normal.     Palpations: Abdomen is soft. There is no hepatomegaly, splenomegaly, mass or pulsatile mass.     Tenderness: There is no abdominal tenderness.  Musculoskeletal:        General: No edema. Normal range of motion.     Cervical back: Normal range of motion and neck supple.  Lymphadenopathy:     Cervical: No cervical adenopathy.  Skin:    General: Skin is warm and dry.  Neurological:     Mental Status: She is alert and oriented to person, place, and time.     Deep Tendon Reflexes: Reflexes are normal and symmetric.  Psychiatric:        Mood and Affect: Mood and affect normal.        Behavior: Behavior normal.        Thought Content: Thought content normal.        Judgment: Judgment normal.     BP 132/77   Pulse 80   Temp 98.3 F (36.8 C)   Ht $R'5\' 4"'jq$  (1.626 m)   Wt 252 lb 3.2 oz (114.4 kg)   BMI 43.29 kg/m        Assessment & Plan:  Deanna [name] comes in today with chief complaint of Medical Management of Chronic Issues   Diagnosis and orders addressed:  1. Essential hypertension, benign Low sodium diet - amLODipine (NORVASC) 5 MG tablet; Take 1 tablet (5 mg total) by mouth daily.  Dispense: 90 tablet; Refill: 1 - olmesartan (BENICAR) 40 MG tablet; Take 1 tablet (40 mg total) by mouth every morning. (Needs to be seen before next refill)  Dispense: 90  tablet; Refill: 1 - furosemide (LASIX) 20 MG tablet; Take 1 tablet (20 mg total) by mouth daily. (Needs to be seen before next refill)  Dispense: 90 tablet; Refill: 1 - CBC with Differential/Platelet - CMP14+EGFR - Lipid panel  2. Mild persistent asthma without complication - ADVAIR DISKUS 250-50 MCG/DOSE AEPB; Inhale 1 puff into the lungs daily.  Dispense: 60 each; Refill: 5  3. Gastroesophageal reflux disease without esophagitis Avoid spicy foods Do not eat 2 hours prior to bedtime - omeprazole (PRILOSEC) 40 MG capsule; Take 1 capsule (40 mg total) by mouth daily.  Dispense: 180 capsule; Refill: 1  4. Other migraine without status migrainosus, not intractable Avoid caffeine - SUMAtriptan (IMITREX) 100 MG tablet; TAKE 1 TABLET ONCE DAILY AS NEEDED FOR MIGRAINE AS DIRECTED BY PHYSICIAN.  Dispense: 30 tablet; Refill: 11  5. Depression, major, single episode, mild ([facility]) Stress management Increased wellbutrin from 150XL to 300Xl - buPROPion (WELLBUTRIN XL) 300 MG 24 hr tablet; Take 1 tablet (300 mg total) by mouth daily.  Dispense: 90 tablet; Refill: 1  6. GAD (generalized anxiety disorder) - ALPRAZolam (XANAX) 1 MG tablet; Take 1 tablet (1 mg total) by mouth 2 (two) times daily as needed for anxiety.  Dispense: 60 tablet; Refill: 2  7. Diverticulosis of sigmoid colon Watch diet to avoid flare up  8. Attention deficit hyperactivity disorder (ADHD), predominantly inattentive type Stress management  9. Body mass index 31.0-31.9, adult Discussed diet and exercise for person with BMI >25 Will recheck weight in 3-6 months    Labs pending Health  Maintenance reviewed Diet and exercise encouraged  Follow up plan: 6 months   Mary-Margaret Hassell Done, FNP

## 2020-09-21 NOTE — Patient Instructions (Signed)

## 2020-09-22 LAB — CBC WITH DIFFERENTIAL/PLATELET
Basophils Absolute: 0.1 10*3/uL (ref 0.0–0.2)
Basos: 1 %
EOS (ABSOLUTE): 0.2 10*3/uL (ref 0.0–0.4)
Eos: 2 %
Hematocrit: 46.5 % (ref 34.0–46.6)
Hemoglobin: 15.7 g/dL (ref 11.1–15.9)
Immature Grans (Abs): 0 10*3/uL (ref 0.0–0.1)
Immature Granulocytes: 0 %
Lymphocytes Absolute: 2.4 10*3/uL (ref 0.7–3.1)
Lymphs: 30 %
MCH: 29.8 pg (ref 26.6–33.0)
MCHC: 33.8 g/dL (ref 31.5–35.7)
MCV: 88 fL (ref 79–97)
Monocytes Absolute: 0.7 10*3/uL (ref 0.1–0.9)
Monocytes: 9 %
Neutrophils Absolute: 4.5 10*3/uL (ref 1.4–7.0)
Neutrophils: 58 %
Platelets: 361 10*3/uL (ref 150–450)
RBC: 5.26 x10E6/uL (ref 3.77–5.28)
RDW: 13.1 % (ref 11.7–15.4)
WBC: 7.8 10*3/uL (ref 3.4–10.8)

## 2020-09-22 LAB — LIPID PANEL
Chol/HDL Ratio: 3.6 ratio (ref 0.0–4.4)
Cholesterol, Total: 181 mg/dL (ref 100–199)
HDL: 50 mg/dL (ref >39)
LDL Chol Calc (NIH): 116 mg/dL — ABNORMAL HIGH (ref 0–99)
Triglycerides: 80 mg/dL (ref 0–149)
VLDL Cholesterol Cal: 15 mg/dL (ref 5–40)

## 2020-09-22 LAB — CMP14+EGFR
ALT: 21 IU/L (ref 0–32)
AST: 21 IU/L (ref 0–40)
Albumin/Globulin Ratio: 1.7 (ref 1.2–2.2)
Albumin: 4.3 g/dL (ref 3.8–4.9)
Alkaline Phosphatase: 98 IU/L (ref 44–121)
BUN/Creatinine Ratio: 19 (ref 9–23)
BUN: 18 mg/dL (ref 6–24)
Bilirubin Total: 0.5 mg/dL (ref 0.0–1.2)
CO2: 22 mmol/L (ref 20–29)
Calcium: 9.4 mg/dL (ref 8.7–10.2)
Chloride: 103 mmol/L (ref 96–106)
Creatinine, Ser: 0.96 mg/dL (ref 0.57–1.00)
GFR calc Af Amer: 75 mL/min/{1.73_m2} (ref >59)
GFR calc non Af Amer: 65 mL/min/{1.73_m2} (ref >59)
Globulin, Total: 2.5 g/dL (ref 1.5–4.5)
Glucose: 118 mg/dL — ABNORMAL HIGH (ref 65–99)
Potassium: 4.4 mmol/L (ref 3.5–5.2)
Sodium: 141 mmol/L (ref 134–144)
Total Protein: 6.8 g/dL (ref 6.0–8.5)

## 2020-10-04 ENCOUNTER — Telehealth: Payer: Self-pay

## 2020-10-15 ENCOUNTER — Telehealth: Payer: Self-pay

## 2020-10-15 DIAGNOSIS — G43809 Other migraine, not intractable, without status migrainosus: Secondary | ICD-10-CM

## 2020-10-15 MED ORDER — SUMATRIPTAN SUCCINATE 100 MG PO TABS: ORAL_TABLET | ORAL | 11 refills | Status: DC

## 2020-10-15 NOTE — Telephone Encounter (Signed)
Called [facility] Re: Sumatriptan, they have the Rx as #30, not sure why it was filled as #9 last time  Call regarding Bupropion at 09/21/20 visit was discussed about increasing dose, she was previously on 150 mg 3x daily, new Rx is for 300 mg daily. Pt thought she was to be on 600mg  Please advise.

## 2020-10-25 ENCOUNTER — Ambulatory Visit: Payer: 59 | Admitting: Nurse Practitioner

## 2020-12-10 ENCOUNTER — Other Ambulatory Visit: Payer: Self-pay | Admitting: Nurse Practitioner

## 2020-12-13 ENCOUNTER — Encounter: Payer: Self-pay | Admitting: Nurse Practitioner

## 2020-12-13 ENCOUNTER — Ambulatory Visit: Payer: 59 | Admitting: Nurse Practitioner

## 2020-12-13 ENCOUNTER — Other Ambulatory Visit: Payer: Self-pay

## 2020-12-13 VITALS — BP 130/87 | HR 83 | Temp 97.9°F | Resp 20 | Ht 64.0 in | Wt 243.0 lb

## 2020-12-13 DIAGNOSIS — K5730 Diverticulosis of large intestine without perforation or abscess without bleeding: Secondary | ICD-10-CM

## 2020-12-13 MED ORDER — CIPROFLOXACIN HCL 500 MG PO TABS: 500.0000 mg | ORAL_TABLET | Freq: Two times a day (BID) | ORAL | 0 refills | Status: DC

## 2020-12-13 MED ORDER — METRONIDAZOLE 500 MG PO TABS
500.0000 mg | ORAL_TABLET | Freq: Two times a day (BID) | ORAL | 0 refills | Status: DC
Start: 2020-12-13 — End: 2020-12-31

## 2020-12-13 NOTE — Patient Instructions (Signed)
Diverticulitis  Diverticulitis is when small pouches in your colon (large intestine) get infected or swollen. This causes pain in the belly (abdomen) and watery poop (diarrhea). These pouches are called diverticula. The pouches form in people who have a condition called diverticulosis. What are the causes? This condition may be caused by poop (stool) that gets trapped in the pouches in your colon. The poop lets germs (bacteria) grow in the pouches. This causes the infection. What increases the risk? You are more likely to get this condition if you have small pouches in your colon. The risk is higher if:  You are overweight or very overweight (obese).  You do not exercise enough.  You drink alcohol.  You smoke or use products with tobacco in them.  You eat a diet that has a lot of red meat such as beef, pork, or lamb.  You eat a diet that does not have enough fiber in it.  You are older than 59 years of age. What are the signs or symptoms?  Pain in the belly. Pain is often on the left side, but it may be in other areas.  Fever and feeling cold.  Feeling like you may vomit.  Vomiting.  Having cramps.  Feeling full.  Changes to how often you poop.  Blood in your poop. How is this treated? Most cases are treated at home by:  Taking over-the-counter pain medicines.  Following a clear liquid diet.  Taking antibiotic medicines.  Resting. Very bad cases may need to be treated at a hospital. This may include:  Not eating or drinking.  Taking prescription pain medicine.  Getting antibiotic medicines through an IV tube.  Getting fluid and food through an IV tube.  Having surgery. When you are feeling better, your doctor may tell you to have a test to check your colon (colonoscopy). Follow these instructions at home: Medicines  Take over-the-counter and prescription medicines only as told by your doctor. These include: ? Antibiotics. ? Pain medicines. ? Fiber  pills. ? Probiotics. ? Stool softeners.  If you were prescribed an antibiotic medicine, take it as told by your doctor. Do not stop taking the antibiotic even if you start to feel better.  Ask your doctor if the medicine prescribed to you requires you to avoid driving or using machinery. Eating and drinking  Follow a diet as told by your doctor.  When you feel better, your doctor may tell you to change your diet. You may need to eat a lot of fiber. Fiber makes it easier to poop (have a bowel movement). Foods with fiber include: ? Berries. ? Beans. ? Lentils. ? Green vegetables.  Avoid eating red meat.   General instructions  Do not use any products that contain nicotine or tobacco, such as cigarettes, e-cigarettes, and chewing tobacco. If you need help quitting, ask your doctor.  Exercise 3 or more times a week. Try to get 30 minutes each time. Exercise enough to sweat and make your heart beat faster.  Keep all follow-up visits as told by your doctor. This is important. Contact a doctor if:  Your pain does not get better.  You are not pooping like normal. Get help right away if:  Your pain gets worse.  Your symptoms do not get better.  Your symptoms get worse very fast.  You have a fever.  You vomit more than one time.  You have poop that is: ? Bloody. ? Black. ? Tarry. Summary  This condition happens when   small pouches in your colon get infected or swollen.  Take medicines only as told by your doctor.  Follow a diet as told by your doctor.  Keep all follow-up visits as told by your doctor. This is important. This information is not intended to replace advice given to you by your health care provider. Make sure you discuss any questions you have with your health care provider. Document Revised: 04/25/2019 Document Reviewed: 04/25/2019 Elsevier Patient Education  2021 Elsevier Inc.  

## 2020-12-13 NOTE — Progress Notes (Signed)
   Subjective:    Patient ID: Deanna [name], female    DOB: 08-26-1960, 60 y.o.   MRN: [id number]   Chief Complaint: abdominal tenderness   HPI Patient come sin c/o of flare up of diverticulitis. She is having surgery on May 31 for diverticuitis. She started having abdominal pain like she always has when she has pain. Needs not get cleared up for surgery.   Review of Systems  Respiratory: Negative.   Cardiovascular: Negative.   Gastrointestinal: Positive for abdominal pain. Negative for constipation and diarrhea.  Neurological: Negative.   Psychiatric/Behavioral: Negative.   All other systems reviewed and are negative.      Objective:   Physical Exam Vitals and nursing note reviewed.  Constitutional:      Appearance: Normal appearance.  Cardiovascular:     Rate and Rhythm: Normal rate and regular rhythm.     Heart sounds: Normal heart sounds.  Pulmonary:     Effort: Pulmonary effort is normal.     Breath sounds: Normal breath sounds.  Abdominal:     General: Bowel sounds are normal.     Palpations: Abdomen is soft.     Tenderness: There is abdominal tenderness (LLQ). There is no rebound.     Hernia: No hernia is present.  Skin:    General: Skin is warm.  Neurological:     General: No focal deficit present.     Mental Status: She is alert and oriented to person, place, and time.  Psychiatric:        Mood and Affect: Mood normal.        Behavior: Behavior normal.    BP 130/87   Pulse 83   Temp 97.9 F (36.6 C) (Temporal)   Resp 20   Ht 5\' 4"  (1.626 m)   Wt 243 lb (110.2 kg)   SpO2 95%   BMI 41.71 kg/m         Assessment & Plan:  Deanna [name] in today with chief complaint of abdominal tenderness   1. Diverticulosis of sigmoid colon Watch diet Let surgeon know on antibiotics - ciprofloxacin (CIPRO) 500 MG tablet; Take 1 tablet (500 mg total) by mouth 2 (two) times daily.  Dispense: 14 tablet; Refill: 0 - metroNIDAZOLE (FLAGYL) 500 MG tablet; Take  1 tablet (500 mg total) by mouth 2 (two) times daily.  Dispense: 14 tablet; Refill: 0    The above assessment and management plan was discussed with the patient. The patient verbalized understanding of and has agreed to the management plan. Patient is aware to call the clinic if symptoms persist or worsen. Patient is aware when to return to the clinic for a follow-up visit. Patient educated on when it is appropriate to go to the emergency department.   Mary-Margaret Hassell Done, FNP

## 2020-12-19 ENCOUNTER — Telehealth: Payer: Self-pay | Admitting: Nurse Practitioner

## 2020-12-19 NOTE — Telephone Encounter (Signed)
Yes that is fine to go ahead and do the work note

## 2020-12-19 NOTE — Telephone Encounter (Signed)
Letter written - sent to [place]

## 2020-12-19 NOTE — Telephone Encounter (Signed)
Covering PCP - please advise if note it okay to write

## 2020-12-31 ENCOUNTER — Telehealth: Payer: Self-pay | Admitting: Nurse Practitioner

## 2020-12-31 NOTE — Telephone Encounter (Signed)
Transition Care Management Follow-up Telephone Call  Date of discharge and from where: [facility] 12/28/20  Diagnosis: colectomy  How have you been since you were released from the hospital? Stable, very tired and weak, sore, not doing much, can walk very short distances, eating fine, appetite increasing slowly  Any questions or concerns? No  Items Reviewed:  Did the pt receive and understand the discharge instructions provided? Yes   Medications obtained and verified? Yes   Other? No   Any new allergies since your discharge? No   Dietary orders reviewed? Yes  Do you have support at home? Yes   Home Care and Equipment/Supplies: Were home health services ordered? no Were any new equipment or medical supplies ordered?  No  Functional Questionnaire: (I = Independent and D = Dependent) ADLs: I  Bathing/Dressing- I  Meal Prep- D  Eating- I  Maintaining continence- I  Transferring/Ambulation- I  Managing Meds- I  Follow up appointments reviewed:   PCP Hospital f/u appt confirmed? Yes  Scheduled to see Shelah Lewandowsky on 6/17 @ 9.  [facility] f/u appt confirmed? Yes  Scheduled to see GI in the next couple weeks  Are transportation arrangements needed? No   If their condition worsens, is the pt aware to call PCP or go to the Emergency Dept.? Yes  Was the patient provided with contact information for the PCP's office or ED? Yes  Was to pt encouraged to call back with questions or concerns? Yes

## 2021-01-11 ENCOUNTER — Ambulatory Visit: Payer: 59 | Admitting: Nurse Practitioner

## 2021-02-13 ENCOUNTER — Other Ambulatory Visit: Payer: Self-pay | Admitting: Nurse Practitioner

## 2021-02-13 DIAGNOSIS — F411 Generalized anxiety disorder: Secondary | ICD-10-CM

## 2021-03-21 ENCOUNTER — Ambulatory Visit (INDEPENDENT_AMBULATORY_CARE_PROVIDER_SITE_OTHER): Payer: 59 | Admitting: Nurse Practitioner

## 2021-03-21 ENCOUNTER — Other Ambulatory Visit: Payer: Self-pay

## 2021-03-21 ENCOUNTER — Encounter: Payer: Self-pay | Admitting: Nurse Practitioner

## 2021-03-21 VITALS — BP 132/80 | HR 73 | Temp 98.1°F | Resp 20 | Ht 64.0 in | Wt 240.0 lb

## 2021-03-21 DIAGNOSIS — K219 Gastro-esophageal reflux disease without esophagitis: Secondary | ICD-10-CM

## 2021-03-21 DIAGNOSIS — K5730 Diverticulosis of large intestine without perforation or abscess without bleeding: Secondary | ICD-10-CM

## 2021-03-21 DIAGNOSIS — I10 Essential (primary) hypertension: Secondary | ICD-10-CM | POA: Diagnosis not present

## 2021-03-21 DIAGNOSIS — J4530 Mild persistent asthma, uncomplicated: Secondary | ICD-10-CM

## 2021-03-21 DIAGNOSIS — F320 Major depressive disorder, single episode, mild: Secondary | ICD-10-CM

## 2021-03-21 DIAGNOSIS — F411 Generalized anxiety disorder: Secondary | ICD-10-CM

## 2021-03-21 DIAGNOSIS — Z6841 Body Mass Index (BMI) 40.0 and over, adult: Secondary | ICD-10-CM

## 2021-03-21 LAB — CMP14+EGFR
ALT: 24 IU/L (ref 0–32)
AST: 23 IU/L (ref 0–40)
Albumin/Globulin Ratio: 1.9 (ref 1.2–2.2)
Albumin: 4.3 g/dL (ref 3.8–4.9)
Alkaline Phosphatase: 88 IU/L (ref 44–121)
BUN/Creatinine Ratio: 22 (ref 9–23)
BUN: 20 mg/dL (ref 6–24)
Bilirubin Total: <0.2 mg/dL (ref 0.0–1.2)
CO2: 21 mmol/L (ref 20–29)
Calcium: 9 mg/dL (ref 8.7–10.2)
Chloride: 105 mmol/L (ref 96–106)
Creatinine, Ser: 0.9 mg/dL (ref 0.57–1.00)
Globulin, Total: 2.3 g/dL (ref 1.5–4.5)
Glucose: 110 mg/dL — ABNORMAL HIGH (ref 65–99)
Potassium: 4.4 mmol/L (ref 3.5–5.2)
Sodium: 141 mmol/L (ref 134–144)
Total Protein: 6.6 g/dL (ref 6.0–8.5)
eGFR: 74 mL/min/{1.73_m2} (ref >59)

## 2021-03-21 LAB — CBC WITH DIFFERENTIAL/PLATELET
Basophils Absolute: 0.1 10*3/uL (ref 0.0–0.2)
Basos: 1 %
EOS (ABSOLUTE): 0.3 10*3/uL (ref 0.0–0.4)
Eos: 4 %
Hematocrit: 44.9 % (ref 34.0–46.6)
Hemoglobin: 14.9 g/dL (ref 11.1–15.9)
Immature Grans (Abs): 0 10*3/uL (ref 0.0–0.1)
Immature Granulocytes: 0 %
Lymphocytes Absolute: 2.8 10*3/uL (ref 0.7–3.1)
Lymphs: 36 %
MCH: 29.1 pg (ref 26.6–33.0)
MCHC: 33.2 g/dL (ref 31.5–35.7)
MCV: 88 fL (ref 79–97)
Monocytes Absolute: 0.7 10*3/uL (ref 0.1–0.9)
Monocytes: 9 %
Neutrophils Absolute: 3.9 10*3/uL (ref 1.4–7.0)
Neutrophils: 50 %
Platelets: 374 10*3/uL (ref 150–450)
RBC: 5.12 x10E6/uL (ref 3.77–5.28)
RDW: 12.7 % (ref 11.7–15.4)
WBC: 7.8 10*3/uL (ref 3.4–10.8)

## 2021-03-21 LAB — LIPID PANEL
Chol/HDL Ratio: 4.4 ratio (ref 0.0–4.4)
Cholesterol, Total: 189 mg/dL (ref 100–199)
HDL: 43 mg/dL (ref >39)
LDL Chol Calc (NIH): 121 mg/dL — ABNORMAL HIGH (ref 0–99)
Triglycerides: 141 mg/dL (ref 0–149)
VLDL Cholesterol Cal: 25 mg/dL (ref 5–40)

## 2021-03-21 MED ORDER — OLMESARTAN MEDOXOMIL 40 MG PO TABS: 40.0000 mg | ORAL_TABLET | Freq: Every morning | ORAL | 1 refills | Status: DC

## 2021-03-21 MED ORDER — FLUTICASONE-SALMETEROL 250-50 MCG/ACT IN AEPB: 1.0000 | INHALATION_SPRAY | Freq: Two times a day (BID) | RESPIRATORY_TRACT | 1 refills | Status: DC

## 2021-03-21 MED ORDER — ALPRAZOLAM 1 MG PO TABS
1.0000 mg | ORAL_TABLET | Freq: Two times a day (BID) | ORAL | 5 refills | Status: DC
Start: 2021-03-21 — End: 2021-10-04

## 2021-03-21 MED ORDER — AMLODIPINE BESYLATE 5 MG PO TABS: 5.0000 mg | ORAL_TABLET | Freq: Every day | ORAL | 1 refills | Status: DC

## 2021-03-21 MED ORDER — FUROSEMIDE 20 MG PO TABS: 20.0000 mg | ORAL_TABLET | Freq: Every day | ORAL | 1 refills | Status: DC

## 2021-03-21 MED ORDER — BUPROPION HCL ER (XL) 300 MG PO TB24: 300.0000 mg | ORAL_TABLET | Freq: Every day | ORAL | 1 refills | Status: DC

## 2021-03-21 MED ORDER — OMEPRAZOLE 40 MG PO CPDR: 40.0000 mg | DELAYED_RELEASE_CAPSULE | Freq: Every day | ORAL | 1 refills | Status: DC

## 2021-03-21 NOTE — Addendum Note (Signed)
Addended by: Chevis Pretty on: 03/21/2021 08:48 AM   Modules accepted: Orders

## 2021-03-21 NOTE — Progress Notes (Signed)
Subjective:    Patient ID: Deanna [name], female    DOB: [birth year], 60 y.o.   MRN: [id number]   Chief Complaint: medical management of chronic issues     HPI:  1. Essential hypertension, benign No c/o chest pain, sob or headache. Does not check blood preesure at home. BP Readings from Last 3 Encounters:  12/13/20 130/87  09/21/20 132/77  02/08/20 132/83     2. Gastroesophageal reflux disease without esophagitis Is on omeprazole daily and is doing well.  3. Diverticulosis of sigmoid colon Denies any recent flare ups  4. Depression, major, single episode, mild ([place]) Is on wellbutrin daily. Is doing well. Depression screen [facility] 2/9 03/21/2021 12/13/2020 09/21/2020  Decreased Interest 1 0 0  Down, Depressed, Hopeless 0 0 0  PHQ - 2 Score 1 0 0  Altered sleeping 2 - -  Tired, decreased energy 1 - -  Change in appetite 2 - -  Feeling bad or failure about yourself  1 - -  Trouble concentrating 2 - -  Moving slowly or fidgety/restless 0 - -  Suicidal thoughts 0 - -  PHQ-9 Score 9 - -  Difficult doing work/chores Not difficult at all - -  Some recent data might be hidden     5. GAD (generalized anxiety disorder) Is on xanax BID GAD 7 : Generalized Anxiety Score 03/21/2021 09/21/2020 02/08/2020 11/07/2019  Nervous, Anxious, on Edge '1 2 1 1  '$ Control/stop worrying 1 0 3 1  Worry too much - different things '1 1 3 1  '$ Trouble relaxing 0 0 1 0  Restless 0 0 0 0  Easily annoyed or irritable 1 2 0 1  Afraid - awful might happen 0 0 0 0  Total GAD 7 Score '4 5 8 4  '$ Anxiety Difficulty Not difficult at all Not difficult at all Not difficult at all -      6. Asthma Is on singulair and advair daily. She denies any SOB.   7. Body mass index 41.0-41.9, adult No recent weight changes Wt Readings from Last 3 Encounters:  03/21/21 240 lb (108.9 kg)  12/13/20 243 lb (110.2 kg)  09/21/20 252 lb 3.2 oz (114.4 kg)   BMI Readings from Last 3 Encounters:  03/21/21 41.20 kg/m   12/13/20 41.71 kg/m  09/21/20 43.29 kg/m       Outpatient Encounter Medications as of 03/21/2021  Medication Sig   ADVAIR DISKUS 250-50 MCG/DOSE AEPB Inhale 1 puff into the lungs daily.   ALPRAZolam (XANAX) 1 MG tablet TAKE 1 TABLET TWICE DAILY AS NEEDED FOR ANXIETY .   amLODipine (NORVASC) 5 MG tablet Take 1 tablet (5 mg total) by mouth daily.   baclofen (LIORESAL) 10 MG tablet 1 p.o. before supper and before bed (Patient not taking: Reported on 12/13/2020)   buPROPion (WELLBUTRIN XL) 300 MG 24 hr tablet Take 1 tablet (300 mg total) by mouth daily.   CALCIUM-VITAMIN D PO Take 1 tablet by mouth. 600 mg of calcium   fluticasone (FLONASE) 50 MCG/ACT nasal spray Place 2 sprays into both nostrils daily.   furosemide (LASIX) 20 MG tablet Take 1 tablet (20 mg total) by mouth daily. (Needs to be seen before next refill)   ketorolac (TORADOL) 10 MG tablet Take 10 mg by mouth every 6 (six) hours as needed.   Lactobacillus (ACIDOPHILUS) CAPS capsule Take 1 capsule by mouth daily.   montelukast (SINGULAIR) 10 MG tablet TAKE 1 TABLET ONCE DAILY IN THE EVENING.   Multiple  Vitamin (MULTIVITAMIN) tablet Take 1 tablet by mouth daily.   olmesartan (BENICAR) 40 MG tablet Take 1 tablet (40 mg total) by mouth every morning. (Needs to be seen before next refill)   omeprazole (PRILOSEC) 40 MG capsule Take 1 capsule (40 mg total) by mouth daily.   ondansetron (ZOFRAN-ODT) 4 MG disintegrating tablet Take 4 mg by mouth every 8 (eight) hours as needed.   Probiotic Product (MEGA PROBIOTIC PO) Take 1 capsule by mouth daily.   SUMAtriptan (IMITREX) 100 MG tablet TAKE 1 TABLET ONCE DAILY AS NEEDED FOR MIGRAINE AS DIRECTED BY PHYSICIAN.   valACYclovir (VALTREX) 1000 MG tablet TAKE  (1)  TABLET  EVERY TWELVE HOURS.   VENTOLIN HFA 108 (90 Base) MCG/ACT inhaler Inhale 2 puffs into the lungs every 6 (six) hours as needed for wheezing or shortness of breath.   No facility-administered encounter medications on file as of  03/21/2021.    Past Surgical History:  Procedure Laterality Date   ABDOMINAL HYSTERECTOMY  02/2010   partial   BREAST REDUCTION SURGERY  02/2011   BREAST SURGERY     cancer   CHOLECYSTECTOMY  07/17/11   w/IOC   COLONOSCOPY     NASAL SINUS SURGERY  02/2011   TONSILLECTOMY  age 64    Family History  Problem Relation Age of Onset   Mental illness Mother    Stroke Mother        died with this at age 46   Heart disease Mother    Hyperlipidemia Mother    Alcohol abuse Father    Cancer Paternal Grandmother        colon   Colon cancer Paternal Grandmother    Alcohol abuse Brother    Asthma Brother    Hypertension Brother    Esophageal cancer Neg Hx    Rectal cancer Neg Hx    Stomach cancer Neg Hx     New complaints: None today  Social history: Lives with Deanna [name] her boyfriend  Controlled substance contract: n/a     Review of Systems  Constitutional:  Negative for diaphoresis.  Eyes:  Negative for pain.  Respiratory:  Negative for shortness of breath.   Cardiovascular:  Negative for chest pain, palpitations and leg swelling.  Gastrointestinal:  Negative for abdominal pain.  Endocrine: Negative for polydipsia.  Skin:  Negative for rash.  Neurological:  Negative for dizziness, weakness and headaches.  Hematological:  Does not bruise/bleed easily.  All other systems reviewed and are negative.     Objective:   Physical Exam Vitals and nursing note reviewed.  Constitutional:      General: She is not in acute distress.    Appearance: Normal appearance. She is well-developed.  HENT:     Head: Normocephalic.     Right Ear: Tympanic membrane normal.     Left Ear: Tympanic membrane normal.     Nose: Nose normal.     Mouth/Throat:     Mouth: Mucous membranes are moist.  Eyes:     Pupils: Pupils are equal, round, and reactive to light.  Neck:     Vascular: No carotid bruit or JVD.  Cardiovascular:     Rate and Rhythm: Normal rate and regular rhythm.     Heart  sounds: Normal heart sounds.  Pulmonary:     Effort: Pulmonary effort is normal. No respiratory distress.     Breath sounds: Normal breath sounds. No wheezing or rales.  Chest:     Chest wall: No tenderness.  Abdominal:  General: Bowel sounds are normal. There is no distension or abdominal bruit.     Palpations: Abdomen is soft. There is no hepatomegaly, splenomegaly, mass or pulsatile mass.     Tenderness: There is no abdominal tenderness.  Musculoskeletal:        General: Normal range of motion.     Cervical back: Normal range of motion and neck supple.  Lymphadenopathy:     Cervical: No cervical adenopathy.  Skin:    General: Skin is warm and dry.  Neurological:     Mental Status: She is alert and oriented to person, place, and time.     Deep Tendon Reflexes: Reflexes are normal and symmetric.  Psychiatric:        Behavior: Behavior normal.        Thought Content: Thought content normal.        Judgment: Judgment normal.    BP 132/80   Pulse 73   Temp 98.1 F (36.7 C) (Temporal)   Resp 20   Ht '5\' 4"'$  (1.626 m)   Wt 240 lb (108.9 kg)   SpO2 97%   BMI 41.20 kg/m        Assessment & Plan:  KIYOMI MATTY comes in today with chief complaint of Medical Management of Chronic Issues (Sinus pressure/)   Diagnosis and orders addressed:  1. Essential hypertension, benign Low sodium diet - amLODipine (NORVASC) 5 MG tablet; Take 1 tablet (5 mg total) by mouth daily.  Dispense: 90 tablet; Refill: 1 - olmesartan (BENICAR) 40 MG tablet; Take 1 tablet (40 mg total) by mouth every morning. (Needs to be seen before next refill)  Dispense: 90 tablet; Refill: 1 - furosemide (LASIX) 20 MG tablet; Take 1 tablet (20 mg total) by mouth daily. (Needs to be seen before next refill)  Dispense: 90 tablet; Refill: 1  2. Gastroesophageal reflux disease without esophagitis Avoid spicy foods Do not eat 2 hours prior to bedtime - omeprazole (PRILOSEC) 40 MG capsule; Take 1 capsule (40 mg  total) by mouth daily.  Dispense: 180 capsule; Refill: 1  3. Diverticulosis of sigmoid colon Watch diet to prevent flare up  4. Depression, major, single episode, mild ([facility]) Stress management - buPROPion (WELLBUTRIN XL) 300 MG 24 hr tablet; Take 1 tablet (300 mg total) by mouth daily.  Dispense: 90 tablet; Refill: 1  5. GAD (generalized anxiety disorder) - ALPRAZolam (XANAX) 1 MG tablet; Take 1 tablet (1 mg total) by mouth 2 (two) times daily.  Dispense: 60 tablet; Refill: 5  6. BMI 40.0-44.9, adult ([place]) Discussed diet and exercise for person with BMI >25 Will recheck weight in 3-6 months Wants appointment with clinical pharmacist   7. Mild persistent asthma without complication - fluticasone-salmeterol (ADVAIR DISKUS) 250-50 MCG/ACT AEPB; Inhale 1 puff into the lungs in the morning and at bedtime.  Dispense: 180 each; Refill: 1   Labs pending Health Maintenance reviewed Diet and exercise encouraged  Follow up plan: 6 months   Mary-Margaret Hassell Done, FNP

## 2021-03-21 NOTE — Patient Instructions (Signed)

## 2021-04-18 ENCOUNTER — Other Ambulatory Visit: Payer: Self-pay

## 2021-04-18 ENCOUNTER — Ambulatory Visit (INDEPENDENT_AMBULATORY_CARE_PROVIDER_SITE_OTHER): Payer: 59 | Admitting: Pharmacist

## 2021-04-18 VITALS — Ht 64.0 in | Wt 240.0 lb

## 2021-04-18 DIAGNOSIS — R635 Abnormal weight gain: Secondary | ICD-10-CM | POA: Diagnosis not present

## 2021-04-18 DIAGNOSIS — Z6841 Body Mass Index (BMI) 40.0 and over, adult: Secondary | ICD-10-CM | POA: Diagnosis not present

## 2021-04-18 MED ORDER — SAXENDA 18 MG/3ML ~~LOC~~ SOPN: 3.0000 mg | PEN_INJECTOR | Freq: Every day | SUBCUTANEOUS | 3 refills | Status: DC

## 2021-04-18 NOTE — Progress Notes (Signed)
  04/18/21 Name: Deanna [name] MRN: [id number] DOB: 08-May-1961  S:  28 yoF Presents for weight loss evaluation, education, and management. Patient would like medication to assist in her weight loss endeavors. She is motivated to lose weight for her overall health & wellness.   Insurance coverage/medication affordability: [employer]    Patient reports adherence with medications. Current medications for weight loss: n/a   Patient is active during the day. Motivated to work out   She reports she has difficulty with snacking/carbs   Discussed meal planning options and Plate method for healthy eating Avoid sugary drinks and desserts Incorporate balanced protein, non starchy veggies, 1 serving of carbohydrate with each meal Increase water intake Increase physical activity as able   Weight 240lb, BMI 40     A/P:   -Healthy eating and meal planning discussed   -Increase water and exercise   -START Saxenda (pending insurance)  Initiate 0.6mg  daily for 1 week, then increased by 0.6mg  every week as tolerated until max dose on 3mg  daily is reached  Patient denies history of thyroid/medullary cancer.             Work to eat low fat smaller meals to reduce side effects             Decreased carbonated beverages   Written patient instructions provided.  Total time in face to face counseling 25 minutes.   Regina Eck, PharmD, BCPS Clinical Pharmacist, [place]  [facility] Phone [phone]

## 2021-04-24 ENCOUNTER — Telehealth: Payer: Self-pay | Admitting: Pharmacist

## 2021-04-24 DIAGNOSIS — Z6841 Body Mass Index (BMI) 40.0 and over, adult: Secondary | ICD-10-CM

## 2021-04-25 NOTE — Telephone Encounter (Signed)
Hey insurance plan will not cover any antiobesity meds  (even with appeal)  The only thing affordable would be phentermine unfortunately

## 2021-04-25 NOTE — Telephone Encounter (Signed)
Please let patient know and see if she wants to do phenteramine

## 2021-04-25 NOTE — Telephone Encounter (Signed)
Your prior authorization for Kirke Shaggy has been denied. RETURN TO DASHBOARD When applicable, information about how to complete an appeal for this patient will be sent to you. Please also see the determination letter provided by the payer/PBM for more information.  Message from plan: Request Reference Number: [id number]. SAXENDA INJ 18MG /3ML is denied for not meeting the prior authorization requirement(s). Details of this decision are in the notice attached below or have been faxed to you.

## 2021-04-25 NOTE — Telephone Encounter (Signed)
Saxeneda denied- what is next

## 2021-05-07 NOTE — Telephone Encounter (Signed)
Attempts to contact pt without return call in over 3 days, will close encounter. 

## 2021-07-17 ENCOUNTER — Other Ambulatory Visit: Payer: Self-pay | Admitting: Nurse Practitioner

## 2021-09-03 ENCOUNTER — Ambulatory Visit: Payer: 59 | Admitting: Pharmacist

## 2021-09-19 ENCOUNTER — Ambulatory Visit: Payer: 59 | Admitting: Nurse Practitioner

## 2021-10-04 ENCOUNTER — Encounter: Payer: Self-pay | Admitting: Nurse Practitioner

## 2021-10-04 ENCOUNTER — Ambulatory Visit: Payer: 59 | Admitting: Nurse Practitioner

## 2021-10-04 VITALS — BP 139/82 | HR 75 | Temp 98.1°F | Resp 20 | Ht 64.0 in | Wt 250.0 lb

## 2021-10-04 DIAGNOSIS — Z6841 Body Mass Index (BMI) 40.0 and over, adult: Secondary | ICD-10-CM

## 2021-10-04 DIAGNOSIS — G43809 Other migraine, not intractable, without status migrainosus: Secondary | ICD-10-CM

## 2021-10-04 DIAGNOSIS — K5730 Diverticulosis of large intestine without perforation or abscess without bleeding: Secondary | ICD-10-CM

## 2021-10-04 DIAGNOSIS — K219 Gastro-esophageal reflux disease without esophagitis: Secondary | ICD-10-CM

## 2021-10-04 DIAGNOSIS — J4530 Mild persistent asthma, uncomplicated: Secondary | ICD-10-CM

## 2021-10-04 DIAGNOSIS — F320 Major depressive disorder, single episode, mild: Secondary | ICD-10-CM | POA: Diagnosis not present

## 2021-10-04 DIAGNOSIS — I10 Essential (primary) hypertension: Secondary | ICD-10-CM

## 2021-10-04 DIAGNOSIS — F900 Attention-deficit hyperactivity disorder, predominantly inattentive type: Secondary | ICD-10-CM

## 2021-10-04 DIAGNOSIS — F411 Generalized anxiety disorder: Secondary | ICD-10-CM

## 2021-10-04 MED ORDER — FUROSEMIDE 20 MG PO TABS: 20.0000 mg | ORAL_TABLET | Freq: Every day | ORAL | 1 refills | Status: DC

## 2021-10-04 MED ORDER — WEGOVY 0.25 MG/0.5ML ~~LOC~~ SOAJ: 0.2500 mg | SUBCUTANEOUS | 1 refills | Status: DC

## 2021-10-04 MED ORDER — AMLODIPINE BESYLATE 5 MG PO TABS: 5.0000 mg | ORAL_TABLET | Freq: Every day | ORAL | 1 refills | Status: DC

## 2021-10-04 MED ORDER — OLMESARTAN MEDOXOMIL 40 MG PO TABS: 40.0000 mg | ORAL_TABLET | Freq: Every morning | ORAL | 1 refills | Status: DC

## 2021-10-04 MED ORDER — ALPRAZOLAM 1 MG PO TABS: 1.0000 mg | ORAL_TABLET | Freq: Two times a day (BID) | ORAL | 5 refills | Status: DC

## 2021-10-04 MED ORDER — MONTELUKAST SODIUM 10 MG PO TABS: 10.0000 mg | ORAL_TABLET | Freq: Every day | ORAL | 1 refills | Status: DC

## 2021-10-04 MED ORDER — FLUTICASONE-SALMETEROL 250-50 MCG/ACT IN AEPB: 1.0000 | INHALATION_SPRAY | Freq: Two times a day (BID) | RESPIRATORY_TRACT | 1 refills | Status: DC

## 2021-10-04 MED ORDER — SUMATRIPTAN SUCCINATE 100 MG PO TABS: ORAL_TABLET | ORAL | 11 refills | Status: DC

## 2021-10-04 MED ORDER — OMEPRAZOLE 40 MG PO CPDR: 40.0000 mg | DELAYED_RELEASE_CAPSULE | Freq: Every day | ORAL | 1 refills | Status: DC

## 2021-10-04 MED ORDER — BUPROPION HCL ER (XL) 150 MG PO TB24: 450.0000 mg | ORAL_TABLET | Freq: Every day | ORAL | 1 refills | Status: DC

## 2021-10-04 NOTE — Progress Notes (Signed)
Subjective:    Patient ID: Deanna [name], female    DOB: 05-Dec-1960, 61 y.o.   MRN: [id number]   Chief Complaint: Medical Management of Chronic Issues    HPI:  Deanna [name] is a 61 y.o. who identifies as a female who was assigned female at birth.   Social history: Lives with: her husband Work history: Production designer, theatre/television/film   Comes in today for follow up of the following chronic medical issues:  1. Essential hypertension, benign No c/o chest pain, sob or headache. Doe snot check blood pressure at home. BP Readings from Last 3 Encounters:  10/04/21 139/82  03/21/21 132/80  12/13/20 130/87     2. Other migraine without status migrainosus, not intractable Has 1-2 a month. Seem to be worse when weather changes. Imitrex works well to stop migraine.  3. Gastroesophageal reflux disease without esophagitis Take omeprazole daily and denies any reflux symptoms.  4. Depression, major, single episode, mild (Deanna [name]) Is on wellbutrin. Is doing ok but would like to increase dose. Use to be on 432m and has been on 3098m [facility] 10/04/2021 03/21/2021 12/13/2020  Decreased Interest 0 1 0  Down, Depressed, Hopeless 1 0 0  PHQ - 2 Score 1 1 0  Altered sleeping 0 2 -  Tired, decreased energy 3 1 -  Change in appetite 3 2 -  Feeling bad or failure about yourself  1 1 -  Trouble concentrating 0 2 -  Moving slowly or fidgety/restless 0 0 -  Suicidal thoughts 0 0 -  PHQ-9 Score 8 9 -  Difficult doing work/chores Not difficult at all Not difficult at all -  Some recent data might be hidden     5. GAD (generalized anxiety disorder) Is on xanax bid. Stays anxious. GAD 7 : Generalized Anxiety Score 10/04/2021 03/21/2021 09/21/2020 02/08/2020  Nervous, Anxious, on Edge 0 _0 Control/stop worrying 1 1 0 3  Worry too much - different things _1 Trouble relaxing 0 0 0 1  Restless 0 0 0 0  Easily annoyed or irritable _2 0  Afraid - awful might happen 0 0 0 0   Total GAD 7 Score _3 Anxiety Difficulty Not difficult at all Not difficult at all Not difficult at all Not difficult at all      6. Diverticulosis of sigmoid colon No recent flare ups  7. Attention deficit hyperactivity disorder (ADHD), predominantly inattentive type Is not on any meds currently. She would rather stya on xana then get ADHD meds.  8. BMI 40.0-44.9, adult ([place] 10 lbs. She tried saxanda ad that caused abdominal pain. Would like to try wegovy. Wt Readings from Last 3 Encounters:  10/04/21 250 lb (113.4 kg)  04/24/21 240 lb (108.9 kg)  03/21/21 240 lb (108.9 kg)   BMI Readings from Last 3 Encounters:  10/04/21 42.91 kg/m  04/24/21 41.20 kg/m  03/21/21 41.20 kg/m       New complaints: None today  Allergies  Allergen Reactions   Iodinated Contrast Media Rash    CARDIAC ARREST    Iodine Hives, Other (See Comments) and Swelling   Iohexol Anaphylaxis    IBD dye   Clindamycin/Lincomycin Nausea And Vomiting   Outpatient Encounter Medications as of 10/04/2021  Medication Sig   ALPRAZolam (XANAX) 1 MG tablet Take 1 tablet (1 mg total) by mouth 2 (two) times daily.   amLODipine (NORVASC)  5 MG tablet Take 1 tablet (5 mg total) by mouth daily.   buPROPion (WELLBUTRIN XL) 300 MG 24 hr tablet Take 1 tablet (300 mg total) by mouth daily.   CALCIUM-VITAMIN D PO Take 1 tablet by mouth. 600 mg of calcium   fluticasone (FLONASE) 50 MCG/ACT nasal spray Place 2 sprays into both nostrils daily.   fluticasone-salmeterol (ADVAIR DISKUS) 250-50 MCG/ACT AEPB Inhale 1 puff into the lungs in the morning and at bedtime.   furosemide (LASIX) 20 MG tablet Take 1 tablet (20 mg total) by mouth daily. (Needs to be seen before next refill)   montelukast (SINGULAIR) 10 MG tablet TAKE 1 TABLET ONCE DAILY IN THE EVENING.   Multiple Vitamin (MULTIVITAMIN) tablet Take 1 tablet by mouth daily.   olmesartan (BENICAR) 40 MG tablet Take 1 tablet (40 mg total) by mouth every  morning. (Needs to be seen before next refill)   omeprazole (PRILOSEC) 40 MG capsule Take 1 capsule (40 mg total) by mouth daily.   SUMAtriptan (IMITREX) 100 MG tablet TAKE 1 TABLET ONCE DAILY AS NEEDED FOR MIGRAINE AS DIRECTED BY PHYSICIAN.   valACYclovir (VALTREX) 1000 MG tablet TAKE  (1)  TABLET  EVERY TWELVE HOURS.   VENTOLIN HFA 108 (90 Base) MCG/ACT inhaler Inhale 2 puffs into the lungs every 6 (six) hours as needed for wheezing or shortness of breath.   [DISCONTINUED] Liraglutide -Weight Management (SAXENDA) 18 MG/3ML SOPN Inject 3 mg into the skin daily.   No facility-administered encounter medications on file as of 10/04/2021.    Past Surgical History:  Procedure Laterality Date   ABDOMINAL HYSTERECTOMY  02/2010   partial   BREAST REDUCTION SURGERY  02/2011   BREAST SURGERY     cancer   CHOLECYSTECTOMY  07/17/11   w/IOC   COLONOSCOPY     NASAL SINUS SURGERY  02/2011   TONSILLECTOMY  age 63    Family History  Problem Relation Age of Onset   Mental illness Mother    Stroke Mother        died with this at age 75   Heart disease Mother    Hyperlipidemia Mother    Alcohol abuse Father    Cancer Paternal Grandmother        colon   Colon cancer Paternal Grandmother    Alcohol abuse Brother    Asthma Brother    Hypertension Brother    Esophageal cancer Neg Hx    Rectal cancer Neg Hx    Stomach cancer Neg Hx       Controlled substance contract: 03/26/21     Review of Systems  Constitutional:  Negative for diaphoresis.  Eyes:  Negative for pain.  Respiratory:  Negative for shortness of breath.   Cardiovascular:  Negative for chest pain, palpitations and leg swelling.  Gastrointestinal:  Negative for abdominal pain.  Endocrine: Negative for polydipsia.  Skin:  Negative for rash.  Neurological:  Negative for dizziness, weakness and headaches.  Hematological:  Does not bruise/bleed easily.  All other systems reviewed and are negative.     Objective:   Physical  Exam Vitals and nursing note reviewed.  Constitutional:      General: She is not in acute distress.    Appearance: Normal appearance. She is well-developed.  HENT:     Head: Normocephalic.     Right Ear: Tympanic membrane normal.     Left Ear: Tympanic membrane normal.     Nose: Nose normal.     Mouth/Throat:  Mouth: Mucous membranes are moist.  Eyes:     Pupils: Pupils are equal, round, and reactive to light.  Neck:     Vascular: No carotid bruit or JVD.  Cardiovascular:     Rate and Rhythm: Normal rate and regular rhythm.     Heart sounds: Normal heart sounds.  Pulmonary:     Effort: Pulmonary effort is normal. No respiratory distress.     Breath sounds: Normal breath sounds. No wheezing or rales.  Chest:     Chest wall: No tenderness.  Abdominal:     General: Bowel sounds are normal. There is no distension or abdominal bruit.     Palpations: Abdomen is soft. There is no hepatomegaly, splenomegaly, mass or pulsatile mass.     Tenderness: There is no abdominal tenderness.  Musculoskeletal:        General: Normal range of motion.     Cervical back: Normal range of motion and neck supple.  Lymphadenopathy:     Cervical: No cervical adenopathy.  Skin:    General: Skin is warm and dry.  Neurological:     Mental Status: She is alert and oriented to person, place, and time.     Deep Tendon Reflexes: Reflexes are normal and symmetric.  Psychiatric:        Behavior: Behavior normal.        Thought Content: Thought content normal.        Judgment: Judgment normal.    BP 139/82    Pulse 75    Temp 98.1 F (36.7 C) (Temporal)    Resp 20    Ht _0  (1.626 m)    Wt 250 lb (113.4 kg)    SpO2 98%    BMI 42.91 kg/m        Assessment & Plan:  Deanna [name] comes in today with chief complaint of Medical Management of Chronic Issues   Diagnosis and orders addressed:  1. Essential hypertension, benign Low sodium diet - amLODipine (NORVASC) 5 MG tablet; Take 1 tablet (5  mg total) by mouth daily.  Dispense: 90 tablet; Refill: 1 - olmesartan (BENICAR) 40 MG tablet; Take 1 tablet (40 mg total) by mouth every morning. (Needs to be seen before next refill)  Dispense: 90 tablet; Refill: 1 - furosemide (LASIX) 20 MG tablet; Take 1 tablet (20 mg total) by mouth daily. (Needs to be seen before next refill)  Dispense: 90 tablet; Refill: 1  2. Other migraine without status migrainosus, not intractable Avoid caffeine - SUMAtriptan (IMITREX) 100 MG tablet; TAKE 1 TABLET ONCE DAILY AS NEEDED FOR MIGRAINE AS DIRECTED BY PHYSICIAN.  Dispense: 9 tablet; Refill: 11  3. Gastroesophageal reflux disease without esophagitis Avoid spicy foods Do not eat 2 hours prior to bedtime - omeprazole (PRILOSEC) 40 MG capsule; Take 1 capsule (40 mg total) by mouth daily.  Dispense: 180 capsule; Refill: 1  4. Depression, major, single episode, mild (HCC) Stress management - buPROPion (WELLBUTRIN XL) 150 MG 24 hr tablet; Take 3 tablets (450 mg total) by mouth daily.  Dispense: 270 tablet; Refill: 1  5. GAD (generalized anxiety disorder) - ToxASSURE Select 13 (MW), Urine - ALPRAZolam (XANAX) 1 MG tablet; Take 1 tablet (1 mg total) by mouth 2 (two) times daily.  Dispense: 60 tablet; Refill: 5  6. Diverticulosis of sigmoid colon Watch diet to prevent flare up  7. Attention deficit hyperactivity disorder (ADHD), predominantly inattentive type Stress management  8. BMI 40.0-44.9, adult (HCC) Discussed diet and exercise for person  with BMI >25 Will recheck weight in 3-6 months Going to see if insurance will cover wegovy  - Semaglutide-Weight Management (WEGOVY) 0.25 MG/0.5ML SOAJ; Inject 0.25 mg into the skin once a week.  Dispense: 2 mL; Refill: 1  9. Mild persistent asthma without complication - fluticasone-salmeterol (ADVAIR DISKUS) 250-50 MCG/ACT AEPB; Inhale 1 puff into the lungs in the morning and at bedtime.  Dispense: 180 each; Refill: 1  Orders Placed This Encounter  Procedures    ToxASSURE Select 13 (MW), Urine    Allergies as of 10/04/2021      Reactions   Iodinated Contrast Media Rash   CARDIAC ARREST    Iodine Hives, Other (See Comments), Swelling   Iohexol Anaphylaxis   IBD dye   Clindamycin/lincomycin Nausea And Vomiting      Medication List       Accurate as of October 04, 2021 10:57 AM. If you have any questions, ask  your nurse or doctor.        STOP taking these medications   Saxenda 18 MG/3ML Sopn Generic drug: Liraglutide -Weight Management Stopped by: Chevis Pretty, FNP     TAKE these medications   ALPRAZolam 1 MG tablet Commonly known as: XANAX Take 1 tablet (1 mg total) by mouth 2 (two) times daily.   amLODipine 5 MG tablet Commonly known as: NORVASC Take 1 tablet (5 mg total) by mouth daily.   buPROPion 300 MG 24 hr tablet Commonly known as: Wellbutrin XL Take 1 tablet (300 mg total) by mouth daily.   CALCIUM-VITAMIN D PO Take 1 tablet by mouth. 600 mg of calcium   fluticasone 50 MCG/ACT nasal spray Commonly known as: FLONASE Place 2 sprays into both nostrils daily.   fluticasone-salmeterol 250-50 MCG/ACT Aepb Commonly known as: Advair Diskus Inhale 1 puff into the lungs in the morning and at bedtime.   furosemide 20 MG tablet Commonly known as: LASIX Take 1 tablet (20 mg total) by mouth daily. (Needs to be seen before next  refill)   montelukast 10 MG tablet Commonly known as: SINGULAIR TAKE 1 TABLET ONCE DAILY IN THE EVENING.   multivitamin tablet Take 1 tablet by mouth daily.   olmesartan 40 MG tablet Commonly known as: BENICAR Take 1 tablet (40 mg total) by mouth every morning. (Needs to be seen  before next refill)   omeprazole 40 MG capsule Commonly known as: PRILOSEC Take 1 capsule (40 mg total) by mouth daily.   SUMAtriptan 100 MG tablet Commonly known as: IMITREX TAKE 1 TABLET ONCE DAILY AS NEEDED FOR MIGRAINE AS DIRECTED BY PHYSICIAN.   valACYclovir 1000 MG tablet Commonly known as:  VALTREX TAKE  (1)  TABLET  EVERY TWELVE HOURS.   Ventolin HFA 108 (90 Base) MCG/ACT inhaler Generic drug: albuterol Inhale 2 puffs into the lungs every 6 (six) hours as needed for wheezing  or shortness of breath.   CBC with Differential/Platelet   CMP14+EGFR   Lipid panel    Labs pending Health Maintenance reviewed Diet and exercise encouraged  Follow up plan: 6 months   [place], FNP

## 2021-10-08 ENCOUNTER — Telehealth: Payer: Self-pay | Admitting: Nurse Practitioner

## 2021-10-08 MED ORDER — OZEMPIC (0.25 OR 0.5 MG/DOSE) 2 MG/1.5ML ~~LOC~~ SOPN: 0.5000 mg | PEN_INJECTOR | SUBCUTANEOUS | 1 refills | Status: DC

## 2021-10-08 NOTE — Telephone Encounter (Signed)
Oempic rx sent to pharmacy- start with 0.25weekly for 2 weeks then up to 0.5 weekly ?

## 2021-10-08 NOTE — Telephone Encounter (Signed)
Patient aware and verbalized understanding. °

## 2022-04-02 NOTE — Patient Instructions (Signed)
Our records indicate that you are due for your annual mammogram/breast imaging. While there is no way to prevent breast cancer, early detection provides the best opportunity for curing it. For women over the age of 40, the American Cancer Society recommends a yearly clinical breast exam and a yearly mammogram. These practices have saved thousands of lives. We need your help to ensure that you are receiving optimal medical care. Please call the imaging location that has done you previous mammograms. Please remember to list us as your primary care. This helps make sure we receive a report and can update your chart.  Below is the contact information for several local breast imaging centers. You may call the location that works best for you, and they will be happy to assistance in making you an appointment. You do not need an order for a regular screening mammogram. However, if you are having any problems or concerns with you breast area, please let your primary care provider know, and appropriate orders will be placed. Please let our office know if you have any questions or concerns. Or if you need information for another imaging center not on this list or outside of the area. We are commented to working with you on your health care journey.   The mobile unit/bus ([facility]) - they come twice a month to our location.  These appointments can be made through our office or by call The Breast Center  [facility]  [address] [place], [place] [address] Phone [phone]  [facility] Radiology Department  [address]  [place], [place] [address] [phone]  [facility])  [address] [place], [place] [address] [phone]  [facility] [place]  [address] [place] [address] [phone]  [facility]  [address], [address] [place] [address] [phone]  [facility] in   [address] , [place] [address] [phone]  [facility] [address] [place],  [address] [phone]  [facility] [address]  [address] ,  [address] [phone]  [facility] [address] [place] [address] [phone]     

## 2022-04-04 ENCOUNTER — Other Ambulatory Visit: Payer: Self-pay | Admitting: Nurse Practitioner

## 2022-04-04 DIAGNOSIS — I10 Essential (primary) hypertension: Secondary | ICD-10-CM

## 2022-04-04 DIAGNOSIS — F320 Major depressive disorder, single episode, mild: Secondary | ICD-10-CM

## 2022-04-04 DIAGNOSIS — F411 Generalized anxiety disorder: Secondary | ICD-10-CM

## 2022-04-04 NOTE — Telephone Encounter (Signed)
Has appt next week

## 2022-04-07 ENCOUNTER — Encounter: Payer: Self-pay | Admitting: Nurse Practitioner

## 2022-04-07 ENCOUNTER — Ambulatory Visit: Payer: 59 | Admitting: Nurse Practitioner

## 2022-04-07 VITALS — BP 139/89 | HR 78 | Temp 97.8°F | Resp 20 | Ht 64.0 in | Wt 239.0 lb

## 2022-04-07 DIAGNOSIS — F900 Attention-deficit hyperactivity disorder, predominantly inattentive type: Secondary | ICD-10-CM

## 2022-04-07 DIAGNOSIS — K219 Gastro-esophageal reflux disease without esophagitis: Secondary | ICD-10-CM

## 2022-04-07 DIAGNOSIS — Z6841 Body Mass Index (BMI) 40.0 and over, adult: Secondary | ICD-10-CM

## 2022-04-07 DIAGNOSIS — K5730 Diverticulosis of large intestine without perforation or abscess without bleeding: Secondary | ICD-10-CM

## 2022-04-07 DIAGNOSIS — F320 Major depressive disorder, single episode, mild: Secondary | ICD-10-CM

## 2022-04-07 DIAGNOSIS — F411 Generalized anxiety disorder: Secondary | ICD-10-CM | POA: Diagnosis not present

## 2022-04-07 DIAGNOSIS — I10 Essential (primary) hypertension: Secondary | ICD-10-CM

## 2022-04-07 MED ORDER — MONTELUKAST SODIUM 10 MG PO TABS: 10.0000 mg | ORAL_TABLET | Freq: Every day | ORAL | 1 refills | Status: DC

## 2022-04-07 MED ORDER — OLMESARTAN MEDOXOMIL 40 MG PO TABS: 40.0000 mg | ORAL_TABLET | Freq: Every morning | ORAL | 1 refills | Status: DC

## 2022-04-07 MED ORDER — ALPRAZOLAM 1 MG PO TABS: 1.0000 mg | ORAL_TABLET | Freq: Two times a day (BID) | ORAL | 5 refills | Status: DC

## 2022-04-07 MED ORDER — FUROSEMIDE 20 MG PO TABS: 20.0000 mg | ORAL_TABLET | Freq: Every day | ORAL | 1 refills | Status: DC

## 2022-04-07 MED ORDER — OMEPRAZOLE 40 MG PO CPDR: 40.0000 mg | DELAYED_RELEASE_CAPSULE | Freq: Every day | ORAL | 1 refills | Status: DC

## 2022-04-07 MED ORDER — BUPROPION HCL ER (XL) 150 MG PO TB24: 450.0000 mg | ORAL_TABLET | Freq: Every day | ORAL | 1 refills | Status: DC

## 2022-04-07 MED ORDER — AMLODIPINE BESYLATE 5 MG PO TABS: 5.0000 mg | ORAL_TABLET | Freq: Every day | ORAL | 1 refills | Status: DC

## 2022-04-07 NOTE — Addendum Note (Signed)
Addended by: Chevis Pretty on: 04/07/2022 12:24 PM   Modules accepted: Orders

## 2022-04-07 NOTE — Progress Notes (Signed)
Subjective:    Patient ID: Deanna [name], female    DOB: [birth year], 61 y.o.   MRN: [id number]   Chief Complaint: Medical Management of Chronic Issues    HPI:  Deanna [name] is a 61 y.o. who identifies as a female who was assigned female at birth.   Social history: Lives with: husband Work history: Pensions consultant and gamble   Comes in today for follow up of the following chronic medical issues:  1. GAD (generalized anxiety disorder) Is on xanax daily and is doing well.    04/07/2022   11:49 AM 10/04/2021   10:33 AM 03/21/2021    8:11 AM 09/21/2020    8:08 AM  GAD 7 : Generalized Anxiety Score  Nervous, Anxious, on Edge 1 0 1 2  Control/stop worrying 1 1 1  0  Worry too much - different things 1 1 1 1   Trouble relaxing 0 0 0 0  Restless 0 0 0 0  Easily annoyed or irritable 0 1 1 2   Afraid - awful might happen 0 0 0 0  Total GAD 7 Score 3 3 4 5   Anxiety Difficulty Not difficult at all Not difficult at all Not difficult at all Not difficult at all      2. Essential hypertension, benign No c/o chest pain, sob or headache. Doe snot check blood pressure at home. BP Readings from Last 3 Encounters:  04/07/22 139/89  10/04/21 139/82  03/21/21 132/80     3. Gastroesophageal reflux disease without esophagitis Is on omeprazole daily and is doing well.  4. Attention deficit hyperactivity disorder (ADHD), predominantly inattentive type Is currently on no meds  5. Depression, major, single episode, mild ([place]) Is on wellbutrin daily and is doing well    04/07/2022   11:49 AM 10/04/2021   10:31 AM 03/21/2021    8:10 AM  Depression screen PHQ 2/9  Decreased Interest 1 0 1  Down, Depressed, Hopeless 1 1 0  PHQ - 2 Score 2 1 1   Altered sleeping 0 0 2  Tired, decreased energy 1 3 1   Change in appetite 1 3 2   Feeling bad or failure about yourself  0 1 1  Trouble concentrating 1 0 2  Moving slowly or fidgety/restless 0 0 0  Suicidal thoughts 0 0 0  PHQ-9 Score 5 8 9    Difficult doing work/chores Not difficult at all Not difficult at all Not difficult at all     6. Diverticulosis of sigmoid colon No recent flare ups  7. BMI 40.0-44.9, adult (HCC) Weight is down 11lbs Wt Readings from Last 3 Encounters:  04/07/22 239 lb (108.4 kg)  10/04/21 250 lb (113.4 kg)  04/24/21 240 lb (108.9 kg)   BMI Readings from Last 3 Encounters:  04/07/22 41.02 kg/m  10/04/21 42.91 kg/m  04/24/21 41.20 kg/m      New complaints: None today  Allergies  Allergen Reactions   Iodinated Contrast Media Rash    CARDIAC ARREST    Iodine Hives, Other (See Comments) and Swelling   Iohexol Anaphylaxis    IBD dye   Clindamycin/Lincomycin Nausea And Vomiting   Outpatient Encounter Medications as of 04/07/2022  Medication Sig   ALPRAZolam (XANAX) 1 MG tablet TAKE ONE TABLET TWICE A DAY.   amLODipine (NORVASC) 5 MG tablet Take 1 tablet (5 mg total) by mouth daily.   buPROPion (WELLBUTRIN XL) 150 MG 24 hr tablet Take 3 tablets (450 mg total) by mouth daily.   CALCIUM-VITAMIN D PO  Take 1 tablet by mouth. 600 mg of calcium   fluticasone (FLONASE) 50 MCG/ACT nasal spray Place 2 sprays into both nostrils daily.   fluticasone-salmeterol (ADVAIR DISKUS) 250-50 MCG/ACT AEPB Inhale 1 puff into the lungs in the morning and at bedtime.   furosemide (LASIX) 20 MG tablet Take 1 tablet (20 mg total) by mouth daily. (Needs to be seen before next refill)   montelukast (SINGULAIR) 10 MG tablet Take 1 tablet (10 mg total) by mouth at bedtime.   Multiple Vitamin (MULTIVITAMIN) tablet Take 1 tablet by mouth daily.   olmesartan (BENICAR) 40 MG tablet Take 1 tablet (40 mg total) by mouth every morning. (Needs to be seen before next refill)   omeprazole (PRILOSEC) 40 MG capsule Take 1 capsule (40 mg total) by mouth daily.   SUMAtriptan (IMITREX) 100 MG tablet TAKE 1 TABLET ONCE DAILY AS NEEDED FOR MIGRAINE AS DIRECTED BY PHYSICIAN.   valACYclovir (VALTREX) 1000 MG tablet TAKE  (1)  TABLET   EVERY TWELVE HOURS.   VENTOLIN HFA 108 (90 Base) MCG/ACT inhaler Inhale 2 puffs into the lungs every 6 (six) hours as needed for wheezing or shortness of breath.   Semaglutide,0.25 or 0.5MG/DOS, (OZEMPIC, 0.25 OR 0.5 MG/DOSE,) 2 MG/1.5ML SOPN Inject 0.5 mg into the skin once a week. (Patient not taking: Reported on 04/07/2022)   Semaglutide-Weight Management (WEGOVY) 0.25 MG/0.5ML SOAJ Inject 0.25 mg into the skin once a week.   [DISCONTINUED] ALPRAZolam (XANAX) 1 MG tablet Take 1 tablet (1 mg total) by mouth 2 (two) times daily.   No facility-administered encounter medications on file as of 04/07/2022.    Past Surgical History:  Procedure Laterality Date   ABDOMINAL HYSTERECTOMY  02/2010   partial   BREAST REDUCTION SURGERY  02/2011   BREAST SURGERY     cancer   CHOLECYSTECTOMY  07/17/11   w/IOC   COLONOSCOPY     NASAL SINUS SURGERY  02/2011   TONSILLECTOMY  age 34    Family History  Problem Relation Age of Onset   Mental illness Mother    Stroke Mother        died with this at age 97   Heart disease Mother    Hyperlipidemia Mother    Alcohol abuse Father    Cancer Paternal Grandmother        colon   Colon cancer Paternal Grandmother    Alcohol abuse Brother    Asthma Brother    Hypertension Brother    Esophageal cancer Neg Hx    Rectal cancer Neg Hx    Stomach cancer Neg Hx       Controlled substance contract: n/a     Review of Systems  Constitutional:  Negative for diaphoresis.  Eyes:  Negative for pain.  Respiratory:  Negative for shortness of breath.   Cardiovascular:  Negative for chest pain, palpitations and leg swelling.  Gastrointestinal:  Negative for abdominal pain.  Endocrine: Negative for polydipsia.  Skin:  Negative for rash.  Neurological:  Negative for dizziness, weakness and headaches.  Hematological:  Does not bruise/bleed easily.  All other systems reviewed and are negative.      Objective:   Physical Exam Vitals and nursing note  reviewed.  Constitutional:      General: She is not in acute distress.    Appearance: Normal appearance. She is well-developed.  HENT:     Head: Normocephalic.     Right Ear: Tympanic membrane normal.     Left Ear: Tympanic membrane normal.  Nose: Nose normal.     Mouth/Throat:     Mouth: Mucous membranes are moist.  Eyes:     Pupils: Pupils are equal, round, and reactive to light.  Neck:     Vascular: No carotid bruit or JVD.  Cardiovascular:     Rate and Rhythm: Normal rate and regular rhythm.     Heart sounds: Normal heart sounds.  Pulmonary:     Effort: Pulmonary effort is normal. No respiratory distress.     Breath sounds: Normal breath sounds. No wheezing or rales.  Chest:     Chest wall: No tenderness.  Abdominal:     General: Bowel sounds are normal. There is no distension or abdominal bruit.     Palpations: Abdomen is soft. There is no hepatomegaly, splenomegaly, mass or pulsatile mass.     Tenderness: There is no abdominal tenderness.  Musculoskeletal:        General: Normal range of motion.     Cervical back: Normal range of motion and neck supple.  Lymphadenopathy:     Cervical: No cervical adenopathy.  Skin:    General: Skin is warm and dry.  Neurological:     Mental Status: She is alert and oriented to person, place, and time.     Deep Tendon Reflexes: Reflexes are normal and symmetric.  Psychiatric:        Behavior: Behavior normal.        Thought Content: Thought content normal.        Judgment: Judgment normal.    BP 139/89   Pulse 78   Temp 97.8 F (36.6 C) (Temporal)   Resp 20   Ht 5' 4"  (1.626 m)   Wt 239 lb (108.4 kg)   SpO2 96%   BMI 41.02 kg/m         Assessment & Plan:   CHERIDAN KIBLER comes in today with chief complaint of Medical Management of Chronic Issues   Diagnosis and orders addressed:  1. GAD (generalized anxiety disorder) Stress manaegment - ToxASSURE Select 13 (MW), Urine - ALPRAZolam (XANAX) 1 MG tablet; Take  1 tablet (1 mg total) by mouth 2 (two) times daily.  Dispense: 60 tablet; Refill: 5  2. Essential hypertension, benign Low sodium diet - amLODipine (NORVASC) 5 MG tablet; Take 1 tablet (5 mg total) by mouth daily.  Dispense: 90 tablet; Refill: 1 - olmesartan (BENICAR) 40 MG tablet; Take 1 tablet (40 mg total) by mouth every morning. (Needs to be seen before next refill)  Dispense: 90 tablet; Refill: 1 - furosemide (LASIX) 20 MG tablet; Take 1 tablet (20 mg total) by mouth daily. (Needs to be seen before next refill)  Dispense: 90 tablet; Refill: 1 - CBC with Differential/Platelet - CMP14+EGFR - Lipid panel  3. Gastroesophageal reflux disease without esophagitis Avoid spicy foods Do not eat 2 hours prior to bedtime - omeprazole (PRILOSEC) 40 MG capsule; Take 1 capsule (40 mg total) by mouth daily.  Dispense: 180 capsule; Refill: 1  4. Attention deficit hyperactivity disorder (ADHD), predominantly inattentive type   5. Depression, major, single episode, mild (HCC) Stress management - buPROPion (WELLBUTRIN XL) 150 MG 24 hr tablet; Take 3 tablets (450 mg total) by mouth daily.  Dispense: 270 tablet; Refill: 1  6. Diverticulosis of sigmoid colon Watch diet to prevent flare up  7. BMI 40.0-44.9, adult ([place]) Discussed diet and exercise for person with BMI >25 Will recheck weight in 3-6 months    Labs pending Health Maintenance reviewed Diet and  exercise encouraged  Follow up plan: 6 months   Mary-Margaret Hassell Done, FNP

## 2022-04-09 LAB — TOXASSURE SELECT 13 (MW), URINE

## 2022-05-30 ENCOUNTER — Encounter: Payer: Self-pay | Admitting: Nurse Practitioner

## 2022-10-06 ENCOUNTER — Encounter: Payer: 59 | Admitting: Nurse Practitioner

## 2022-10-06 ENCOUNTER — Encounter: Payer: Self-pay | Admitting: Nurse Practitioner

## 2022-10-08 ENCOUNTER — Other Ambulatory Visit: Payer: Self-pay | Admitting: Nurse Practitioner

## 2022-10-08 DIAGNOSIS — I10 Essential (primary) hypertension: Secondary | ICD-10-CM

## 2022-10-08 DIAGNOSIS — F411 Generalized anxiety disorder: Secondary | ICD-10-CM

## 2022-10-08 DIAGNOSIS — F320 Major depressive disorder, single episode, mild: Secondary | ICD-10-CM

## 2022-10-08 DIAGNOSIS — K219 Gastro-esophageal reflux disease without esophagitis: Secondary | ICD-10-CM

## 2022-10-21 ENCOUNTER — Other Ambulatory Visit: Payer: Self-pay | Admitting: Nurse Practitioner

## 2022-10-21 DIAGNOSIS — F411 Generalized anxiety disorder: Secondary | ICD-10-CM

## 2022-10-27 ENCOUNTER — Encounter: Payer: Self-pay | Admitting: Nurse Practitioner

## 2022-10-27 ENCOUNTER — Other Ambulatory Visit (HOSPITAL_COMMUNITY)
Admission: RE | Admit: 2022-10-27 | Discharge: 2022-10-27 | Disposition: A | Discharge status: Home / Self Care | Payer: 59 | Source: Ambulatory Visit | Attending: Nurse Practitioner | Admitting: Nurse Practitioner

## 2022-10-27 ENCOUNTER — Ambulatory Visit (INDEPENDENT_AMBULATORY_CARE_PROVIDER_SITE_OTHER): Payer: 59 | Admitting: Nurse Practitioner

## 2022-10-27 VITALS — BP 150/91 | HR 84 | Temp 97.4°F | Resp 20 | Ht 64.0 in | Wt 251.0 lb

## 2022-10-27 DIAGNOSIS — F411 Generalized anxiety disorder: Secondary | ICD-10-CM

## 2022-10-27 DIAGNOSIS — K219 Gastro-esophageal reflux disease without esophagitis: Secondary | ICD-10-CM

## 2022-10-27 DIAGNOSIS — K5730 Diverticulosis of large intestine without perforation or abscess without bleeding: Secondary | ICD-10-CM

## 2022-10-27 DIAGNOSIS — Z0000 Encounter for general adult medical examination without abnormal findings: Secondary | ICD-10-CM | POA: Diagnosis present

## 2022-10-27 DIAGNOSIS — Z6841 Body Mass Index (BMI) 40.0 and over, adult: Secondary | ICD-10-CM

## 2022-10-27 DIAGNOSIS — F320 Major depressive disorder, single episode, mild: Secondary | ICD-10-CM

## 2022-10-27 DIAGNOSIS — Z0001 Encounter for general adult medical examination with abnormal findings: Secondary | ICD-10-CM

## 2022-10-27 DIAGNOSIS — G43809 Other migraine, not intractable, without status migrainosus: Secondary | ICD-10-CM

## 2022-10-27 DIAGNOSIS — I10 Essential (primary) hypertension: Secondary | ICD-10-CM | POA: Diagnosis not present

## 2022-10-27 DIAGNOSIS — F900 Attention-deficit hyperactivity disorder, predominantly inattentive type: Secondary | ICD-10-CM

## 2022-10-27 LAB — URINALYSIS, COMPLETE
Bilirubin, UA: NEGATIVE
Glucose, UA: NEGATIVE
Ketones, UA: NEGATIVE
Leukocytes,UA: NEGATIVE
Nitrite, UA: NEGATIVE
Protein,UA: NEGATIVE
RBC, UA: NEGATIVE
Specific Gravity, UA: 1.02 (ref 1.005–1.030)
Urobilinogen, Ur: 0.2 mg/dL (ref 0.2–1.0)
pH, UA: 6 (ref 5.0–7.5)

## 2022-10-27 LAB — MICROSCOPIC EXAMINATION
RBC, Urine: NONE SEEN /hpf (ref 0–2)
Renal Epithel, UA: NONE SEEN /hpf

## 2022-10-27 LAB — LIPID PANEL

## 2022-10-27 MED ORDER — OMEPRAZOLE 40 MG PO CPDR: 40.0000 mg | DELAYED_RELEASE_CAPSULE | Freq: Every day | ORAL | 1 refills | Status: DC

## 2022-10-27 MED ORDER — AMLODIPINE BESYLATE 5 MG PO TABS
5.00 mg | ORAL_TABLET | Freq: Every day | ORAL | 1 refills | Status: DC
Start: 2022-10-27 — End: 2023-01-30

## 2022-10-27 MED ORDER — FUROSEMIDE 20 MG PO TABS: 20.0000 mg | ORAL_TABLET | Freq: Every day | ORAL | 1 refills | Status: DC

## 2022-10-27 MED ORDER — BUPROPION HCL ER (XL) 150 MG PO TB24
450.0000 mg | ORAL_TABLET | Freq: Every day | ORAL | 1 refills | Status: DC
Start: 2022-10-27 — End: 2023-01-14

## 2022-10-27 MED ORDER — OLMESARTAN MEDOXOMIL 40 MG PO TABS
40.00 mg | ORAL_TABLET | Freq: Every morning | ORAL | 1 refills | Status: DC
Start: 2022-10-27 — End: 2023-01-30

## 2022-10-27 MED ORDER — ALPRAZOLAM 1 MG PO TABS
1.0000 mg | ORAL_TABLET | Freq: Two times a day (BID) | ORAL | 5 refills | Status: DC
Start: 2022-10-27 — End: 2023-01-30

## 2022-10-27 MED ORDER — SUMATRIPTAN SUCCINATE 100 MG PO TABS
ORAL_TABLET | ORAL | 11 refills | Status: DC
Start: 2022-10-27 — End: 2023-01-30

## 2022-10-27 NOTE — Addendum Note (Signed)
Addended by: Rolena Infante on: 10/27/2022 12:50 PM   Modules accepted: Orders

## 2022-10-27 NOTE — Patient Instructions (Signed)

## 2022-10-27 NOTE — Progress Notes (Signed)
Subjective:    Patient ID: Deanna [name], female    DOB: [birth year], 61 y.o.   MRN: [id number]   Chief Complaint: annual physical   HPI:  Deanna [name] is a 62 y.o. who identifies as a female who was assigned female at birth.   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 Annual physical exam Pap today  2. ESSENTIAL HYPERTENSION, BENIGN No c/o chest pain, sob or headache. Doe snot check blood pressure at home. BP Readings from Last 3 Encounters:  04/07/22 139/89  10/04/21 139/82  03/21/21 132/80     3. Gastroesophageal reflux disease without esophagitis Is on omperazole daily and is doing well.  4. Other migraine without status migrainosus, not intractable Takes imitrex when she has a migraine. Woks well.  5. Diverticulosis of sigmoid colon Denies any recent flare ups  6. Depression, major, single episode, mild Is on wellbutrin and that is working well for her.    10/27/2022    9:56 AM 04/07/2022   11:49 AM 10/04/2021   10:31 AM  Depression screen PHQ 2/9  Decreased Interest 2 1 0  Down, Depressed, Hopeless 1 1 1   PHQ - 2 Score 3 2 1   Altered sleeping 1 0 0  Tired, decreased energy 1 1 3   Change in appetite 1 1 3   Feeling bad or failure about yourself  1 0 1  Trouble concentrating 1 1 0  Moving slowly or fidgety/restless 0 0 0  Suicidal thoughts 0 0 0  PHQ-9 Score 8 5 8   Difficult doing work/chores Somewhat difficult Not difficult at all Not difficult at all     7. Attention deficit hyperactivity disorder (ADHD), predominantly inattentive type Is on no meds for this. Is able  to compensate and get her work done.  8. GAD (generalized anxiety disorder) Is on xnaax BID    10/27/2022    9:57 AM 04/07/2022   11:49 AM 10/04/2021   10:33 AM 03/21/2021    8:11 AM  GAD 7 : Generalized Anxiety Score  Nervous, Anxious, on Edge 1 1 0 1  Control/stop worrying  1 1 1 1   Worry too much - different things 1 1 1 1   Trouble relaxing 0 0 0 0  Restless 0 0 0 0  Easily annoyed or irritable 0 0 1 1  Afraid - awful might happen 0 0 0 0  Total GAD 7 Score 3 3 3 4   Anxiety Difficulty Not difficult at all Not difficult at all Not difficult at all Not difficult at all      9. BMI 40.0-44.9, adult No weight changes Wt Readings from Last 3 Encounters:  10/27/22 251 lb (113.9 kg)  04/07/22 239 lb (108.4 kg)  10/04/21 250 lb (113.4 kg)   BMI Readings from Last 3 Encounters:  10/27/22 43.08 kg/m  04/07/22 41.02 kg/m  10/04/21 42.91 kg/m     New complaints: None today  Allergies  Allergen Reactions   Iodinated Contrast Media Rash    CARDIAC ARREST    Iodine Hives, Other (See Comments) and Swelling   Iohexol Anaphylaxis    IBD dye   Clindamycin/Lincomycin Nausea And Vomiting   Outpatient Encounter Medications as of 10/27/2022  Medication Sig   ALPRAZolam (XANAX) 1 MG tablet Take 1 tablet (1 mg total) by mouth 2 (two) times daily.   amLODipine (NORVASC) 5 MG tablet Take  1 tablet (5 mg total) by mouth daily. (NEEDS TO BE SEEN BEFORE NEXT REFILL)   buPROPion (WELLBUTRIN XL) 150 MG 24 hr tablet Take 3 tablets (450 mg total) by mouth daily. (NEEDS TO BE SEEN BEFORE NEXT REFILL)   CALCIUM-VITAMIN D PO Take 1 tablet by mouth. 600 mg of calcium   fluticasone (FLONASE) 50 MCG/ACT nasal spray Place 2 sprays into both nostrils daily.   fluticasone-salmeterol (ADVAIR DISKUS) 250-50 MCG/ACT AEPB Inhale 1 puff into the lungs in the morning and at bedtime.   furosemide (LASIX) 20 MG tablet Take 1 tablet (20 mg total) by mouth daily. (NEEDS TO BE SEEN BEFORE NEXT REFILL)   montelukast (SINGULAIR) 10 MG tablet Take 1 tablet (10 mg total) by mouth at bedtime. (NEEDS TO BE SEEN BEFORE NEXT REFILL)   Multiple Vitamin (MULTIVITAMIN) tablet Take 1 tablet by mouth daily.   olmesartan (BENICAR) 40 MG tablet Take 1 tablet (40 mg total) by mouth every morning. (NEEDS  TO BE SEEN BEFORE NEXT REFILL)   omeprazole (PRILOSEC) 40 MG capsule Take 1 capsule (40 mg total) by mouth daily. (NEEDS TO BE SEEN BEFORE NEXT REFILL)   Semaglutide-Weight Management (WEGOVY) 0.25 MG/0.5ML SOAJ Inject 0.25 mg into the skin once a week.   SUMAtriptan (IMITREX) 100 MG tablet TAKE 1 TABLET ONCE DAILY AS NEEDED FOR MIGRAINE AS DIRECTED BY PHYSICIAN.   valACYclovir (VALTREX) 1000 MG tablet TAKE  (1)  TABLET  EVERY TWELVE HOURS.   VENTOLIN HFA 108 (90 Base) MCG/ACT inhaler Inhale 2 puffs into the lungs every 6 (six) hours as needed for wheezing or shortness of breath.   No facility-administered encounter medications on file as of 10/27/2022.    Past Surgical History:  Procedure Laterality Date   ABDOMINAL HYSTERECTOMY  02/2010   partial   BREAST REDUCTION SURGERY  02/2011   BREAST SURGERY     cancer   CHOLECYSTECTOMY  07/17/11   w/IOC   COLONOSCOPY     NASAL SINUS SURGERY  02/2011   TONSILLECTOMY  age 44    Family History  Problem Relation Age of Onset   Mental illness Mother    Stroke Mother        died with this at age 73   Heart disease Mother    Hyperlipidemia Mother    Alcohol abuse Father    Cancer Paternal Grandmother        colon   Colon cancer Paternal Grandmother    Alcohol abuse Brother    Asthma Brother    Hypertension Brother    Esophageal cancer Neg Hx    Rectal cancer Neg Hx    Stomach cancer Neg Hx       Controlled substance contract: 04/17/22  drug screen done 04/07/22     Review of Systems  Constitutional:  Negative for diaphoresis.  Eyes:  Negative for pain.  Respiratory:  Negative for shortness of breath.   Cardiovascular:  Negative for chest pain, palpitations and leg swelling.  Gastrointestinal:  Negative for abdominal pain.  Endocrine: Negative for polydipsia.  Skin:  Negative for rash.  Neurological:  Negative for dizziness, weakness and headaches.  Hematological:  Does not bruise/bleed easily.  All other systems reviewed and  are negative.      Objective:   Physical Exam Vitals and nursing note reviewed.  Constitutional:      General: She is not in acute distress.    Appearance: Normal appearance. She is well-developed.  HENT:     Head: Normocephalic.  Right Ear: Tympanic membrane normal.     Left Ear: Tympanic membrane normal.     Nose: Nose normal.     Mouth/Throat:     Mouth: Mucous membranes are moist.  Eyes:     Pupils: Pupils are equal, round, and reactive to light.  Neck:     Vascular: No carotid bruit or JVD.  Cardiovascular:     Rate and Rhythm: Normal rate and regular rhythm.     Heart sounds: Normal heart sounds.  Pulmonary:     Effort: Pulmonary effort is normal. No respiratory distress.     Breath sounds: Normal breath sounds. No wheezing or rales.  Chest:     Chest wall: No tenderness.  Abdominal:     General: Bowel sounds are normal. There is no distension or abdominal bruit.     Palpations: Abdomen is soft. There is no hepatomegaly, splenomegaly, mass or pulsatile mass.     Tenderness: There is no abdominal tenderness.  Genitourinary:    General: Normal vulva.     Vagina: No vaginal discharge.     Rectum: Normal.     Comments: No adnexal masses or tenderness Cervix parous and pink  Musculoskeletal:        General: Normal range of motion.     Cervical back: Normal range of motion and neck supple.  Lymphadenopathy:     Cervical: No cervical adenopathy.  Skin:    General: Skin is warm and dry.  Neurological:     Mental Status: She is alert and oriented to person, place, and time.     Deep Tendon Reflexes: Reflexes are normal and symmetric.  Psychiatric:        Behavior: Behavior normal.        Thought Content: Thought content normal.        Judgment: Judgment normal.   BP (!) 150/91   Pulse 84   Temp (!) 97.4 F (36.3 C) (Temporal)   Resp 20   Ht 5\' 4"  (1.626 m)   Wt 251 lb (113.9 kg)   SpO2 95%   BMI 43.08 kg/m         Assessment & Plan:  Deanna [name] comes in today with chief complaint of No chief complaint on file.   Diagnosis and orders addressed:  1. Annual physical exam  - CBC with Differential/Platelet - Thyroid Panel With TSH - Cytology - PAP  2. ESSENTIAL HYPERTENSION, BENIGN Low sodium diet - CMP14+EGFR - Lipid panel - amLODipine (NORVASC) 5 MG tablet; Take 1 tablet (5 mg total) by mouth daily. (NEEDS TO BE SEEN BEFORE NEXT REFILL)  Dispense: 90 tablet; Refill: 1 - furosemide (LASIX) 20 MG tablet; Take 1 tablet (20 mg total) by mouth daily. (NEEDS TO BE SEEN BEFORE NEXT REFILL)  Dispense: 90 tablet; Refill: 1 - olmesartan (BENICAR) 40 MG tablet; Take 1 tablet (40 mg total) by mouth every morning. (NEEDS TO BE SEEN BEFORE NEXT REFILL)  Dispense: 90 tablet; Refill: 1  3. Gastroesophageal reflux disease without esophagitis Avoid spicy foods Do not eat 2 hours prior to bedtime - omeprazole (PRILOSEC) 40 MG capsule; Take 1 capsule (40 mg total) by mouth daily. (NEEDS TO BE SEEN BEFORE NEXT REFILL)  Dispense: 90 capsule; Refill: 1  4. Other migraine without status migrainosus, not intractable - SUMAtriptan (IMITREX) 100 MG tablet; TAKE 1 TABLET ONCE DAILY AS NEEDED FOR MIGRAINE AS DIRECTED BY PHYSICIAN.  Dispense: 9 tablet; Refill: 11  5. Diverticulosis of sigmoid colon Watch diet to  prevent flare  ups  6. Depression, major, single episode, mild Stress ,management - buPROPion (WELLBUTRIN XL) 150 MG 24 hr tablet; Take 3 tablets (450 mg total) by mouth daily. (NEEDS TO BE SEEN BEFORE NEXT REFILL)  Dispense: 90 tablet; Refill: 1  7. Attention deficit hyperactivity disorder (ADHD), predominantly inattentive type  8. GAD (generalized anxiety disorder) - ALPRAZolam (XANAX) 1 MG tablet; Take 1 tablet (1 mg total) by mouth 2 (two) times daily.  Dispense: 60 tablet; Refill: 5  9. BMI 40.0-44.9, adult Discussed diet and exercise for person with BMI >25 Will recheck weight in 3-6 months    Labs pending Health  Maintenance reviewed Diet and exercise encouraged  Follow up plan: 6 months   Mary-Margaret Hassell Done, FNP

## 2022-10-28 LAB — THYROID PANEL WITH TSH
Free Thyroxine Index: 1.9 (ref 1.2–4.9)
T3 Uptake Ratio: 24 % (ref 24–39)
T4, Total: 7.8 ug/dL (ref 4.5–12.0)
TSH: 4.21 u[IU]/mL (ref 0.450–4.500)

## 2022-10-28 LAB — CMP14+EGFR
ALT: 29 IU/L (ref 0–32)
AST: 22 IU/L (ref 0–40)
Albumin/Globulin Ratio: 2 (ref 1.2–2.2)
Albumin: 4.4 g/dL (ref 3.9–4.9)
Alkaline Phosphatase: 94 IU/L (ref 44–121)
BUN/Creatinine Ratio: 22 (ref 12–28)
BUN: 20 mg/dL (ref 8–27)
Bilirubin Total: 0.3 mg/dL (ref 0.0–1.2)
CO2: 20 mmol/L (ref 20–29)
Calcium: 9.1 mg/dL (ref 8.7–10.3)
Chloride: 106 mmol/L (ref 96–106)
Creatinine, Ser: 0.93 mg/dL (ref 0.57–1.00)
Globulin, Total: 2.2 g/dL (ref 1.5–4.5)
Glucose: 115 mg/dL — ABNORMAL HIGH (ref 70–99)
Potassium: 4.4 mmol/L (ref 3.5–5.2)
Sodium: 141 mmol/L (ref 134–144)
Total Protein: 6.6 g/dL (ref 6.0–8.5)
eGFR: 70 mL/min/{1.73_m2} (ref >59)

## 2022-10-28 LAB — CBC WITH DIFFERENTIAL/PLATELET
Basophils Absolute: 0.1 10*3/uL (ref 0.0–0.2)
Basos: 1 %
EOS (ABSOLUTE): 0.1 10*3/uL (ref 0.0–0.4)
Eos: 1 %
Hematocrit: 41.7 % (ref 34.0–46.6)
Hemoglobin: 14.3 g/dL (ref 11.1–15.9)
Immature Grans (Abs): 0 10*3/uL (ref 0.0–0.1)
Immature Granulocytes: 0 %
Lymphocytes Absolute: 2.4 10*3/uL (ref 0.7–3.1)
Lymphs: 37 %
MCH: 29.6 pg (ref 26.6–33.0)
MCHC: 34.3 g/dL (ref 31.5–35.7)
MCV: 86 fL (ref 79–97)
Monocytes Absolute: 0.7 10*3/uL (ref 0.1–0.9)
Monocytes: 10 %
Neutrophils Absolute: 3.3 10*3/uL (ref 1.4–7.0)
Neutrophils: 51 %
Platelets: 370 10*3/uL (ref 150–450)
RBC: 4.83 x10E6/uL (ref 3.77–5.28)
RDW: 12.7 % (ref 11.7–15.4)
WBC: 6.6 10*3/uL (ref 3.4–10.8)

## 2022-10-28 LAB — CYTOLOGY - PAP: Diagnosis: NEGATIVE

## 2022-10-28 LAB — LIPID PANEL
Chol/HDL Ratio: 4 ratio (ref 0.0–4.4)
Cholesterol, Total: 174 mg/dL (ref 100–199)
HDL: 44 mg/dL (ref >39)
LDL Chol Calc (NIH): 103 mg/dL — ABNORMAL HIGH (ref 0–99)
Triglycerides: 155 mg/dL — ABNORMAL HIGH (ref 0–149)
VLDL Cholesterol Cal: 27 mg/dL (ref 5–40)

## 2022-11-24 ENCOUNTER — Other Ambulatory Visit: Payer: Self-pay | Admitting: Nurse Practitioner

## 2023-01-14 ENCOUNTER — Other Ambulatory Visit: Payer: Self-pay | Admitting: Nurse Practitioner

## 2023-01-14 DIAGNOSIS — F320 Major depressive disorder, single episode, mild: Secondary | ICD-10-CM

## 2023-01-30 ENCOUNTER — Encounter: Payer: Self-pay | Admitting: Nurse Practitioner

## 2023-01-30 ENCOUNTER — Ambulatory Visit: Payer: 59 | Admitting: Nurse Practitioner

## 2023-01-30 VITALS — BP 128/83 | HR 84 | Temp 98.1°F | Resp 20 | Ht 64.0 in | Wt 254.0 lb

## 2023-01-30 DIAGNOSIS — G43809 Other migraine, not intractable, without status migrainosus: Secondary | ICD-10-CM | POA: Diagnosis not present

## 2023-01-30 DIAGNOSIS — F411 Generalized anxiety disorder: Secondary | ICD-10-CM

## 2023-01-30 DIAGNOSIS — I10 Essential (primary) hypertension: Secondary | ICD-10-CM

## 2023-01-30 DIAGNOSIS — K219 Gastro-esophageal reflux disease without esophagitis: Secondary | ICD-10-CM | POA: Diagnosis not present

## 2023-01-30 DIAGNOSIS — J4530 Mild persistent asthma, uncomplicated: Secondary | ICD-10-CM

## 2023-01-30 DIAGNOSIS — Z6841 Body Mass Index (BMI) 40.0 and over, adult: Secondary | ICD-10-CM

## 2023-01-30 DIAGNOSIS — F320 Major depressive disorder, single episode, mild: Secondary | ICD-10-CM

## 2023-01-30 DIAGNOSIS — K5730 Diverticulosis of large intestine without perforation or abscess without bleeding: Secondary | ICD-10-CM

## 2023-01-30 DIAGNOSIS — F900 Attention-deficit hyperactivity disorder, predominantly inattentive type: Secondary | ICD-10-CM

## 2023-01-30 MED ORDER — FUROSEMIDE 20 MG PO TABS: 20.0000 mg | ORAL_TABLET | Freq: Every day | ORAL | 1 refills | Status: DC

## 2023-01-30 MED ORDER — ALPRAZOLAM 1 MG PO TABS
1.0000 mg | ORAL_TABLET | Freq: Two times a day (BID) | ORAL | 5 refills | Status: DC
Start: 2023-01-30 — End: 2023-08-04

## 2023-01-30 MED ORDER — SUMATRIPTAN SUCCINATE 100 MG PO TABS
ORAL_TABLET | ORAL | 2 refills | Status: DC
Start: 2023-01-30 — End: 2024-02-12

## 2023-01-30 MED ORDER — OLMESARTAN MEDOXOMIL 40 MG PO TABS
40.0000 mg | ORAL_TABLET | Freq: Every morning | ORAL | 1 refills | Status: DC
Start: 2023-01-30 — End: 2023-05-14

## 2023-01-30 MED ORDER — OMEPRAZOLE 40 MG PO CPDR: 40.0000 mg | DELAYED_RELEASE_CAPSULE | Freq: Every day | ORAL | 1 refills | Status: DC

## 2023-01-30 MED ORDER — FLUTICASONE-SALMETEROL 250-50 MCG/ACT IN AEPB
1.0000 | INHALATION_SPRAY | Freq: Two times a day (BID) | RESPIRATORY_TRACT | 1 refills | Status: DC
Start: 2023-01-30 — End: 2023-08-18

## 2023-01-30 MED ORDER — AMLODIPINE BESYLATE 5 MG PO TABS: 5.0000 mg | ORAL_TABLET | Freq: Every day | ORAL | 1 refills | Status: DC

## 2023-01-30 MED ORDER — BUPROPION HCL ER (XL) 150 MG PO TB24
450.0000 mg | ORAL_TABLET | Freq: Every day | ORAL | 1 refills | Status: DC
Start: 2023-01-30 — End: 2023-04-13

## 2023-01-30 NOTE — Patient Instructions (Signed)
Calorie Counting for Weight Loss Calories are units of energy. Your body needs a certain number of calories from food to keep going throughout the day. When you eat or drink more calories than your body needs, your body stores the extra calories mostly as fat. When you eat or drink fewer calories than your body needs, your body burns fat to get the energy it needs. Calorie counting means keeping track of how many calories you eat and drink each day. Calorie counting can be helpful if you need to lose weight. If you eat fewer calories than your body needs, you should lose weight. Ask your health care provider what a healthy weight is for you. For calorie counting to work, you will need to eat the right number of calories each day to lose a healthy amount of weight per week. A dietitian can help you figure out how many calories you need in a day and will suggest ways to reach your calorie goal. A healthy amount of weight to lose each week is usually 1-2 lb (0.5-0.9 kg). This usually means that your daily calorie intake should be reduced by 500-750 calories. Eating 1,200-1,500 calories a day can help most women lose weight. Eating 1,500-1,800 calories a day can help most men lose weight. What do I need to know about calorie counting? Work with your health care provider or dietitian to determine how many calories you should get each day. To meet your daily calorie goal, you will need to: Find out how many calories are in each food that you would like to eat. Try to do this before you eat. Decide how much of the food you plan to eat. Keep a food log. Do this by writing down what you ate and how many calories it had. To successfully lose weight, it is important to balance calorie counting with a healthy lifestyle that includes regular activity. Where do I find calorie information?  The number of calories in a food can be found on a Nutrition Facts label. If a food does not have a Nutrition Facts label, try  to look up the calories online or ask your dietitian for help. Remember that calories are listed per serving. If you choose to have more than one serving of a food, you will have to multiply the calories per serving by the number of servings you plan to eat. For example, the label on a package of bread might say that a serving size is 1 slice and that there are 90 calories in a serving. If you eat 1 slice, you will have eaten 90 calories. If you eat 2 slices, you will have eaten 180 calories. How do I keep a food log? After each time that you eat, record the following in your food log as soon as possible: What you ate. Be sure to include toppings, sauces, and other extras on the food. How much you ate. This can be measured in cups, ounces, or number of items. How many calories were in each food and drink. The total number of calories in the food you ate. Keep your food log near you, such as in a pocket-sized notebook or on an app or website on your mobile phone. Some programs will calculate calories for you and show you how many calories you have left to meet your daily goal. What are some portion-control tips? Know how many calories are in a serving. This will help you know how many servings you can have of a certain   food. Use a measuring cup to measure serving sizes. You could also try weighing out portions on a kitchen scale. With time, you will be able to estimate serving sizes for some foods. Take time to put servings of different foods on your favorite plates or in your favorite bowls and cups so you know what a serving looks like. Try not to eat straight from a food's packaging, such as from a bag or box. Eating straight from the package makes it hard to see how much you are eating and can lead to overeating. Put the amount you would like to eat in a cup or on a plate to make sure you are eating the right portion. Use smaller plates, glasses, and bowls for smaller portions and to prevent  overeating. Try not to multitask. For example, avoid watching TV or using your computer while eating. If it is time to eat, sit down at a table and enjoy your food. This will help you recognize when you are full. It will also help you be more mindful of what and how much you are eating. What are tips for following this plan? Reading food labels Check the calorie count compared with the serving size. The serving size may be smaller than what you are used to eating. Check the source of the calories. Try to choose foods that are high in protein, fiber, and vitamins, and low in saturated fat, trans fat, and sodium. Shopping Read nutrition labels while you shop. This will help you make healthy decisions about which foods to buy. Pay attention to nutrition labels for low-fat or fat-free foods. These foods sometimes have the same number of calories or more calories than the full-fat versions. They also often have added sugar, starch, or salt to make up for flavor that was removed with the fat. Make a grocery list of lower-calorie foods and stick to it. Cooking Try to cook your favorite foods in a healthier way. For example, try baking instead of frying. Use low-fat dairy products. Meal planning Use more fruits and vegetables. One-half of your plate should be fruits and vegetables. Include lean proteins, such as chicken, turkey, and fish. Lifestyle Each week, aim to do one of the following: 150 minutes of moderate exercise, such as walking. 75 minutes of vigorous exercise, such as running. General information Know how many calories are in the foods you eat most often. This will help you calculate calorie counts faster. Find a way of tracking calories that works for you. Get creative. Try different apps or programs if writing down calories does not work for you. What foods should I eat?  Eat nutritious foods. It is better to have a nutritious, high-calorie food, such as an avocado, than a food with  few nutrients, such as a bag of potato chips. Use your calories on foods and drinks that will fill you up and will not leave you hungry soon after eating. Examples of foods that fill you up are nuts and nut butters, vegetables, lean proteins, and high-fiber foods such as whole grains. High-fiber foods are foods with more than 5 g of fiber per serving. Pay attention to calories in drinks. Low-calorie drinks include water and unsweetened drinks. The items listed above may not be a complete list of foods and beverages you can eat. Contact a dietitian for more information. What foods should I limit? Limit foods or drinks that are not good sources of vitamins, minerals, or protein or that are high in unhealthy fats. These   include: Candy. Other sweets. Sodas, specialty coffee drinks, alcohol, and juice. The items listed above may not be a complete list of foods and beverages you should avoid. Contact a dietitian for more information. How do I count calories when eating out? Pay attention to portions. Often, portions are much larger when eating out. Try these tips to keep portions smaller: Consider sharing a meal instead of getting your own. If you get your own meal, eat only half of it. Before you start eating, ask for a container and put half of your meal into it. When available, consider ordering smaller portions from the menu instead of full portions. Pay attention to your food and drink choices. Knowing the way food is cooked and what is included with the meal can help you eat fewer calories. If calories are listed on the menu, choose the lower-calorie options. Choose dishes that include vegetables, fruits, whole grains, low-fat dairy products, and lean proteins. Choose items that are boiled, broiled, grilled, or steamed. Avoid items that are buttered, battered, fried, or served with cream sauce. Items labeled as crispy are usually fried, unless stated otherwise. Choose water, low-fat milk,  unsweetened iced tea, or other drinks without added sugar. If you want an alcoholic beverage, choose a lower-calorie option, such as a glass of wine or light beer. Ask for dressings, sauces, and syrups on the side. These are usually high in calories, so you should limit the amount you eat. If you want a salad, choose a garden salad and ask for grilled meats. Avoid extra toppings such as bacon, cheese, or fried items. Ask for the dressing on the side, or ask for olive oil and vinegar or lemon to use as dressing. Estimate how many servings of a food you are given. Knowing serving sizes will help you be aware of how much food you are eating at restaurants. Where to find more information Centers for Disease Control and Prevention: www.cdc.gov [place] Department of Agriculture: myplate.gov Summary Calorie counting means keeping track of how many calories you eat and drink each day. If you eat fewer calories than your body needs, you should lose weight. A healthy amount of weight to lose per week is usually 1-2 lb (0.5-0.9 kg). This usually means reducing your daily calorie intake by 500-750 calories. The number of calories in a food can be found on a Nutrition Facts label. If a food does not have a Nutrition Facts label, try to look up the calories online or ask your dietitian for help. Use smaller plates, glasses, and bowls for smaller portions and to prevent overeating. Use your calories on foods and drinks that will fill you up and not leave you hungry shortly after a meal. This information is not intended to replace advice given to you by your health care provider. Make sure you discuss any questions you have with your health care provider. Document Revised: 08/25/2019 Document Reviewed: 08/25/2019 [employer]vier Patient Education  2023 [employer]  

## 2023-01-30 NOTE — Progress Notes (Signed)
Subjective:    Patient ID: Deanna [name], female    DOB: 05-18-1961, 62 y.o.   MRN: [id number]   Chief Complaint: medical management of chronic issues     HPI:  Deanna [name] is a 62 y.o. who identifies as a female who was assigned female at birth.   Social history: Lives with: husband Work history: [employer]   Comes in today for follow up of the following chronic medical issues:  1. ESSENTIAL HYPERTENSION, BENIGN No c/o chest pain, sob or headache. Doe snot check blood pressure at home. BP Readings from Last 3 Encounters:  10/27/22 (!) 150/91  04/07/22 139/89  10/04/21 139/82     2. Other migraine without status migrainosus, not intractable Has couple of times a month. Imitrex works well to relieve symptoms.  3. Mild persistent asthma without complication Uses her advair daily. Only usees albuterol a couple of times a month.  4. Gastroesophageal reflux disease without esophagitis Omeprazole is working well for her.  5. Diverticulosis of sigmoid colon No recent flare ups  6. Depression, major, single episode, mild (HCC) Is on wellbutrin daily and is doing well.    01/30/2023    8:14 AM 10/27/2022    9:56 AM 04/07/2022   11:49 AM  Depression screen PHQ 2/9  Decreased Interest 2 2 1   Down, Depressed, Hopeless 2 1 1   PHQ - 2 Score 4 3 2   Altered sleeping 2 1 0  Tired, decreased energy 2 1 1   Change in appetite 2 1 1   Feeling bad or failure about yourself  2 1 0  Trouble concentrating 2 1 1   Moving slowly or fidgety/restless 0 0 0  Suicidal thoughts 0 0 0  PHQ-9 Score 14 8 5   Difficult doing work/chores Not difficult at all Somewhat difficult Not difficult at all     7. GAD (generalized anxiety disorder) Xanx BID    01/30/2023    8:14 AM 10/27/2022    9:57 AM 04/07/2022   11:49 AM 10/04/2021   10:33 AM  GAD 7 : Generalized Anxiety Score  Nervous, Anxious, on Edge 2 1 1  0  Control/stop worrying 0 1 1 1   Worry too much - different things 2 1 1 1    Trouble relaxing 0 0 0 0  Restless 0 0 0 0  Easily annoyed or irritable 0 0 0 1  Afraid - awful might happen 0 0 0 0  Total GAD 7 Score 4 3 3 3   Anxiety Difficulty Somewhat difficult Not difficult at all Not difficult at all Not difficult at all      8. Attention deficit hyperactivity disorder (ADHD), predominantly inattentive type Is on no prescription ADHD meds  9. BMI 40.0-44.9, adult (HCC) No recent weight changes Wt Readings from Last 3 Encounters:  01/30/23 254 lb (115.2 kg)  10/27/22 251 lb (113.9 kg)  04/07/22 239 lb (108.4 kg)   BMI Readings from Last 3 Encounters:  01/30/23 43.60 kg/m  10/27/22 43.08 kg/m  04/07/22 41.02 kg/m     New complaints: None today  Allergies  Allergen Reactions   Iodinated Contrast Media Rash    CARDIAC ARREST    Iodine Hives, Other (See Comments) and Swelling   Iohexol Anaphylaxis    IBD dye   Clindamycin/Lincomycin Nausea And Vomiting   Outpatient Encounter Medications as of 01/30/2023  Medication Sig   ALPRAZolam (XANAX) 1 MG tablet Take 1 tablet (1 mg total) by mouth 2 (two) times daily.  amLODipine (NORVASC) 5 MG tablet Take 1 tablet (5 mg total) by mouth daily. (NEEDS TO BE SEEN BEFORE NEXT REFILL)   buPROPion (WELLBUTRIN XL) 150 MG 24 hr tablet TAKE THREE TABLETS BY MOUTH DAILY.   CALCIUM-VITAMIN D PO Take 1 tablet by mouth. 600 mg of calcium   fluticasone (FLONASE) 50 MCG/ACT nasal spray Place 2 sprays into both nostrils daily.   fluticasone-salmeterol (ADVAIR DISKUS) 250-50 MCG/ACT AEPB Inhale 1 puff into the lungs in the morning and at bedtime.   furosemide (LASIX) 20 MG tablet Take 1 tablet (20 mg total) by mouth daily. (NEEDS TO BE SEEN BEFORE NEXT REFILL)   montelukast (SINGULAIR) 10 MG tablet TAKE ONE TABLET BY MOUTH AT BEDTIME.   Multiple Vitamin (MULTIVITAMIN) tablet Take 1 tablet by mouth daily.   olmesartan (BENICAR) 40 MG tablet Take 1 tablet (40 mg total) by mouth every morning. (NEEDS TO BE SEEN BEFORE NEXT  REFILL)   omeprazole (PRILOSEC) 40 MG capsule Take 1 capsule (40 mg total) by mouth daily. (NEEDS TO BE SEEN BEFORE NEXT REFILL)   SUMAtriptan (IMITREX) 100 MG tablet TAKE 1 TABLET ONCE DAILY AS NEEDED FOR MIGRAINE AS DIRECTED BY PHYSICIAN.   valACYclovir (VALTREX) 1000 MG tablet TAKE  (1)  TABLET  EVERY TWELVE HOURS.   VENTOLIN HFA 108 (90 Base) MCG/ACT inhaler Inhale 2 puffs into the lungs every 6 (six) hours as needed for wheezing or shortness of breath.   No facility-administered encounter medications on file as of 01/30/2023.    Past Surgical History:  Procedure Laterality Date   ABDOMINAL HYSTERECTOMY  02/2010   partial   BREAST REDUCTION SURGERY  02/2011   BREAST SURGERY     cancer   CHOLECYSTECTOMY  07/17/11   w/IOC   COLONOSCOPY     NASAL SINUS SURGERY  02/2011   TONSILLECTOMY  age 45    Family History  Problem Relation Age of Onset   Mental illness Mother    Stroke Mother        died with this at age 50   Heart disease Mother    Hyperlipidemia Mother    Alcohol abuse Father    Cancer Paternal Grandmother        colon   Colon cancer Paternal Grandmother    Alcohol abuse Brother    Asthma Brother    Hypertension Brother    Esophageal cancer Neg Hx    Rectal cancer Neg Hx    Stomach cancer Neg Hx       Controlled substance contract: n/a     Review of Systems  Constitutional:  Negative for diaphoresis.  Eyes:  Negative for pain.  Respiratory:  Negative for shortness of breath.   Cardiovascular:  Negative for chest pain, palpitations and leg swelling.  Gastrointestinal:  Negative for abdominal pain.  Endocrine: Negative for polydipsia.  Skin:  Negative for rash.  Neurological:  Negative for dizziness, weakness and headaches.  Hematological:  Does not bruise/bleed easily.  All other systems reviewed and are negative.      Objective:   Physical Exam Vitals and nursing note reviewed.  Constitutional:      General: She is not in acute distress.     Appearance: Normal appearance. She is well-developed.  HENT:     Head: Normocephalic.     Right Ear: Tympanic membrane normal.     Left Ear: Tympanic membrane normal.     Nose: Nose normal.     Mouth/Throat:     Mouth: Mucous membranes  are moist.  Eyes:     Pupils: Pupils are equal, round, and reactive to light.  Neck:     Vascular: No carotid bruit or JVD.  Cardiovascular:     Rate and Rhythm: Normal rate and regular rhythm.     Heart sounds: Normal heart sounds.  Pulmonary:     Effort: Pulmonary effort is normal. No respiratory distress.     Breath sounds: Normal breath sounds. No wheezing or rales.  Chest:     Chest wall: No tenderness.  Abdominal:     General: Bowel sounds are normal. There is no distension or abdominal bruit.     Palpations: Abdomen is soft. There is no hepatomegaly, splenomegaly, mass or pulsatile mass.     Tenderness: There is no abdominal tenderness.  Musculoskeletal:        General: Normal range of motion.     Cervical back: Normal range of motion and neck supple.  Lymphadenopathy:     Cervical: No cervical adenopathy.  Skin:    General: Skin is warm and dry.  Neurological:     Mental Status: She is alert and oriented to person, place, and time.     Deep Tendon Reflexes: Reflexes are normal and symmetric.  Psychiatric:        Behavior: Behavior normal.        Thought Content: Thought content normal.        Judgment: Judgment normal.    BP 128/83   Pulse 84   Temp 98.1 F (36.7 C) (Temporal)   Resp 20   Ht 5\' 4"  (1.626 m)   Wt 254 lb (115.2 kg)   SpO2 95%   BMI 43.60 kg/m         Assessment & Plan:  Deanna [name] comes in today with chief complaint of Wants to discuss weight and Pain in right leg and foot   Diagnosis and orders addressed:  1. ESSENTIAL HYPERTENSION, BENIGN Low sodium diet - Bayer DCA Hb A1c Waived - CBC with Differential/Platelet - CMP14+EGFR - Lipid panel - amLODipine (NORVASC) 5 MG tablet; Take 1 tablet  (5 mg total) by mouth daily. (NEEDS TO BE SEEN BEFORE NEXT REFILL)  Dispense: 90 tablet; Refill: 1 - furosemide (LASIX) 20 MG tablet; Take 1 tablet (20 mg total) by mouth daily. (NEEDS TO BE SEEN BEFORE NEXT REFILL)  Dispense: 90 tablet; Refill: 1 - olmesartan (BENICAR) 40 MG tablet; Take 1 tablet (40 mg total) by mouth every morning. (NEEDS TO BE SEEN BEFORE NEXT REFILL)  Dispense: 90 tablet; Refill: 1  2. Other migraine without status migrainosus, not intractable Avoid caffeine - SUMAtriptan (IMITREX) 100 MG tablet; TAKE 1 TABLET ONCE DAILY AS NEEDED FOR MIGRAINE AS DIRECTED BY PHYSICIAN.  Dispense: 30 tablet; Refill: 2  3. Mild persistent asthma without complication - fluticasone-salmeterol (ADVAIR DISKUS) 250-50 MCG/ACT AEPB; Inhale 1 puff into the lungs in the morning and at bedtime.  Dispense: 180 each; Refill: 1  4. Gastroesophageal reflux disease without esophagitis Avoid spicy foods Do not eat 2 hours prior to bedtime - omeprazole (PRILOSEC) 40 MG capsule; Take 1 capsule (40 mg total) by mouth daily. (NEEDS TO BE SEEN BEFORE NEXT REFILL)  Dispense: 90 capsule; Refill: 1  5. Diverticulosis of sigmoid colon Watch diet to prevent flare up  6. Depression, major, single episode, mild (HCC) Stress management - buPROPion (WELLBUTRIN XL) 150 MG 24 hr tablet; Take 3 tablets (450 mg total) by mouth daily.  Dispense: 90 tablet; Refill: 1  7.  GAD (generalized anxiety disorder) - ALPRAZolam (XANAX) 1 MG tablet; Take 1 tablet (1 mg total) by mouth 2 (two) times daily.  Dispense: 60 tablet; Refill: 5  8. Attention deficit hyperactivity disorder (ADHD), predominantly inattentive type  9. BMI 40.0-44.9, adult (HCC) Discussed diet and exercise for person with BMI >25 Will recheck weight in 3-6 months  - Thyroid Panel With TSH   Labs pending Health Maintenance reviewed Diet and exercise encouraged  Follow up plan: 6 months   Mary-Margaret Daphine Deutscher, FNP

## 2023-02-06 ENCOUNTER — Ambulatory Visit (INDEPENDENT_AMBULATORY_CARE_PROVIDER_SITE_OTHER): Payer: 59 | Admitting: Podiatry

## 2023-02-06 DIAGNOSIS — M79671 Pain in right foot: Secondary | ICD-10-CM

## 2023-02-06 DIAGNOSIS — Z91199 Patient's noncompliance with other medical treatment and regimen due to unspecified reason: Secondary | ICD-10-CM

## 2023-02-06 NOTE — Progress Notes (Signed)
No show

## 2023-02-28 ENCOUNTER — Emergency Department (HOSPITAL_COMMUNITY): Payer: 59

## 2023-02-28 ENCOUNTER — Emergency Department (HOSPITAL_COMMUNITY)
Admission: EM | Admit: 2023-02-28 | Discharge: 2023-02-28 | Disposition: A | Discharge status: Home / Self Care | Payer: 59 | Source: Home / Self Care | Attending: Emergency Medicine | Admitting: Emergency Medicine

## 2023-02-28 ENCOUNTER — Other Ambulatory Visit: Payer: Self-pay

## 2023-02-28 ENCOUNTER — Encounter (HOSPITAL_COMMUNITY): Payer: Self-pay

## 2023-02-28 DIAGNOSIS — M542 Cervicalgia: Secondary | ICD-10-CM | POA: Diagnosis not present

## 2023-02-28 DIAGNOSIS — S39012A Strain of muscle, fascia and tendon of lower back, initial encounter: Secondary | ICD-10-CM | POA: Insufficient documentation

## 2023-02-28 DIAGNOSIS — S32010A Wedge compression fracture of first lumbar vertebra, initial encounter for closed fracture: Secondary | ICD-10-CM

## 2023-02-28 DIAGNOSIS — W19XXXA Unspecified fall, initial encounter: Secondary | ICD-10-CM

## 2023-02-28 DIAGNOSIS — Y92094 Garage of other non-institutional residence as the place of occurrence of the external cause: Secondary | ICD-10-CM | POA: Insufficient documentation

## 2023-02-28 DIAGNOSIS — Z853 Personal history of malignant neoplasm of breast: Secondary | ICD-10-CM | POA: Insufficient documentation

## 2023-02-28 DIAGNOSIS — S3992XA Unspecified injury of lower back, initial encounter: Secondary | ICD-10-CM | POA: Diagnosis present

## 2023-02-28 DIAGNOSIS — Y9301 Activity, walking, marching and hiking: Secondary | ICD-10-CM | POA: Diagnosis not present

## 2023-02-28 DIAGNOSIS — W108XXA Fall (on) (from) other stairs and steps, initial encounter: Secondary | ICD-10-CM | POA: Insufficient documentation

## 2023-02-28 DIAGNOSIS — S99921A Unspecified injury of right foot, initial encounter: Secondary | ICD-10-CM | POA: Insufficient documentation

## 2023-02-28 MED ORDER — HYDROMORPHONE HCL 1 MG/ML IJ SOLN
1.0000 mg | Freq: Once | INTRAMUSCULAR | Status: AC
  Administered 2023-02-28: 1 mg via INTRAVENOUS
  Filled 2023-02-28: qty 1

## 2023-02-28 MED ORDER — ONDANSETRON HCL 4 MG/2ML IJ SOLN
4.0000 mg | Freq: Once | INTRAMUSCULAR | Status: AC
  Administered 2023-02-28: 4 mg via INTRAVENOUS
  Filled 2023-02-28: qty 2

## 2023-02-28 MED ORDER — HYDROCODONE-ACETAMINOPHEN 5-325 MG PO TABS: 1.0000 | ORAL_TABLET | Freq: Four times a day (QID) | ORAL | 0 refills | Status: DC | PRN

## 2023-02-28 NOTE — Discharge Instructions (Signed)
Take hydrocodone as needed for pain.  Make an appointment to follow-up with Dr. Romeo Apple from orthopedic standpoint.  Work note provided.  Okay to walk on your foot.  Also the compression fracture in the lumbar spine is stable.  But you can expect that to be painful may be exacerbated by walking.  Rest is much as possible.

## 2023-02-28 NOTE — ED Triage Notes (Signed)
Pt was walking out of the garage, slipped on wet flooring. C/O low back pain and pain in her R second toe.

## 2023-02-28 NOTE — ED Notes (Signed)
Pt has removed her c-collar again. Complaint of worsening low back pain.

## 2023-02-28 NOTE — ED Notes (Signed)
Patient transported to CT via stretcher.

## 2023-02-28 NOTE — ED Provider Notes (Addendum)
EMERGENCY DEPARTMENT AT [facility] Provider Note   CSN: [id number] Arrival date & time: 02/28/23  1035     History  Chief Complaint  Patient presents with   Deanna [name] is a 62 y.o. female.  Patient shortly prior to arrival fell down the final 1-2 steps going into the basement and landed on her low back.  Pain to the low back.  Patient arrived with c-collar on from EMS.  But not complaining of any neck pain did not hit her head no loss of consciousness.  Also has complaint of pain to the second toe of the right foot.  But the main pain is low back.  Past medical history significant for gastroesophageal reflux disease diverticulosis history of migraines history of breast cancer.  Past surgical history significant for abdominal hysterectomy cholecystectomy.  Patient is a former smoker quit in 2002.  Patient denies any upper back pain any hip pain any upper extremity pain.  No chest pain or shortness of breath no abdominal pain.       Home Medications Prior to Admission medications   Medication Sig Start Date End Date Taking? Authorizing Provider  ALPRAZolam Prudy Feeler) 1 MG tablet Take 1 tablet (1 mg total) by mouth 2 (two) times daily. 01/30/23   Daphine Deutscher, Mary-Margaret, FNP  amLODipine (NORVASC) 5 MG tablet Take 1 tablet (5 mg total) by mouth daily. (NEEDS TO BE SEEN BEFORE NEXT REFILL) 01/30/23   Daphine Deutscher, Mary-Margaret, FNP  buPROPion (WELLBUTRIN XL) 150 MG 24 hr tablet Take 3 tablets (450 mg total) by mouth daily. 01/30/23   Daphine Deutscher Mary-Margaret, FNP  CALCIUM-VITAMIN D PO Take 1 tablet by mouth. 600 mg of calcium Patient not taking: Reported on 01/30/2023    [provider]  fluticasone (FLONASE) 50 MCG/ACT nasal spray Place 2 sprays into both nostrils daily. 09/06/19   Remus Loffler, PA-C  fluticasone-salmeterol (ADVAIR DISKUS) 250-50 MCG/ACT AEPB Inhale 1 puff into the lungs in the morning and at bedtime. 01/30/23   Daphine Deutscher, Mary-Margaret, FNP  furosemide  (LASIX) 20 MG tablet Take 1 tablet (20 mg total) by mouth daily. (NEEDS TO BE SEEN BEFORE NEXT REFILL) 01/30/23   Bennie Pierini, FNP  montelukast (SINGULAIR) 10 MG tablet TAKE ONE TABLET BY MOUTH AT BEDTIME. 11/24/22   Daphine Deutscher, Mary-Margaret, FNP  Multiple Vitamin (MULTIVITAMIN) tablet Take 1 tablet by mouth daily.    [provider]  olmesartan (BENICAR) 40 MG tablet Take 1 tablet (40 mg total) by mouth every morning. (NEEDS TO BE SEEN BEFORE NEXT REFILL) 01/30/23   Bennie Pierini, FNP  omeprazole (PRILOSEC) 40 MG capsule Take 1 capsule (40 mg total) by mouth daily. (NEEDS TO BE SEEN BEFORE NEXT REFILL) 01/30/23   Bennie Pierini, FNP  SUMAtriptan (IMITREX) 100 MG tablet TAKE 1 TABLET ONCE DAILY AS NEEDED FOR MIGRAINE AS DIRECTED BY PHYSICIAN. 01/30/23   Daphine Deutscher, Mary-Margaret, FNP  valACYclovir (VALTREX) 1000 MG tablet TAKE  (1)  TABLET  EVERY TWELVE HOURS. 03/18/19   Remus Loffler, PA-C  VENTOLIN HFA 108 (90 Base) MCG/ACT inhaler Inhale 2 puffs into the lungs every 6 (six) hours as needed for wheezing or shortness of breath. 06/20/19   Remus Loffler, PA-C      Allergies    Iodinated contrast media, Iodine, Iohexol, and Clindamycin/lincomycin    Review of Systems   Review of Systems  Constitutional:  Negative for chills and fever.  HENT:  Negative for ear pain and sore throat.  Eyes:  Negative for pain and visual disturbance.  Respiratory:  Negative for cough and shortness of breath.   Cardiovascular:  Negative for chest pain and palpitations.  Gastrointestinal:  Negative for abdominal pain and vomiting.  Genitourinary:  Negative for dysuria and hematuria.  Musculoskeletal:  Positive for back pain. Negative for arthralgias.  Skin:  Negative for color change and rash.  Neurological:  Negative for seizures and syncope.  All other systems reviewed and are negative.   Physical Exam Updated Vital Signs BP 131/75   Pulse (!) 58   Temp 97.8 F (36.6 C) (Oral)   Resp  16   Ht 1.626 m (5\' 4" )   Wt 111.1 kg   SpO2 99%   BMI 42.05 kg/m  Physical Exam Vitals and nursing note reviewed.  Constitutional:      General: She is not in acute distress.    Appearance: Normal appearance. She is well-developed.  HENT:     Head: Normocephalic and atraumatic.     Mouth/Throat:     Mouth: Mucous membranes are moist.  Eyes:     Extraocular Movements: Extraocular movements intact.     Conjunctiva/sclera: Conjunctivae normal.     Pupils: Pupils are equal, round, and reactive to light.  Neck:     Comments: No tenderness to palpation to the posterior part of the neck. Cardiovascular:     Rate and Rhythm: Normal rate and regular rhythm.     Heart sounds: No murmur heard. Pulmonary:     Effort: Pulmonary effort is normal. No respiratory distress.     Breath sounds: Normal breath sounds.  Abdominal:     Palpations: Abdomen is soft.     Tenderness: There is no abdominal tenderness.  Musculoskeletal:        General: Tenderness and signs of injury present. No swelling.     Cervical back: Normal range of motion and neck supple. No rigidity or tenderness.     Comments: Right foot second big toe with a little bit of swelling and tenderness no obvious deformity.  Good cap refill to the feet bilaterally and dorsalis pedis pulses 1+.  Skin:    General: Skin is warm and dry.     Capillary Refill: Capillary refill takes less than 2 seconds.  Neurological:     General: No focal deficit present.     Mental Status: She is alert and oriented to person, place, and time.     Cranial Nerves: No cranial nerve deficit.     Sensory: No sensory deficit.     Motor: No weakness.  Psychiatric:        Mood and Affect: Mood normal.     ED Results / Procedures / Treatments   Labs (all labs ordered are listed, but only abnormal results are displayed) Labs Reviewed - No data to display  EKG None  Radiology CT Lumbar Spine Wo Contrast  Result Date: 02/28/2023 CLINICAL DATA:   Back trauma with pain. EXAM: CT LUMBAR SPINE WITHOUT CONTRAST TECHNIQUE: Multidetector CT imaging of the lumbar spine was performed without intravenous contrast administration. Multiplanar CT image reconstructions were also generated. RADIATION DOSE REDUCTION: This exam was performed according to the departmental dose-optimization program which includes automated exposure control, adjustment of the mA and/or kV according to patient size and/or use of iterative reconstruction technique. COMPARISON:  None Available. FINDINGS: Segmentation: 5 lumbar type vertebrae. Alignment: Normal. Vertebrae: L1 acute compression fracture fracture L1 acute or subacute compression fracture with mild superior endplate depression. No retropulsion  or evidence of bone lesion. Subjective osteopenia. Paraspinal and other soft tissues: Negative for perispinal hematoma. Disc levels: Mild degenerative facet spurring asymmetric to the right at L3-4 and L4-5. IMPRESSION: Acute or subacute L1 compression fracture with mild height loss. Electronically Signed   By: Tiburcio Pea M.D.   On: 02/28/2023 12:56    Procedures Procedures    Medications Ordered in ED Medications  HYDROmorphone (DILAUDID) injection 1 mg (has no administration in time range)  ondansetron (ZOFRAN) injection 4 mg (has no administration in time range)    ED Course/ Medical Decision Making/ A&P                                 Medical Decision Making Amount and/or Complexity of Data Reviewed Radiology: ordered.  Risk Prescription drug management.  Patient with fall no loss of conscious no neck pain.  But does have a lot of low back pain.  No neurodeficits to the lower extremities.  Will go ahead and get CT of lumbar spine and will get x-ray of the right foot.  X-ray of the left foot without any bony abnormalities.  So that is probably more of a sprain.  Patient can ambulate with a firm sole shoe.  CT scan of the lumbar spine shows evidence for probably  an acute L1 compression fracture with mild superior endplate depression.  Based on clinical correlation this is probably acute.  Will treat with pain medication and have patient follow-up with orthopedics.   Final Clinical Impression(s) / ED Diagnoses Final diagnoses:  Fall, initial encounter  Lumbar strain, initial encounter  Foot injury, right, initial encounter    Rx / DC Orders ED Discharge Orders     None         Vanetta Mulders, MD 02/28/23 1258    Vanetta Mulders, MD 02/28/23 1355

## 2023-03-02 ENCOUNTER — Telehealth: Payer: Self-pay

## 2023-03-02 NOTE — Telephone Encounter (Signed)
Transition Care Management Follow-up Telephone Call Date of discharge and from where: 02/28/2023 from [facility] ER How have you been since you were released from the hospital? Still having a lof of pain, resting, using heat and ice, and taking Vicodin  Any questions or concerns? No  Items Reviewed: Did the pt receive and understand the discharge instructions provided? Yes  Medications obtained and verified? Yes  Other?  N/A Any new allergies since your discharge? No  Dietary orders reviewed? N/A Do you have support at home? Yes   Home Care and Equipment/Supplies: Were home health services ordered? no If so, what is the name of the agency? N/A  Has the agency set up a time to come to the patient's home? not applicable Were any new equipment or medical supplies ordered?  No What is the name of the medical supply agency? N/A Were you able to get the supplies/equipment? not applicable Do you have any questions related to the use of the equipment or supplies? N/A  Functional Questionnaire: (I = Independent and D = Dependent) ADLs: I  Bathing/Dressing- I  Meal Prep- D  Eating- I  Maintaining continence- I  Transferring/Ambulation- I  Managing Meds- I  Follow up appointments reviewed:  PCP Hospital f/u appt confirmed? No patient does not wish to see PCP for ER follow up because she has appointment with specialist next week Specialist Hospital f/u appt confirmed? Yes patient does have an appointment with orthopedic doctor next Tuesday, 03/10/23, but she cannot remember who it is. Are transportation arrangements needed? No  If their condition worsens, is the pt aware to call PCP or go to the Emergency Dept.? Yes Was the patient provided with contact information for the PCP's office or ED? Yes Was to pt encouraged to call back with questions or concerns? Yes

## 2023-03-24 ENCOUNTER — Other Ambulatory Visit: Payer: Self-pay | Admitting: Specialist

## 2023-03-24 DIAGNOSIS — M5459 Other low back pain: Secondary | ICD-10-CM

## 2023-04-12 ENCOUNTER — Other Ambulatory Visit: Payer: Self-pay | Admitting: Nurse Practitioner

## 2023-04-12 DIAGNOSIS — F320 Major depressive disorder, single episode, mild: Secondary | ICD-10-CM

## 2023-04-19 ENCOUNTER — Ambulatory Visit
Admission: RE | Admit: 2023-04-19 | Discharge: 2023-04-19 | Disposition: A | Discharge status: Home / Self Care | Payer: 59 | Source: Ambulatory Visit | Attending: Specialist | Admitting: Specialist

## 2023-04-19 DIAGNOSIS — M5459 Other low back pain: Secondary | ICD-10-CM

## 2023-05-04 ENCOUNTER — Other Ambulatory Visit: Payer: Self-pay | Admitting: Specialist

## 2023-05-04 DIAGNOSIS — S32010G Wedge compression fracture of first lumbar vertebra, subsequent encounter for fracture with delayed healing: Secondary | ICD-10-CM

## 2023-05-06 ENCOUNTER — Other Ambulatory Visit: Payer: 59

## 2023-05-12 ENCOUNTER — Other Ambulatory Visit: Payer: Self-pay | Admitting: Nurse Practitioner

## 2023-05-12 DIAGNOSIS — K219 Gastro-esophageal reflux disease without esophagitis: Secondary | ICD-10-CM

## 2023-05-14 ENCOUNTER — Other Ambulatory Visit: Payer: Self-pay | Admitting: Nurse Practitioner

## 2023-05-14 DIAGNOSIS — I10 Essential (primary) hypertension: Secondary | ICD-10-CM

## 2023-05-20 ENCOUNTER — Other Ambulatory Visit: Payer: Self-pay | Admitting: Interventional Radiology

## 2023-05-20 ENCOUNTER — Other Ambulatory Visit: Payer: Self-pay | Admitting: Nurse Practitioner

## 2023-05-20 ENCOUNTER — Ambulatory Visit (INDEPENDENT_AMBULATORY_CARE_PROVIDER_SITE_OTHER): Payer: 59

## 2023-05-20 ENCOUNTER — Ambulatory Visit
Admission: RE | Admit: 2023-05-20 | Discharge: 2023-05-20 | Disposition: A | Discharge status: Home / Self Care | Payer: 59 | Source: Ambulatory Visit | Attending: Specialist | Admitting: Specialist

## 2023-05-20 DIAGNOSIS — Z13820 Encounter for screening for osteoporosis: Secondary | ICD-10-CM

## 2023-05-20 DIAGNOSIS — Z780 Asymptomatic menopausal state: Secondary | ICD-10-CM

## 2023-05-20 DIAGNOSIS — S32010G Wedge compression fracture of first lumbar vertebra, subsequent encounter for fracture with delayed healing: Secondary | ICD-10-CM

## 2023-05-20 HISTORY — PX: IR RADIOLOGIST EVAL & MGMT: IMG5224

## 2023-05-20 NOTE — Consult Note (Signed)
Chief Complaint: Back pain  Referring Physician(s): [name],Deanna  PCP: Deanna [name] Family  History of Present Illness: Deanna [name] is a 62 y.o. female presenting as a scheduled consultation to [facility] clinic today, kindly referred by Dr. Shelle [name] of [facility].    [name] is here today by herself.   She tells me that on Friday night August 2nd, she was going down steps into the basement and slipped on a wet floor.  She landed on her bottom causing the injury.  She was unable to walk, stating she needed to crawl up the steps. She was brought by ambulance for ED visit.  CT done 02/28/23 confirms an L1 compression fracture. She was discharged from ED with pain medication.  2 weeks later she was seen by ortho office.  She was given back brace and other pain medication.  She has been wearing the brace everyday since then.  She tells me that she continues to take "12 ibuprofen daily" but no other pain medication bc it caused significant nausea and constipation.  She has been on disability since then unable to work bc of pain.  She denies any lower extremity weakness.  Pain continues to be 8/10 intensity at worst, and at best 5/10 intensity.   This is affecting everything she does during the day, significantly affecting her ADL's.  She designates 18/24 on the [employer] disability scale.   MRI was then done 9/22 showing ongoing edema at the L1 fracture site.       She denies any history of MI or stroke. Denies chest pain.   Past Medical History:  Diagnosis Date   Cancer (HCC)    breast   Diverticulosis of sigmoid colon 07/14/2011   GERD (gastroesophageal reflux disease)    Migraine    PONV (postoperative nausea and vomiting)    Tubular adenoma of colon     Past Surgical History:  Procedure Laterality Date   ABDOMINAL HYSTERECTOMY  02/2010   partial   BREAST REDUCTION SURGERY  02/2011   BREAST SURGERY     cancer   CHOLECYSTECTOMY  07/17/11    w/IOC   COLONOSCOPY     NASAL SINUS SURGERY  02/2011   TONSILLECTOMY  age 82    Allergies: Iodinated contrast media, Iodine, Iohexol, and Clindamycin/lincomycin  Medications: Prior to Admission medications   Medication Sig Start Date End Date Taking? Authorizing Provider  ALPRAZolam Prudy Feeler) 1 MG tablet Take 1 tablet (1 mg total) by mouth 2 (two) times daily. 01/30/23  Yes Matus, Mary-Margaret, FNP  amLODipine (NORVASC) 5 MG tablet TAKE ONE TABLET BY MOUTH DAILY. 05/14/23  Yes Heide, Mary-Margaret, FNP  buPROPion (WELLBUTRIN XL) 150 MG 24 hr tablet TAKE THREE TABLETS ONCE DAILY 04/13/23  Yes Daphine Deutscher, Mary-Margaret, FNP  fluticasone (FLONASE) 50 MCG/ACT nasal spray Place 2 sprays into both nostrils daily. 09/06/19  Yes Remus Loffler, PA-C  fluticasone-salmeterol (ADVAIR DISKUS) 250-50 MCG/ACT AEPB Inhale 1 puff into the lungs in the morning and at bedtime. 01/30/23  Yes Daphine Deutscher, Mary-Margaret, FNP  furosemide (LASIX) 20 MG tablet TAKE ONE TABLET BY MOUTH DAILY. 05/14/23  Yes Mcconico, Mary-Margaret, FNP  montelukast (SINGULAIR) 10 MG tablet TAKE ONE TABLET BY MOUTH AT BEDTIME. 05/14/23  Yes Daphine Deutscher, Mary-Margaret, FNP  Multiple Vitamin (MULTIVITAMIN) tablet Take 1 tablet by mouth daily.   Yes [provider]  olmesartan (BENICAR) 40 MG tablet TAKE ONE TABLET ONCE DAILY IN THE MORNING 05/14/23  Yes Daphine Deutscher, Mary-Margaret, FNP  omeprazole (PRILOSEC) 40  MG capsule TAKE ONE CAPSULE BY MOUTH DAILY. (dr note: see doctor before next refill) 05/12/23  Yes Daphine Deutscher, Mary-Margaret, FNP  SUMAtriptan (IMITREX) 100 MG tablet TAKE 1 TABLET ONCE DAILY AS NEEDED FOR MIGRAINE AS DIRECTED BY PHYSICIAN. 01/30/23  Yes Daphine Deutscher, Mary-Margaret, FNP  VENTOLIN HFA 108 (90 Base) MCG/ACT inhaler Inhale 2 puffs into the lungs every 6 (six) hours as needed for wheezing or shortness of breath. 06/20/19  Yes Remus Loffler, PA-C  CALCIUM-VITAMIN D PO Take 1 tablet by mouth. 600 mg of calcium Patient not taking: Reported on  01/30/2023    [provider]  HYDROcodone-acetaminophen (NORCO/VICODIN) 5-325 MG tablet Take 1-2 tablets by mouth every 6 (six) hours as needed for moderate pain. Patient not taking: Reported on 05/20/2023 02/28/23   Vanetta Mulders, MD  valACYclovir (VALTREX) 1000 MG tablet TAKE  (1)  TABLET  EVERY TWELVE HOURS. Patient not taking: Reported on 05/20/2023 03/18/19   Remus Loffler, PA-C     Family History  Problem Relation Age of Onset   Mental illness Mother    Stroke Mother        died with this at age 55   Heart disease Mother    Hyperlipidemia Mother    Alcohol abuse Father    Cancer Paternal Grandmother        colon   Colon cancer Paternal Grandmother    Alcohol abuse Brother    Asthma Brother    Hypertension Brother    Esophageal cancer Neg Hx    Rectal cancer Neg Hx    Stomach cancer Neg Hx     Social History   Socioeconomic History   Marital status: Divorced    Spouse name: Not on file   Number of children: 1   Years of education: Not on file   Highest education level: Associate degree: occupational, Scientist, product/process development, or vocational program  Occupational History   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Development worker, international aid  Tobacco Use   Smoking status: Former    Current packs/day: 0.00    Average packs/day: 0.3 packs/day for 2.0 years (0.5 ttl pk-yrs)    Types: Cigarettes    Start date: 07/28/1998    Quit date: 07/28/2000    Years since quitting: 22.8   Smokeless tobacco: Never  Vaping Use   Vaping status: Never Used  Substance and Sexual Activity   Alcohol use: No   Drug use: No   Sexual activity: Not Currently  Other Topics Concern   Not on file  Social History Narrative   Not on file   Social Determinants of Health   Financial Resource Strain: Patient Declined (01/26/2023)   Overall Financial Resource Strain (CARDIA)    Difficulty of Paying Living Expenses: Patient declined  Food Insecurity: No Food Insecurity (01/26/2023)   Hunger Vital Sign    Worried About Running Out  of Food in the Last Year: Never true    Ran Out of Food in the Last Year: Never true  Transportation Needs: No Transportation Needs (01/26/2023)   PRAPARE - Administrator, Civil Service (Medical): No    Lack of Transportation (Non-Medical): No  Physical Activity: Unknown (01/26/2023)   Exercise Vital Sign    Days of Exercise per Week: 0 days    Minutes of Exercise per Session: Not on file  Stress: Stress Concern Present (01/26/2023)   [employer] of Occupational Health - Occupational Stress Questionnaire    Feeling of Stress : Rather much  Social Connections: Unknown (01/26/2023)  Social Advertising account executive [NHANES]    Frequency of Communication with Friends and Family: More than three times a week    Frequency of Social Gatherings with Friends and Family: Twice a week    Attends Religious Services: Patient declined    Database administrator or Organizations: No    Attends Engineer, structural: Not on file    Marital Status: Separated       Review of Systems: A 12 point ROS discussed and pertinent positives are indicated in the HPI above.  All other systems are negative.  Review of Systems  Vital Signs: BP (!) 163/94   Pulse 75   Temp (!) 97.4 F (36.3 C)   Resp 16   SpO2 96%     Physical Exam General: 62 yo female appearing stated age.  Well-developed, well-nourished.  No distress. HEENT: Atraumatic, normocephalic.  Conjugate gaze, extra-ocular motor intact. No scleral icterus or scleral injection. No lesions on external ears, nose, lips, or gums.  Oral mucosa moist, pink.  Neck: Symmetric with no goiter enlargement.  Chest/Lungs:  Symmetric chest with inspiration/expiration.  No labored breathing.  Clear to auscultation with no wheezes, rhonchi, or rales.  Heart:  RRR, with no third heart sounds appreciated. No JVD appreciated.  Abdomen:  Soft, NT/ND, with + bowel sounds.   Genito-urinary: Deferred Neurologic: Alert & Oriented to person,  place, and time.   Normal affect and insight.  Appropriate questions.  Moving all 4 extremities with gross sensory intact.  Pulse Exam:  Palpable bilateral PT Extremities: No wound.  No swelling MSK: Brace in place at chest/lower back.  No step off or crepitus.  TTP at the T-L junction    Imaging: No results found.  Labs:  CBC: Recent Labs    10/27/22 1028  WBC 6.6  HGB 14.3  HCT 41.7  PLT 370    COAGS: No results for input(s): "INR", "APTT" in the last 8760 hours.  BMP: Recent Labs    10/27/22 1028  NA 141  K 4.4  CL 106  CO2 20  GLUCOSE 115*  BUN 20  CALCIUM 9.1  CREATININE 0.93    LIVER FUNCTION TESTS: Recent Labs    10/27/22 1028  BILITOT 0.3  AST 22  ALT 29  ALKPHOS 94  PROT 6.6  ALBUMIN 4.4    TUMOR MARKERS: No results for input(s): "AFPTM", "CEA", "CA199", "CHROMGRNA" in the last 8760 hours.  Assessment and Plan:  Ms Pinzone is 62 yo female with symptomatic L1 compression fracture after fall, with ongoing life-style limiting pain after trial of conservative therapy.    She is unable to work, and unable to participate with PT given her symptoms.   We discussed image guided vertebral augmentation, specifically KP, as a good option for her to improve her symptoms.  Primary goal is of course pain relief, with secondary goals of improving her comfort with ADL's, restoring function, and decreasing need for strong pain medications, of which she has experienced side effects.   Logistics of the treatment were discussed, including risk/benefit analysis.  Risks include bleeding, infection, migration of cement/embolization, nerve injury or other local injury, need for further procedure/surgery, need for hospitalization, anesthesia risk, cardiopulmonary collapse, death.   She understands, and would like to proceed with treatment.   Plan: - plan for image guided L1 vertebral augmentation/KP, with Dr. Loreta Ave - plan for DEXA study - continue current care for  symptoms  Thank you for this interesting consult.  I  greatly enjoyed meeting Deanna [name] and look forward to participating in their care.  A copy of this report was sent to the requesting provider on this date.  Electronically Signed: Gilmer Mor 05/20/2023, 9:29 AM   I spent a total of  40 Minutes   in face to face in clinical consultation, reviewing notes, reviewing images, reviewing labs, greater than 50% of which was counseling/coordinating care for new acute uncomplicated injury, L1 compression fracture, possible image guided vertebral augmentation.

## 2023-05-21 ENCOUNTER — Encounter: Payer: Self-pay | Admitting: Specialist

## 2023-05-21 ENCOUNTER — Telehealth: Payer: Self-pay | Admitting: Nurse Practitioner

## 2023-05-21 DIAGNOSIS — Z13820 Encounter for screening for osteoporosis: Secondary | ICD-10-CM

## 2023-05-21 DIAGNOSIS — S32010G Wedge compression fracture of first lumbar vertebra, subsequent encounter for fracture with delayed healing: Secondary | ICD-10-CM

## 2023-05-21 LAB — CBC
HCT: 48 % — ABNORMAL HIGH (ref 35.0–45.0)
Hemoglobin: 15.5 g/dL (ref 11.7–15.5)
MCH: 29.8 pg (ref 27.0–33.0)
MCHC: 32.3 g/dL (ref 32.0–36.0)
MCV: 92.1 fL (ref 80.0–100.0)
MPV: 9.6 fL (ref 7.5–12.5)
Platelets: 362 10*3/uL (ref 140–400)
RBC: 5.21 10*6/uL — ABNORMAL HIGH (ref 3.80–5.10)
RDW: 13.2 % (ref 11.0–15.0)
WBC: 7.2 10*3/uL (ref 3.8–10.8)

## 2023-05-21 LAB — HOUSE ACCOUNT TRACKING

## 2023-05-21 LAB — COMPLETE METABOLIC PANEL WITH GFR
AG Ratio: 1.7 (calc) (ref 1.0–2.5)
ALT: 21 U/L (ref 6–29)
AST: 23 U/L (ref 10–35)
Albumin: 4.4 g/dL (ref 3.6–5.1)
Alkaline phosphatase (APISO): 84 U/L (ref 37–153)
BUN: 19 mg/dL (ref 7–25)
CO2: 20 mmol/L (ref 20–32)
Calcium: 9.2 mg/dL (ref 8.6–10.4)
Chloride: 99 mmol/L (ref 98–110)
Creat: 1.04 mg/dL (ref 0.50–1.05)
Globulin: 2.6 g/dL (ref 1.9–3.7)
Potassium: 3.4 mmol/L — ABNORMAL LOW (ref 3.5–5.3)
Sodium: 144 mmol/L (ref 135–146)
Total Bilirubin: 0.5 mg/dL (ref 0.2–1.2)
Total Protein: 7 g/dL (ref 6.1–8.1)
eGFR: 61 mL/min/{1.73_m2} (ref >=60)

## 2023-05-21 LAB — SPECIMEN COMPROMISED

## 2023-05-21 LAB — PROTIME-INR
INR: 0.9
Prothrombin Time: 10.1 s (ref 9.0–11.5)

## 2023-05-21 NOTE — Telephone Encounter (Signed)
I was waiting on reading from [facility] - sent it as soon as it came through.

## 2023-05-21 NOTE — Telephone Encounter (Signed)
Dr Marcille Buffy assistant called requesting patients bone density results be sent to them ASAP. They are trying to get patient approved to have procedure next week.   Please advise.

## 2023-05-22 ENCOUNTER — Other Ambulatory Visit: Payer: Self-pay | Admitting: Interventional Radiology

## 2023-05-22 DIAGNOSIS — S32010G Wedge compression fracture of first lumbar vertebra, subsequent encounter for fracture with delayed healing: Secondary | ICD-10-CM

## 2023-05-22 DIAGNOSIS — M8008XA Age-related osteoporosis with current pathological fracture, vertebra(e), initial encounter for fracture: Secondary | ICD-10-CM

## 2023-05-22 NOTE — Telephone Encounter (Signed)
Verified that report was received

## 2023-05-25 ENCOUNTER — Other Ambulatory Visit: Payer: Self-pay | Admitting: Nurse Practitioner

## 2023-05-25 DIAGNOSIS — F320 Major depressive disorder, single episode, mild: Secondary | ICD-10-CM

## 2023-05-27 NOTE — Discharge Instructions (Signed)
Kyphoplasty Post Procedure Discharge Instructions ? ?May resume a regular diet and any medications that you routinely take (including pain medications). However, if you are taking Aspirin or an anticoagulant/blood thinner you will be told when you can resume taking these by the healthcare provider. ?No driving day of procedure. ?The day of your procedure take it easy. You may use an ice pack as needed to injection sites on back.  Ice to back 30 minutes on and 30 minutes off, as needed. ?May remove bandaids tomorrow after taking a shower. Replace daily with a clean bandaid until healed.  ?Do not lift anything heavier than a milk jug for 1-2 weeks or determined by your physician.  ?Follow up with your physician in 2 weeks. ? ? ? ?Please contact our office at [phone] for the following symptoms or if you have any questions: ? ?Fever greater than 100 degrees ?Increased swelling, pain, or redness at injection site. ?Increased back and/or leg pain ?New numbness or change in symptoms from before the procedure.  ? ? ?Thank you for visiting [facility].  ?

## 2023-05-28 ENCOUNTER — Ambulatory Visit
Admission: RE | Admit: 2023-05-28 | Discharge: 2023-05-28 | Disposition: A | Discharge status: Home / Self Care | Payer: 59 | Source: Ambulatory Visit | Attending: Interventional Radiology | Admitting: Interventional Radiology

## 2023-05-28 DIAGNOSIS — S32010G Wedge compression fracture of first lumbar vertebra, subsequent encounter for fracture with delayed healing: Secondary | ICD-10-CM

## 2023-05-28 DIAGNOSIS — M8008XA Age-related osteoporosis with current pathological fracture, vertebra(e), initial encounter for fracture: Secondary | ICD-10-CM

## 2023-05-28 HISTORY — PX: IR KYPHO LUMBAR INC FX REDUCE BONE BX UNI/BIL CANNULATION INC/IMAGING: IMG5519

## 2023-05-28 MED ORDER — KETOROLAC TROMETHAMINE 30 MG/ML IJ SOLN
30.0000 mg | Freq: Once | INTRAMUSCULAR | Status: AC
  Administered 2023-05-28: 30 mg via INTRAVENOUS

## 2023-05-28 MED ORDER — FENTANYL CITRATE PF 50 MCG/ML IJ SOSY
25.0000 ug | PREFILLED_SYRINGE | INTRAMUSCULAR | Status: DC | PRN
  Administered 2023-05-28 (×2): 25 ug via INTRAVENOUS
  Administered 2023-05-28 (×2): 50 ug via INTRAVENOUS

## 2023-05-28 MED ORDER — MIDAZOLAM HCL 2 MG/2ML IJ SOLN
1.0000 mg | INTRAMUSCULAR | Status: DC | PRN
  Administered 2023-05-28 (×2): 0.5 mg via INTRAVENOUS
  Administered 2023-05-28 (×2): 1 mg via INTRAVENOUS

## 2023-05-28 MED ORDER — SODIUM CHLORIDE 0.9 % IV SOLN: INTRAVENOUS | Status: DC

## 2023-05-28 MED ORDER — CEFAZOLIN SODIUM-DEXTROSE 2-4 GM/100ML-% IV SOLN
2.0000 g | INTRAVENOUS | Status: AC
  Administered 2023-05-28: 2 g via INTRAVENOUS

## 2023-05-28 MED ORDER — ONDANSETRON HCL 4 MG/2ML IJ SOLN
4.0000 mg | Freq: Once | INTRAMUSCULAR | Status: AC
  Administered 2023-05-28: 4 mg via INTRAVENOUS

## 2023-05-28 NOTE — Progress Notes (Signed)
Pt back in nursing recovery area. Pt still drowsy from procedure but will wake up when spoken to. Pt follows commands, talks in complete sentences and has no complaints at this time. Pt will remain in nurses station until discharged by Radiologist.   

## 2023-05-28 NOTE — Procedures (Signed)
Interventional Radiology Procedure Note  Procedure:   Image guided L1 vertebral augmentation, KP technique, via single left pedicular approach.   EBL: none Complications: None  Recommendations:  - Ok to shower tomorrow and remove bandage. Do not submerge for 10 days. - OTC's for pain - PRN ice pack for pain, on, off. - Advance diet - OK for DC when sedation recovery goals met - We will see her in the clinic for follow up in ~4 weeks.  She has our number if needed.   Signed,  Yvone Neu. Loreta Ave, DO

## 2023-05-28 NOTE — Progress Notes (Signed)
Interventional Radiology Preprocedure Note   [name] is 62 yo female with symptomatic L1 compression fracture after fall, with ongoing life-style limiting pain after trial of conservative therapy.    DEXA shows osteopenia.    She is unable to work, and unable to participate with PT given her symptoms.    We discussed image guided vertebral augmentation, specifically KP, as a good option for her to improve her symptoms.  Primary goal is of course pain relief, with secondary goals of improving her comfort with ADL's, restoring function, and decreasing need for strong pain medications, of which she has experienced side effects.    Logistics of the treatment were discussed, including risk/benefit analysis.  Risks include bleeding, infection, migration of cement/embolization, nerve injury or other local injury, need for further procedure/surgery, need for hospitalization, anesthesia risk, cardiopulmonary collapse, death.    Labs reviewed, May 31, 2023.  WNL for our purposes   She is feeling fine today, denies any symptoms of UTI, pneumonia.   She has history of "cardiac arrest" with iodinated contrast. She says this did not happen, and that she did have some respiratory symptoms.  I discussed with her the very low likelihood of balloon rupture with loss of contrast into the vetebral body, and thus possibly intravascular.  I do not think that this very small risk necessitates any premedication, as she would have needed at least 2 doses of prednisone.  She understands.  If a reaction, we will simply need to treat her if she becomes symptomatic.   Plan to proceed with L1 vertebral augmentation.     Signed,  Yvone Neu. Loreta Ave, DO

## 2023-06-04 ENCOUNTER — Telehealth: Payer: Self-pay

## 2023-06-04 ENCOUNTER — Other Ambulatory Visit: Payer: Self-pay | Admitting: Interventional Radiology

## 2023-06-04 DIAGNOSIS — S32010G Wedge compression fracture of first lumbar vertebra, subsequent encounter for fracture with delayed healing: Secondary | ICD-10-CM

## 2023-06-04 DIAGNOSIS — M8008XA Age-related osteoporosis with current pathological fracture, vertebra(e), initial encounter for fracture: Secondary | ICD-10-CM

## 2023-06-04 NOTE — Telephone Encounter (Signed)
Phone call to pt to follow up from her kyphoplasty on 05/28/23. Pt reports her pain is almost completely gone post procedure but is still having "a little soreness", pt states pain 85% improved. Getting around better and feels great.    Pt denies any signs of infection, no redness at the site, no draining or fever. Pt has no complaints at this time and will be scheduled for a telephone follow up with Dr. Loreta Ave next week.   Pt advised to call back if anything were to change or any concerns arise and we will arrange an in person appointment. Pt verbalized understanding.

## 2023-06-08 ENCOUNTER — Ambulatory Visit
Admission: RE | Admit: 2023-06-08 | Discharge: 2023-06-08 | Disposition: A | Discharge status: Home / Self Care | Payer: 59 | Source: Ambulatory Visit | Attending: Interventional Radiology | Admitting: Interventional Radiology

## 2023-06-08 DIAGNOSIS — S32010G Wedge compression fracture of first lumbar vertebra, subsequent encounter for fracture with delayed healing: Secondary | ICD-10-CM

## 2023-06-08 DIAGNOSIS — M8008XA Age-related osteoporosis with current pathological fracture, vertebra(e), initial encounter for fracture: Secondary | ICD-10-CM

## 2023-06-08 HISTORY — PX: IR RADIOLOGIST EVAL & MGMT: IMG5224

## 2023-06-08 NOTE — Progress Notes (Signed)
Chief Complaint: L1 compression fracture, SP treatment   Referring Physician(s): Deanna [name]   PCP: Deanna [name] Family   History of Present Illness: Deanna [name] is a 62 y.o. female presenting as a scheduled follow up to VIR clinic today SP treatment of L1 compression fracture.    Deanna [name] joins [place] today by way of virtual visit.  We confirmed her identity with 2 personal identifiers.    History:  We met Deanna [name] in the clinic 05/20/23  Friday night August 2nd, she was going down steps into the basement and slipped on a wet floor.  She landed on her bottom causing the injury.  She was unable to walk, stating she needed to crawl up the steps. She was brought by ambulance for ED visit.  CT done 02/28/23 confirms an L1 compression fracture. She was discharged from ED with pain medication.  2 weeks later she was seen by ortho office.  She was given back brace and other pain medication.  She has been wearing the brace everyday since then.  She tells me that she continues to take "12 ibuprofen daily" but no other pain medication bc it caused significant nausea and constipation.  She has been on disability since then unable to work bc of pain.  She denies any lower extremity weakness.  Pain continues to be 8/10 intensity at worst, and at best 5/10 intensity.   This is affecting everything she does during the day, significantly affecting her ADL's.  She designates 18/24 on the [employer] disability scale.    MRI was then done 9/22 showing ongoing edema at the L1 fracture site.          She denies any history of MI or stroke. Denies chest pain  Interval History: We treated Deanna [name] as outpatient 05/28/23, with vertebral augmentation, KP technique via unipedicular approach, left.  She was discharged home the same day after short recovery.   She tells me today that the excruciating pain that she started with is gone, but she still is having some  discomfort sitting for long stretch of time.  This is concerning to her, as she would like to sit more comfortably so she can work comfortably.  She tells me she continues to take 800mg  ibuprofen daily, decreased from 1200 or greater every day previously.  The pain at its worst, she says is 5/10 intensity.   She has no concerns for infection or healing problem at the access site.   She has upcoming appointment with orthopedic team.   Past Medical History:  Diagnosis Date   Cancer (HCC)    breast   Diverticulosis of sigmoid colon 07/14/2011   GERD (gastroesophageal reflux disease)    Migraine    PONV (postoperative nausea and vomiting)    Tubular adenoma of colon     Past Surgical History:  Procedure Laterality Date   ABDOMINAL HYSTERECTOMY  02/2010   partial   BREAST REDUCTION SURGERY  02/2011   BREAST SURGERY     cancer   CHOLECYSTECTOMY  07/17/11   w/IOC   COLONOSCOPY     IR KYPHO LUMBAR INC FX REDUCE BONE BX UNI/BIL CANNULATION INC/IMAGING  05/28/2023   IR RADIOLOGIST EVAL & MGMT  05/20/2023   NASAL SINUS SURGERY  02/2011   TONSILLECTOMY  age 39    Allergies: Iodinated contrast media, Iodine, Iohexol, and Clindamycin/lincomycin  Medications: Prior to Admission medications   Medication Sig Start Date End Date Taking? Authorizing Provider  ALPRAZolam (XANAX) 1 MG tablet Take 1 tablet (1 mg total) by mouth 2 (two) times daily. 01/30/23   Daphine Deutscher, Mary-Margaret, FNP  amLODipine (NORVASC) 5 MG tablet TAKE ONE TABLET BY MOUTH DAILY. 05/14/23   Daphine Deutscher Mary-Margaret, FNP  buPROPion (WELLBUTRIN XL) 150 MG 24 hr tablet TAKE THREE TABLETS ONCE DAILY 05/25/23   Daphine Deutscher, Mary-Margaret, FNP  CALCIUM-VITAMIN D PO Take 1 tablet by mouth. 600 mg of calcium Patient not taking: Reported on 01/30/2023    [provider]  fluticasone (FLONASE) 50 MCG/ACT nasal spray Place 2 sprays into both nostrils daily. 09/06/19   Remus Loffler, PA-C  fluticasone-salmeterol (ADVAIR DISKUS) 250-50  MCG/ACT AEPB Inhale 1 puff into the lungs in the morning and at bedtime. 01/30/23   Daphine Deutscher, Mary-Margaret, FNP  furosemide (LASIX) 20 MG tablet TAKE ONE TABLET BY MOUTH DAILY. 05/14/23   Daphine Deutscher Mary-Margaret, FNP  HYDROcodone-acetaminophen (NORCO/VICODIN) 5-325 MG tablet Take 1-2 tablets by mouth every 6 (six) hours as needed for moderate pain. Patient not taking: Reported on 05/20/2023 02/28/23   Vanetta Mulders, MD  montelukast (SINGULAIR) 10 MG tablet TAKE ONE TABLET BY MOUTH AT BEDTIME. 05/14/23   Daphine Deutscher, Mary-Margaret, FNP  Multiple Vitamin (MULTIVITAMIN) tablet Take 1 tablet by mouth daily.    [provider]  olmesartan (BENICAR) 40 MG tablet TAKE ONE TABLET ONCE DAILY IN THE MORNING 05/14/23   Daphine Deutscher, Mary-Margaret, FNP  omeprazole (PRILOSEC) 40 MG capsule TAKE ONE CAPSULE BY MOUTH DAILY. (dr note: see doctor before next refill) 05/12/23   Bennie Pierini, FNP  SUMAtriptan (IMITREX) 100 MG tablet TAKE 1 TABLET ONCE DAILY AS NEEDED FOR MIGRAINE AS DIRECTED BY PHYSICIAN. 01/30/23   Daphine Deutscher, Mary-Margaret, FNP  valACYclovir (VALTREX) 1000 MG tablet TAKE  (1)  TABLET  EVERY TWELVE HOURS. Patient not taking: Reported on 05/20/2023 03/18/19   Remus Loffler, PA-C  VENTOLIN HFA 108 (90 Base) MCG/ACT inhaler Inhale 2 puffs into the lungs every 6 (six) hours as needed for wheezing or shortness of breath. 06/20/19   Remus Loffler, PA-C     Family History  Problem Relation Age of Onset   Mental illness Mother    Stroke Mother        died with this at age 15   Heart disease Mother    Hyperlipidemia Mother    Alcohol abuse Father    Cancer Paternal Grandmother        colon   Colon cancer Paternal Grandmother    Alcohol abuse Brother    Asthma Brother    Hypertension Brother    Esophageal cancer Neg Hx    Rectal cancer Neg Hx    Stomach cancer Neg Hx     Social History   Socioeconomic History   Marital status: Divorced    Spouse name: Not on file   Number of children: 1    Years of education: Not on file   Highest education level: Associate degree: occupational, Scientist, product/process development, or vocational program  Occupational History   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Development worker, international aid  Tobacco Use   Smoking status: Former    Current packs/day: 0.00    Average packs/day: 0.3 packs/day for 2.0 years (0.5 ttl pk-yrs)    Types: Cigarettes    Start date: 07/28/1998    Quit date: 07/28/2000    Years since quitting: 22.8   Smokeless tobacco: Never  Vaping Use   Vaping status: Never Used  Substance and Sexual Activity   Alcohol use: No   Drug use: No  Sexual activity: Not Currently  Other Topics Concern   Not on file  Social History Narrative   Not on file   Social Determinants of Health   Financial Resource Strain: Patient Declined (01/26/2023)   Overall Financial Resource Strain (CARDIA)    Difficulty of Paying Living Expenses: Patient declined  Food Insecurity: No Food Insecurity (01/26/2023)   Hunger Vital Sign    Worried About Running Out of Food in the Last Year: Never true    Ran Out of Food in the Last Year: Never true  Transportation Needs: No Transportation Needs (01/26/2023)   PRAPARE - Administrator, Civil Service (Medical): No    Lack of Transportation (Non-Medical): No  Physical Activity: Unknown (01/26/2023)   Exercise Vital Sign    Days of Exercise per Week: 0 days    Minutes of Exercise per Session: Not on file  Stress: Stress Concern Present (01/26/2023)   [employer] of Occupational Health - Occupational Stress Questionnaire    Feeling of Stress : Rather much  Social Connections: Unknown (01/26/2023)   Social Connection and Isolation Panel [NHANES]    Frequency of Communication with Friends and Family: More than three times a week    Frequency of Social Gatherings with Friends and Family: Twice a week    Attends Religious Services: Patient declined    Database administrator or Organizations: No    Attends Engineer, structural: Not on file     Marital Status: Separated       Review of Systems  Review of Systems: A 12 point ROS discussed and pertinent positives are indicated in the HPI above.  All other systems are negative.    Physical Exam No direct physical exam was performed (except for noted visual exam findings with Video Visits).    Vital Signs: There were no vitals taken for this visit.  Imaging: IR KYPHO LUMBAR INC FX REDUCE BONE BX UNI/BIL CANNULATION INC/IMAGING  Result Date: 05/28/2023 INDICATION: 62 year old female presents for treatment of L1 compression fracture EXAM: IR KYPHO VERTEBRAL LUMBAR AUGMENTATION COMPARISON:  MR 04/19/2023 MEDICATIONS: None ANESTHESIA/SEDATION: Moderate (conscious) sedation was employed during this procedure. A total of Versed 3.0150 mg and Fentanyl mcg was administered intravenously. Moderate Sedation Time: 24 minutes. The patient's level of consciousness and vital signs were monitored continuously by radiology nursing throughout the procedure under my direct supervision. FLUOROSCOPY TIME:  Fluoroscopy Time:  (65 mGy) COMPLICATIONS: None immediate. PROCEDURE: Following a full explanation of the procedure along with the potentially associated complications, a witnessed informed consent was obtained. Specific risks that were discussed included bleeding, infection, injury to adjacent structures, neurologic injury, embolization of cement within the veins, failure of the procedure to improve pain, need for further procedure/ surgery, cardiopulmonary collapse, death. The patient understands the risks and wishes to proceed. The patient was placed prone on the fluoroscopic table. Nasal oxygen was administered. Physiologic monitoring was performed throughout the duration of the procedure. The skin overlying the L1 region was prepped and draped in the usual sterile fashion. The L1 vertebral body was identified and the left pedicle was infiltrated with 1% lidocaine. This was then followed by the  advancement of an 8-gauge Medtronic needle through the left pedicle into the mid vertebral body. The bone auger was advanced confirming that the trajectory cross the midline. The pedicular cannula balloon was inflated under fluoroscopic observation. Methylmethacrylate mixture was then reconstituted. Under biplane intermittent fluoroscopy, the methylmethacrylate was then injected into the L1 vertebral body with  filling of the fracture cleft, and adequate left right and cranial caudal opacification. No extravasation was noted posteriorly into the spinal canal. No epidural venous contamination was seen. The needles were then removed. Hemostasis was achieved at the skin entry site. There were no acute complications. Patient tolerated the procedure well. The patient was observed in recovery until sedation goals met and then discharged home stable. [name] IMPRESSION: Status post treatment of L1 compression fracture with vertebral augmentation, kyphoplasty technique, via single left transpedicular approach. Signed, Yvone Neu. Miachel Roux, RPVI [facility] Electronically Signed   By: Gilmer Mor D.O.   On: 05/28/2023 10:25   DG [facility] DEXA  Result Date: 05/21/2023 EXAM: DUAL X-RAY ABSORPTIOMETRY (DXA) FOR BONE MINERAL DENSITY 05/21/2023 3:15 pm CLINICAL DATA:  62 year old Female Postmenopausal. post menopausal History of fragility fracture. History of vertebral fracture. TECHNIQUE: An axial (e.g., hips, spine) and/or appendicular (e.g., radius) exam was performed, as appropriate, using [employer] systems engineer at [facility]. Images are obtained for bone mineral density measurement and are not obtained for diagnostic purposes. [id number] Exclusions: L1 due to the T-score being greater than 1.0 higher than an adjacent vertebral level COMPARISON:  None. FINDINGS: Scan quality: Good. LUMBAR SPINE (L2-L4): BMD (in g/cm2): 1.168  T-score: -0.4 Z-score: -0.2 LEFT FEMORAL NECK: BMD (in g/cm2): 0.892 T-score: -1.1 Z-score: -0.5 LEFT TOTAL HIP: BMD (in g/cm2): 1.012 T-score: 0.0 Z-score: 0.2 RIGHT FEMORAL NECK: BMD (in g/cm2): 0.840 T-score: -1.4 Z-score: -0.9 RIGHT TOTAL HIP: BMD (in g/cm2): 0.984 T-score: -0.2 Z-score: 0.0 FRAX 10-YEAR PROBABILITY OF FRACTURE: FRAX not reported as the patient has a history of hip or vertebral fracture. IMPRESSION: Osteopenia based on BMD. Fracture risk is increased. Increased risk is based on low BMD, and history of vertebral fracture. RECOMMENDATIONS: 1. All patients should optimize calcium and vitamin D intake. 2. Consider FDA-approved medical therapies in postmenopausal women and men aged 66 years and older, based on the following: - A hip or vertebral (clinical or morphometric) fracture - T-score less than or equal to -2.5 and secondary causes have been excluded. - Low bone mass (T-score between -1.0 and -2.5) and a 10-year probability of a hip fracture greater than or equal to 3% or a 10-year probability of a major osteoporosis-related fracture greater than or equal to 20% based on the [place]-adapted WHO algorithm. - Clinician judgment and/or patient preferences may indicate treatment for people with 10-year fracture probabilities above or below these levels 3. Patients with diagnosis of osteoporosis or at high risk for fracture should have regular bone mineral density tests. For patients eligible for Medicare, routine testing is allowed once every 2 years. The testing frequency can be increased to one year for patients who have rapidly progressing disease, those who are receiving or discontinuing medical therapy to restore bone mass, or have additional risk factors. Electronically Signed   By: Romona Curls M.D.   On: 05/21/2023 15:35   IR Radiologist Eval & Mgmt  Result Date: 05/20/2023 EXAM: NEW PATIENT OFFICE VISIT CHIEF COMPLAINT: Electronic medical record HISTORY OF PRESENT ILLNESS: Electronic  medical record REVIEW OF SYSTEMS: Electronic medical record PHYSICAL EXAMINATION: Electronic medical record ASSESSMENT AND PLAN: Electronic medical record Electronically Signed   By: Gilmer Mor D.O.   On: 05/20/2023 10:09    Labs:  CBC: Recent Labs    10/27/22 1028 05/20/23 0828  WBC 6.6 7.2  HGB 14.3 15.5  HCT 41.7 48.0*  PLT 370 362  COAGS: Recent Labs    05/20/23 0828  INR 0.9    BMP: Recent Labs    10/27/22 1028 05/20/23 0828  NA 141 144  K 4.4 3.4*  CL 106 99  CO2 20 20  GLUCOSE 115* CANCELED  BUN 20 19  CALCIUM 9.1 9.2  CREATININE 0.93 1.04    LIVER FUNCTION TESTS: Recent Labs    10/27/22 1028 05/20/23 0828  BILITOT 0.3 0.5  AST 22 23  ALT 29 21  ALKPHOS 94  --   PROT 6.6 7.0  ALBUMIN 4.4  --     TUMOR MARKERS: No results for input(s): "AFPTM", "CEA", "CA199", "CHROMGRNA" in the last 8760 hours.  Assessment and Plan:  Deanna Facchini is a very pleasant 62 yo female SP treatment of L1 compression fracture with vertebral augmentation/kyphoplasty 05/28/23, which is now about 11 days ago.    Her pain is improved though not completely resolved, and she has some concerns about sitting long hours while at work.    I did let her know that there may still be some residual inflammation as it has been only 11 days, and hopefully there is some more improvement.  However, it is unusual for [place] to see complete resolution of the pain, as we talked at consult. She understands.   I also let her know that she is free to participate in all of her weight bearing activities at this point, including physical therapy.    L1 compression fracture, acute, SP kyphoplasty (acute uncomplicated injury) - continue conservative measure for further relief such as OTC's as bottle prescribes, ice and heat as needed - PT is ok to participate - hopefully there is further healing that will result in additional pain relief     Electronically Signed: Gilmer Mor 06/08/2023, 4:25  PM   I spent a total of    15 Minutes in remote  clinical consultation, with review of previous records, imaging, labs, communication with the patient, documentation, greater than 50% of which was counseling/coordinating care for L1 compression fracture, status post kyphoplasty.    Visit type: Audio only (telephone). Audio (no video) only due to patient's lack of internet/smartphone capability. Alternative for in-person consultation at [facility], [address] [place], [place], [place]. This visit type was conducted due to national recommendations for restrictions regarding the COVID-19 Pandemic (e.g. social distancing).  This format is felt to be most appropriate for this patient at this time.  All issues noted in this document were discussed and addressed.

## 2023-06-12 ENCOUNTER — Other Ambulatory Visit: Payer: Self-pay | Admitting: Interventional Radiology

## 2023-06-12 DIAGNOSIS — D219 Benign neoplasm of connective and other soft tissue, unspecified: Secondary | ICD-10-CM

## 2023-06-16 ENCOUNTER — Other Ambulatory Visit: Payer: 59

## 2023-06-17 ENCOUNTER — Other Ambulatory Visit: Payer: Self-pay | Admitting: Specialist

## 2023-06-17 ENCOUNTER — Other Ambulatory Visit: Payer: Self-pay | Admitting: Interventional Radiology

## 2023-06-17 DIAGNOSIS — M5451 Vertebrogenic low back pain: Secondary | ICD-10-CM

## 2023-06-19 ENCOUNTER — Other Ambulatory Visit: Payer: Self-pay | Admitting: Nurse Practitioner

## 2023-06-19 DIAGNOSIS — F320 Major depressive disorder, single episode, mild: Secondary | ICD-10-CM

## 2023-06-24 ENCOUNTER — Ambulatory Visit
Admission: RE | Admit: 2023-06-24 | Discharge: 2023-06-24 | Disposition: A | Discharge status: Home / Self Care | Payer: 59 | Source: Ambulatory Visit | Attending: Interventional Radiology

## 2023-06-24 DIAGNOSIS — M5451 Vertebrogenic low back pain: Secondary | ICD-10-CM

## 2023-07-03 ENCOUNTER — Ambulatory Visit
Admission: RE | Admit: 2023-07-03 | Discharge: 2023-07-03 | Disposition: A | Discharge status: Home / Self Care | Payer: 59 | Source: Ambulatory Visit | Attending: Specialist | Admitting: Specialist

## 2023-07-03 DIAGNOSIS — M5451 Vertebrogenic low back pain: Secondary | ICD-10-CM

## 2023-07-03 NOTE — Progress Notes (Signed)
Patient ID: Deanna [name], female   DOB: [birth year], 61 y.o.   MRN: [id number]       Chief Complaint: Patient was seen in consultation today for pain post kyphoplasty at the request of Beane,Jeffrey  Referring Physician(s): Beane,Jeffrey  History of Present Illness: Deanna [name] is a 62 y.o. female with no previous diagnosis of osteopenia who  02/27/23 fell down some stairs at home with severe pain 02/28/23 CT in ED showed L1 acute compression fracture. Tried brace and ibuprofen for  2 weeks with no significant relief. Pain 5-8/10. Can't tolerate vicodin due to nausea despite zofran. 04/19/23 MR shows persistent unhealed L1 compression deformity 05/28/23 underwent uncomplicated L1 kyphoplasty 06/08/23 on followup some relief with persisent pain 5/10 on ibuprofen 06/24/23 T- and L-spine radiographs show stable changes L1 post KP, no other acute findings.  Her low back and R hip/thigh pain persists from 3/10 at rest to 8/10 with a couple hours of routine work around [employer] etc despite use of lumbar brace, and ibuprofen. No new bowel or bladder control symptoms.  Past Medical History:  Diagnosis Date   Cancer (HCC)    breast   Diverticulosis of sigmoid colon 07/14/2011   GERD (gastroesophageal reflux disease)    Migraine    PONV (postoperative nausea and vomiting)    Tubular adenoma of colon     Past Surgical History:  Procedure Laterality Date   ABDOMINAL HYSTERECTOMY  02/2010   partial   BREAST REDUCTION SURGERY  02/2011   BREAST SURGERY     cancer   CHOLECYSTECTOMY  07/17/11   w/IOC   COLONOSCOPY     IR KYPHO LUMBAR INC FX REDUCE BONE BX UNI/BIL CANNULATION INC/IMAGING  05/28/2023   IR RADIOLOGIST EVAL & MGMT  05/20/2023   IR RADIOLOGIST EVAL & MGMT  06/08/2023   NASAL SINUS SURGERY  02/2011   TONSILLECTOMY  age 65    Allergies: Iodinated contrast media, Iodine, Iohexol, and Clindamycin/lincomycin  Medications: Prior to Admission medications    Medication Sig Start Date End Date Taking? Authorizing Provider  ALPRAZolam Prudy Feeler) 1 MG tablet Take 1 tablet (1 mg total) by mouth 2 (two) times daily. 01/30/23   Daphine Deutscher, Mary-Margaret, FNP  amLODipine (NORVASC) 5 MG tablet TAKE ONE TABLET BY MOUTH DAILY. 05/14/23   Daphine Deutscher Mary-Margaret, FNP  buPROPion (WELLBUTRIN XL) 150 MG 24 hr tablet TAKE THREE TABLETS ONCE DAILY 06/19/23   Daphine Deutscher, Mary-Margaret, FNP  CALCIUM-VITAMIN D PO Take 1 tablet by mouth. 600 mg of calcium Patient not taking: Reported on 01/30/2023    [provider]  fluticasone (FLONASE) 50 MCG/ACT nasal spray Place 2 sprays into both nostrils daily. 09/06/19   Remus Loffler, PA-C  fluticasone-salmeterol (ADVAIR DISKUS) 250-50 MCG/ACT AEPB Inhale 1 puff into the lungs in the morning and at bedtime. 01/30/23   Daphine Deutscher, Mary-Margaret, FNP  furosemide (LASIX) 20 MG tablet TAKE ONE TABLET BY MOUTH DAILY. 05/14/23   Daphine Deutscher Mary-Margaret, FNP  HYDROcodone-acetaminophen (NORCO/VICODIN) 5-325 MG tablet Take 1-2 tablets by mouth every 6 (six) hours as needed for moderate pain. Patient not taking: Reported on 05/20/2023 02/28/23   Vanetta Mulders, MD  montelukast (SINGULAIR) 10 MG tablet TAKE ONE TABLET BY MOUTH AT BEDTIME. 05/14/23   Daphine Deutscher, Mary-Margaret, FNP  Multiple Vitamin (MULTIVITAMIN) tablet Take 1 tablet by mouth daily.    [provider]  olmesartan (BENICAR) 40 MG tablet TAKE ONE TABLET ONCE DAILY IN THE MORNING 05/14/23   Bennie Pierini, FNP  omeprazole (PRILOSEC) 40  MG capsule TAKE ONE CAPSULE BY MOUTH DAILY. (dr note: see doctor before next refill) 05/12/23   Bennie Pierini, FNP  SUMAtriptan (IMITREX) 100 MG tablet TAKE 1 TABLET ONCE DAILY AS NEEDED FOR MIGRAINE AS DIRECTED BY PHYSICIAN. 01/30/23   Daphine Deutscher, Mary-Margaret, FNP  valACYclovir (VALTREX) 1000 MG tablet TAKE  (1)  TABLET  EVERY TWELVE HOURS. Patient not taking: Reported on 05/20/2023 03/18/19   Remus Loffler, PA-C  VENTOLIN HFA 108 (90 Base)  MCG/ACT inhaler Inhale 2 puffs into the lungs every 6 (six) hours as needed for wheezing or shortness of breath. 06/20/19   Remus Loffler, PA-C     Family History  Problem Relation Age of Onset   Mental illness Mother    Stroke Mother        died with this at age 70   Heart disease Mother    Hyperlipidemia Mother    Alcohol abuse Father    Cancer Paternal Grandmother        colon   Colon cancer Paternal Grandmother    Alcohol abuse Brother    Asthma Brother    Hypertension Brother    Esophageal cancer Neg Hx    Rectal cancer Neg Hx    Stomach cancer Neg Hx     Social History   Socioeconomic History   Marital status: Divorced    Spouse name: Not on file   Number of children: 1   Years of education: Not on file   Highest education level: Associate degree: occupational, Scientist, product/process development, or vocational program  Occupational History   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[employer], [employer]  Tobacco Use   Smoking status: Former    Current packs/day: 0.00    Average packs/day: 0.3 packs/day for 2.0 years (0.5 ttl pk-yrs)    Types: Cigarettes    Start date: 07/28/1998    Quit date: 07/28/2000    Years since quitting: 22.9   Smokeless tobacco: Never  Vaping Use   Vaping status: Never Used  Substance and Sexual Activity   Alcohol use: No   Drug use: No   Sexual activity: Not Currently  Other Topics Concern   Not on file  Social History Narrative   Not on file   Social Determinants of Health   Financial Resource Strain: Patient Declined (01/26/2023)   Overall Financial Resource Strain (CARDIA)    Difficulty of Paying Living Expenses: Patient declined  Food Insecurity: No Food Insecurity (01/26/2023)   Hunger Vital Sign    Worried About Running Out of Food in the Last Year: Never true    Ran Out of Food in the Last Year: Never true  Transportation Needs: No Transportation Needs (01/26/2023)   PRAPARE - Administrator, Civil Service (Medical): No    Lack of Transportation  (Non-Medical): No  Physical Activity: Unknown (01/26/2023)   Exercise Vital Sign    Days of Exercise per Week: 0 days    Minutes of Exercise per Session: Not on file  Stress: Stress Concern Present (01/26/2023)   [employer] of Occupational Health - Occupational Stress Questionnaire    Feeling of Stress : Rather much  Social Connections: Unknown (01/26/2023)   Social Connection and Isolation Panel [NHANES]    Frequency of Communication with Friends and Family: More than three times a week    Frequency of Social Gatherings with Friends and Family: Twice a week    Attends Religious Services: Patient declined    Database administrator or Organizations: No  Attends Banker Meetings: Not on file    Marital Status: Separated    ECOG Status: 2 - Symptomatic, <50% confined to bed  Review of Systems: A 12 point ROS discussed and pertinent positives are indicated in the HPI above.  All other systems are negative.  Review of Systems  Vital Signs: BP (!) 175/88   Pulse 80   Temp 97.8 F (36.6 C)   Resp 16   SpO2 94%     Physical Exam Constitutional: Oriented to person, place, and time. Well-developed and well-nourished. No distress.  Walks with antalgic gate, with cane. HENT:  Head: Normocephalic and atraumatic.  Eyes: Conjunctivae and EOM are normal. Right eye exhibits no discharge. Left eye exhibits no discharge. No scleral icterus.  Neck: No JVD present.  Pulmonary/Chest: Effort normal. No stridor. No respiratory distress.  Abdomen: soft, non distended Neurological:  alert and oriented to person, place, and time.  Skin: Skin is warm and dry.  not diaphoretic.  Psychiatric:   normal mood and affect.   behavior is normal. Judgment and thought content normal.       Imaging: EXAM: THORACIC SPINE - 3 VIEWS   COMPARISON:  11/28/2019   FINDINGS: There is no evidence of thoracic spine fracture. Alignment is normal. Mild spondylitic changes in the visualized  lower cervical spine. Post kyphoplasty L1. No other significant bone abnormalities are identified.   IMPRESSION: 1. No acute findings. 2. Post kyphoplasty L1.  EXAM: LUMBAR SPINE - COMPLETE 4+ VIEW   COMPARISON:  05/28/2023   FINDINGS: Stable L1 compression fracture deformity post kyphoplasty with a small amount of intradiscal cement extension into the T12-L1 interspace. No progressive loss of height. No new fracture. Minimal anterior endplate spurring in the lumbar spine. Facet DJD L4-S1 right greater than left. Scattered aortic calcifications. Cholecystectomy clips.   IMPRESSION: 1. Stable L1 compression fracture post kyphoplasty. No new fracture. 2. Lower lumbar facet DJD.      Labs:  CBC: Recent Labs    10/27/22 1028 05/20/23 0828  WBC 6.6 7.2  HGB 14.3 15.5  HCT 41.7 48.0*  PLT 370 362    COAGS: Recent Labs    05/20/23 0828  INR 0.9    BMP: Recent Labs    10/27/22 1028 05/20/23 0828  NA 141 144  K 4.4 3.4*  CL 106 99  CO2 20 20  GLUCOSE 115* CANCELED  BUN 20 19  CALCIUM 9.1 9.2  CREATININE 0.93 1.04    LIVER FUNCTION TESTS: Recent Labs    10/27/22 1028 05/20/23 0828  BILITOT 0.3 0.5  AST 22 23  ALT 29 21  ALKPHOS 94  --   PROT 6.6 7.0  ALBUMIN 4.4  --     TUMOR MARKERS: No results for input(s): "AFPTM", "CEA", "CA199", "CHROMGRNA" in the last 8760 hours.  Assessment and Plan:  Persistent pain post kyphoplasty, without apparent complication. Xrays look good, but adjacent level fractures can be very subtle. Concern increased with intradiscal cement. RLE pain may be due to nerve root irritation at the KP level or lower where we see facet DJD. We will get  CT T/L spine without contrast for complete evaluation, to r/o new fracture, surgical complication, and foramenal stenosis.  Follow up with pt once results back.  Thank you for this interesting consult.  I greatly enjoyed meeting Deanna [name] and look forward to participating  in their care.  A copy of this report was sent to the requesting provider on this date.  Electronically Signed: Durwin Glaze 07/03/2023, 9:41 AM   I spent a total of    25 Minutes in face to face in clinical consultation, greater than 50% of which was counseling/coordinating care for pain post lumbar kyphoplasty.

## 2023-07-06 ENCOUNTER — Other Ambulatory Visit: Payer: Self-pay | Admitting: Interventional Radiology

## 2023-07-06 ENCOUNTER — Encounter: Payer: Self-pay | Admitting: Specialist

## 2023-07-06 DIAGNOSIS — M5451 Vertebrogenic low back pain: Secondary | ICD-10-CM

## 2023-07-07 ENCOUNTER — Inpatient Hospital Stay
Admission: RE | Admit: 2023-07-07 | Discharge: 2023-07-07 | Discharge status: Home / Self Care | Payer: 59 | Source: Ambulatory Visit | Attending: Interventional Radiology | Admitting: Interventional Radiology

## 2023-07-07 DIAGNOSIS — M5451 Vertebrogenic low back pain: Secondary | ICD-10-CM

## 2023-07-08 ENCOUNTER — Other Ambulatory Visit (HOSPITAL_COMMUNITY): Payer: Self-pay | Admitting: Interventional Radiology

## 2023-07-08 DIAGNOSIS — M5459 Other low back pain: Secondary | ICD-10-CM

## 2023-07-08 NOTE — Progress Notes (Addendum)
Patient ID: Deanna [name], female   DOB: [birth year], 62 y.o.   MRN: [id number]  I reviewed the T and L spine CT and spoke with patient over the phone. No fracture or unexpected complication of L1 KP. 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  She would like to  try  directed epidural steroid injection to this region for relief of symptoms. I'll pass her info to schedulers at [facility] to set this up as an outpatient.  DDH 07/08/23 1107am

## 2023-07-14 ENCOUNTER — Other Ambulatory Visit: Payer: Self-pay | Admitting: Student

## 2023-07-14 ENCOUNTER — Telehealth: Payer: Self-pay | Admitting: Student

## 2023-07-14 DIAGNOSIS — M549 Dorsalgia, unspecified: Secondary | ICD-10-CM

## 2023-07-14 MED ORDER — PREDNISONE 50 MG PO TABS: ORAL_TABLET | ORAL | 0 refills | Status: DC

## 2023-07-14 MED ORDER — DIPHENHYDRAMINE HCL 50 MG PO CAPS: ORAL_CAPSULE | ORAL | 0 refills | Status: DC

## 2023-07-14 NOTE — Telephone Encounter (Signed)
Contrast allergy premedication sent to [facility].    Loyce Dys, MS RD PA-C

## 2023-07-20 ENCOUNTER — Ambulatory Visit (HOSPITAL_COMMUNITY)
Admission: RE | Admit: 2023-07-20 | Discharge: 2023-07-20 | Disposition: A | Discharge status: Home / Self Care | Payer: 59 | Source: Ambulatory Visit | Attending: Interventional Radiology | Admitting: Interventional Radiology

## 2023-07-20 DIAGNOSIS — M5459 Other low back pain: Secondary | ICD-10-CM | POA: Insufficient documentation

## 2023-07-20 DIAGNOSIS — M5410 Radiculopathy, site unspecified: Secondary | ICD-10-CM | POA: Insufficient documentation

## 2023-07-20 HISTORY — PX: IR INJECT/THERA/INC NEEDLE/CATH/PLC EPI/LUMB/SAC W/IMG: IMG6130

## 2023-07-20 MED ORDER — LIDOCAINE HCL (PF) 1 % IJ SOLN
INTRAMUSCULAR | Status: AC
  Filled 2023-07-20: qty 30

## 2023-07-20 MED ORDER — IOHEXOL 180 MG/ML  SOLN
10.0000 mL | Freq: Once | INTRAMUSCULAR | Status: AC | PRN
  Administered 2023-07-20: 1 mL via INTRAVENOUS

## 2023-07-20 MED ORDER — METHYLPREDNISOLONE ACETATE 80 MG/ML IJ SUSP
80.0000 mg | Freq: Once | INTRAMUSCULAR | Status: AC
  Administered 2023-07-20: 80 mg via INTRA_ARTICULAR

## 2023-07-20 MED ORDER — SODIUM CHLORIDE (PF) 0.9 % IJ SOLN
INTRAMUSCULAR | Status: AC
  Filled 2023-07-20: qty 10

## 2023-07-20 MED ORDER — STERILE WATER FOR INJECTION IJ SOLN
INTRAMUSCULAR | Status: AC
  Filled 2023-07-20: qty 10

## 2023-07-20 MED ORDER — METHYLPREDNISOLONE ACETATE 80 MG/ML IJ SUSP
INTRAMUSCULAR | Status: AC
  Filled 2023-07-20: qty 1

## 2023-07-20 MED ORDER — IOHEXOL 180 MG/ML  SOLN
INTRAMUSCULAR | Status: AC
  Filled 2023-07-20: qty 10

## 2023-07-20 MED ORDER — LIDOCAINE HCL (PF) 1 % IJ SOLN
30.0000 mL | Freq: Once | INTRAMUSCULAR | Status: AC
  Administered 2023-07-20: 3 mL

## 2023-07-20 MED ORDER — METHYLPREDNISOLONE ACETATE 40 MG/ML IJ SUSP
INTRAMUSCULAR | Status: AC
  Filled 2023-07-20: qty 2

## 2023-07-20 NOTE — Procedures (Signed)
  Procedure:  Lumber ESI right L4-L5    Preprocedure diagnosis: The encounter diagnosis was Other low back pain. Postprocedure diagnosis: same EBL:    minimal Complications:   none immediate  See full dictation in [employer].  Thora Lance MD Main # [phone] Pager  [phone] Mobile [phone]

## 2023-07-30 ENCOUNTER — Other Ambulatory Visit: Payer: Self-pay | Admitting: Nurse Practitioner

## 2023-07-30 DIAGNOSIS — F320 Major depressive disorder, single episode, mild: Secondary | ICD-10-CM

## 2023-07-30 DIAGNOSIS — F411 Generalized anxiety disorder: Secondary | ICD-10-CM

## 2023-07-31 ENCOUNTER — Other Ambulatory Visit: Payer: Self-pay | Admitting: Nurse Practitioner

## 2023-07-31 DIAGNOSIS — F411 Generalized anxiety disorder: Secondary | ICD-10-CM

## 2023-08-04 ENCOUNTER — Other Ambulatory Visit: Payer: Self-pay | Admitting: Nurse Practitioner

## 2023-08-04 DIAGNOSIS — F411 Generalized anxiety disorder: Secondary | ICD-10-CM

## 2023-08-04 MED ORDER — ALPRAZOLAM 1 MG PO TABS: 1.0000 mg | ORAL_TABLET | Freq: Two times a day (BID) | ORAL | 5 refills | Status: DC

## 2023-08-04 NOTE — Telephone Encounter (Unsigned)
 Copied from CRM [id number]. Topic: Clinical - Medication Refill >> Aug 04, 2023  3:02 PM Bascom RAMAN wrote: Most Recent Primary Care Visit:  Provider: GLADIS MUSTARD  Department: [facility]  Visit Type: OFFICE VISIT  Date: 01/30/2023  Medication: ***  Has the patient contacted their pharmacy? {yes/no:20286} (Agent: If no, request that the patient contact the pharmacy for the refill. If patient does not wish to contact the pharmacy document the reason why and proceed with request.) (Agent: If yes, when and what did the pharmacy advise?)  Is this the correct pharmacy for this prescription? {yes/no:20286} If no, delete pharmacy and type the correct one.  This is the patient's preferred pharmacy:  [facility] [place], [place] - [address] [place] [address] Phone: [phone] Fax: [phone]  [facility] [address] [place] - [address] [place] [address] [place] [address] [place] [address] Phone: [phone] Fax: [phone]   Has the prescription been filled recently? {yes/no:20286}  Is the patient out of the medication? {yes/no:20286}  Has the patient been seen for an appointment in the last year OR does the patient have an upcoming appointment? {yes/no:20286}  Can we respond through MyChart? {yes/no:20286}  Agent: Please be advised that Rx refills may take up to 3 business days. We ask that you follow-up with your pharmacy.

## 2023-08-06 ENCOUNTER — Ambulatory Visit: Payer: 59 | Admitting: Nurse Practitioner

## 2023-08-11 ENCOUNTER — Ambulatory Visit: Payer: 59 | Admitting: Nurse Practitioner

## 2023-08-11 NOTE — Progress Notes (Deleted)
 Subjective:    Patient ID: Deanna [name], female    DOB: 1961-05-08, 63 y.o.   MRN: [id number]   Chief Complaint: medical management of chronic issues     HPI:  CHARLETTE [name] is a 63 y.o. who identifies as a female who was assigned female at birth.   Social history: Lives with: husband Work history: [employer] and [employer]   Comes in today for follow up of the following chronic medical issues:  1. ESSENTIAL HYPERTENSION, BENIGN No c/o chest pain, sob or headache. Doe snot check blood pressure at home. BP Readings from Last 3 Encounters:  07/03/23 (!) 175/88  05/28/23 136/62  05/20/23 (!) 163/94     2. Other migraine without status migrainosus, not intractable Has couple of times a month. Imitrex  works well to relieve symptoms.  3. Mild persistent asthma without complication Uses her advair  daily. Only usees albuterol  a couple of times a month.  4. Gastroesophageal reflux disease without esophagitis Omeprazole  is working well for her.  5. Diverticulosis of sigmoid colon No recent flare ups  6. Depression, major, single episode, mild (HCC) Is on wellbutrin  daily and is doing well. ***   7. GAD (generalized anxiety disorder) Xanax  BID  ***   8. Attention deficit hyperactivity disorder (ADHD), predominantly inattentive type Is on no prescription ADHD meds  9. BMI 40.0-44.9, adult (HCC) No recent weight changes Wt Readings from Last 3 Encounters:  02/28/23 245 lb (111.1 kg)  01/30/23 254 lb (115.2 kg)  10/27/22 251 lb (113.9 kg)   BMI Readings from Last 3 Encounters:  02/28/23 42.05 kg/m  01/30/23 43.60 kg/m  10/27/22 43.08 kg/m     New complaints: None today  Allergies  Allergen Reactions   Iodinated Contrast Media Rash    RESPIRATORY ARREST    Iodine Hives, Other (See Comments) and Swelling   Iohexol  Anaphylaxis    IBD dye   Clindamycin /Lincomycin Nausea And Vomiting   Outpatient Encounter Medications as of 08/11/2023  Medication Sig    ALPRAZolam  (XANAX ) 1 MG tablet Take 1 tablet (1 mg total) by mouth 2 (two) times daily.   amLODipine  (NORVASC ) 5 MG tablet TAKE ONE TABLET BY MOUTH DAILY.   buPROPion  (WELLBUTRIN  XL) 150 MG 24 hr tablet TAKE THREE TABLETS ONCE DAILY   CALCIUM-VITAMIN D PO Take 1 tablet by mouth. 600 mg of calcium (Patient not taking: Reported on 01/30/2023)   diphenhydrAMINE  (BENADRYL ) 50 MG capsule Take one capsule 1 hour prior to scan.   fluticasone  (FLONASE ) 50 MCG/ACT nasal spray Place 2 sprays into both nostrils daily.   fluticasone -salmeterol (ADVAIR  DISKUS) 250-50 MCG/ACT AEPB Inhale 1 puff into the lungs in the morning and at bedtime.   furosemide  (LASIX ) 20 MG tablet TAKE ONE TABLET BY MOUTH DAILY.   HYDROcodone -acetaminophen  (NORCO/VICODIN) 5-325 MG tablet Take 1-2 tablets by mouth every 6 (six) hours as needed for moderate pain. (Patient not taking: Reported on 05/20/2023)   montelukast  (SINGULAIR ) 10 MG tablet TAKE ONE TABLET BY MOUTH AT BEDTIME.   Multiple Vitamin (MULTIVITAMIN) tablet Take 1 tablet by mouth daily.   olmesartan  (BENICAR ) 40 MG tablet TAKE ONE TABLET ONCE DAILY IN THE MORNING   omeprazole  (PRILOSEC) 40 MG capsule TAKE ONE CAPSULE BY MOUTH DAILY. (dr note: see doctor before next refill)   predniSONE  (DELTASONE ) 50 MG tablet Take one tablet 13 hours, 7 hours, and 1 hour prior to scan.   SUMAtriptan  (IMITREX ) 100 MG tablet TAKE 1 TABLET ONCE DAILY AS NEEDED FOR MIGRAINE AS DIRECTED  BY PHYSICIAN.   valACYclovir  (VALTREX ) 1000 MG tablet TAKE  (1)  TABLET  EVERY TWELVE HOURS. (Patient not taking: Reported on 05/20/2023)   VENTOLIN  HFA 108 (90 Base) MCG/ACT inhaler Inhale 2 puffs into the lungs every 6 (six) hours as needed for wheezing or shortness of breath.   No facility-administered encounter medications on file as of 08/11/2023.    Past Surgical History:  Procedure Laterality Date   ABDOMINAL HYSTERECTOMY  02/2010   partial   BREAST REDUCTION SURGERY  02/2011   BREAST SURGERY      cancer   CHOLECYSTECTOMY  07/17/11   w/IOC   COLONOSCOPY     IR INJECT/THERA/INC NEEDLE/CATH/PLC EPI/LUMB/SAC W/IMG  07/20/2023   IR KYPHO LUMBAR INC FX REDUCE BONE BX UNI/BIL CANNULATION INC/IMAGING  05/28/2023   IR RADIOLOGIST EVAL & MGMT  05/20/2023   IR RADIOLOGIST EVAL & MGMT  06/08/2023   NASAL SINUS SURGERY  02/2011   TONSILLECTOMY  age 67    Family History  Problem Relation Age of Onset   Mental illness Mother    Stroke Mother        died with this at age 66   Heart disease Mother    Hyperlipidemia Mother    Alcohol abuse Father    Cancer Paternal Grandmother        colon   Colon cancer Paternal Grandmother    Alcohol abuse Brother    Asthma Brother    Hypertension Brother    Esophageal cancer Neg Hx    Rectal cancer Neg Hx    Stomach cancer Neg Hx       Controlled substance contract: n/a     Review of Systems  Constitutional:  Negative for diaphoresis.  Eyes:  Negative for pain.  Respiratory:  Negative for shortness of breath.   Cardiovascular:  Negative for chest pain, palpitations and leg swelling.  Gastrointestinal:  Negative for abdominal pain.  Endocrine: Negative for polydipsia.  Skin:  Negative for rash.  Neurological:  Negative for dizziness, weakness and headaches.  Hematological:  Does not bruise/bleed easily.  All other systems reviewed and are negative.      Objective:   Physical Exam Vitals and nursing note reviewed.  Constitutional:      General: She is not in acute distress.    Appearance: Normal appearance. She is well-developed.  HENT:     Head: Normocephalic.     Right Ear: Tympanic membrane normal.     Left Ear: Tympanic membrane normal.     Nose: Nose normal.     Mouth/Throat:     Mouth: Mucous membranes are moist.  Eyes:     Pupils: Pupils are equal, round, and reactive to light.  Neck:     Vascular: No carotid bruit or JVD.  Cardiovascular:     Rate and Rhythm: Normal rate and regular rhythm.     Heart sounds: Normal  heart sounds.  Pulmonary:     Effort: Pulmonary effort is normal. No respiratory distress.     Breath sounds: Normal breath sounds. No wheezing or rales.  Chest:     Chest wall: No tenderness.  Abdominal:     General: Bowel sounds are normal. There is no distension or abdominal bruit.     Palpations: Abdomen is soft. There is no hepatomegaly, splenomegaly, mass or pulsatile mass.     Tenderness: There is no abdominal tenderness.  Musculoskeletal:        General: Normal range of motion.  Cervical back: Normal range of motion and neck supple.  Lymphadenopathy:     Cervical: No cervical adenopathy.  Skin:    General: Skin is warm and dry.  Neurological:     Mental Status: She is alert and oriented to person, place, and time.     Deep Tendon Reflexes: Reflexes are normal and symmetric.  Psychiatric:        Behavior: Behavior normal.        Thought Content: Thought content normal.        Judgment: Judgment normal.    There were no vitals taken for this visit.        Assessment & Plan:  ANJANAE WOEHRLE comes in today with chief complaint of No chief complaint on file.   Diagnosis and orders addressed:  1. ESSENTIAL HYPERTENSION, BENIGN Low sodium diet - Bayer DCA Hb A1c Waived - CBC with Differential/Platelet - CMP14+EGFR - Lipid panel - amLODipine  (NORVASC ) 5 MG tablet; Take 1 tablet (5 mg total) by mouth daily. (NEEDS TO BE SEEN BEFORE NEXT REFILL)  Dispense: 90 tablet; Refill: 1 - furosemide  (LASIX ) 20 MG tablet; Take 1 tablet (20 mg total) by mouth daily. (NEEDS TO BE SEEN BEFORE NEXT REFILL)  Dispense: 90 tablet; Refill: 1 - olmesartan  (BENICAR ) 40 MG tablet; Take 1 tablet (40 mg total) by mouth every morning. (NEEDS TO BE SEEN BEFORE NEXT REFILL)  Dispense: 90 tablet; Refill: 1  2. Other migraine without status migrainosus, not intractable Avoid caffeine - SUMAtriptan  (IMITREX ) 100 MG tablet; TAKE 1 TABLET ONCE DAILY AS NEEDED FOR MIGRAINE AS DIRECTED BY  PHYSICIAN.  Dispense: 30 tablet; Refill: 2  3. Mild persistent asthma without complication - fluticasone -salmeterol (ADVAIR  DISKUS) 250-50 MCG/ACT AEPB; Inhale 1 puff into the lungs in the morning and at bedtime.  Dispense: 180 each; Refill: 1  4. Gastroesophageal reflux disease without esophagitis Avoid spicy foods Do not eat 2 hours prior to bedtime - omeprazole  (PRILOSEC) 40 MG capsule; Take 1 capsule (40 mg total) by mouth daily. (NEEDS TO BE SEEN BEFORE NEXT REFILL)  Dispense: 90 capsule; Refill: 1  5. Diverticulosis of sigmoid colon Watch diet to prevent flare up  6. Depression, major, single episode, mild (HCC) Stress management - buPROPion  (WELLBUTRIN  XL) 150 MG 24 hr tablet; Take 3 tablets (450 mg total) by mouth daily.  Dispense: 90 tablet; Refill: 1  7. GAD (generalized anxiety disorder) - ALPRAZolam  (XANAX ) 1 MG tablet; Take 1 tablet (1 mg total) by mouth 2 (two) times daily.  Dispense: 60 tablet; Refill: 5  8. Attention deficit hyperactivity disorder (ADHD), predominantly inattentive type  9. BMI 40.0-44.9, adult (HCC) Discussed diet and exercise for person with BMI >25 Will recheck weight in 3-6 months  - Thyroid  Panel With TSH   Labs pending Health Maintenance reviewed Diet and exercise encouraged  Follow up plan: 6 months   Mary-Margaret Lunger, FNP

## 2023-08-17 ENCOUNTER — Ambulatory Visit: Payer: 59 | Attending: Orthopedic Surgery

## 2023-08-17 ENCOUNTER — Other Ambulatory Visit: Payer: Self-pay

## 2023-08-17 DIAGNOSIS — M6281 Muscle weakness (generalized): Secondary | ICD-10-CM | POA: Diagnosis present

## 2023-08-17 DIAGNOSIS — M5416 Radiculopathy, lumbar region: Secondary | ICD-10-CM | POA: Diagnosis present

## 2023-08-17 NOTE — Therapy (Signed)
OUTPATIENT PHYSICAL THERAPY THORACOLUMBAR EVALUATION   Patient Name: Deanna [name] MRN: [id number] DOB:1961/03/16, 63 y.o., female Today's Date: 08/17/2023  END OF SESSION:  PT End of Session - 08/17/23 1100     Visit Number 1    Number of Visits 12    Date for PT Re-Evaluation 10/23/23    PT Start Time 1102    PT Stop Time 1153    PT Time Calculation (min) 51 min    Activity Tolerance Patient tolerated treatment well    Behavior During Therapy [facility] for tasks assessed/performed             Past Medical History:  Diagnosis Date   Cancer (HCC)    breast   Diverticulosis of sigmoid colon 07/14/2011   GERD (gastroesophageal reflux disease)    Migraine    PONV (postoperative nausea and vomiting)    Tubular adenoma of colon    Past Surgical History:  Procedure Laterality Date   ABDOMINAL HYSTERECTOMY  02/2010   partial   BREAST REDUCTION SURGERY  02/2011   BREAST SURGERY     cancer   CHOLECYSTECTOMY  07/17/11   w/IOC   COLONOSCOPY     IR INJECT/THERA/INC NEEDLE/CATH/PLC EPI/LUMB/SAC W/IMG  07/20/2023   IR KYPHO LUMBAR INC FX REDUCE BONE BX UNI/BIL CANNULATION INC/IMAGING  05/28/2023   IR RADIOLOGIST EVAL & MGMT  05/20/2023   IR RADIOLOGIST EVAL & MGMT  06/08/2023   NASAL SINUS SURGERY  02/2011   TONSILLECTOMY  age 13   Patient Active Problem List   Diagnosis Date Noted   BMI 40.0-44.9, adult (HCC) 04/24/2021   Food allergy 10/21/2018   Depression, major, single episode, mild (HCC) 03/18/2018   GAD (generalized anxiety disorder) 03/18/2018   Attention deficit hyperactivity disorder (ADHD), predominantly inattentive type 01/20/2018   Migraine 11/12/2017   Colon cancer screening 07/31/2017   Asthma 09/05/2016   GERD (gastroesophageal reflux disease) 07/14/2011   Diverticulosis of sigmoid colon 07/14/2011   ESSENTIAL HYPERTENSION, BENIGN 06/07/2009    PCP: Bennie Pierini, FNP  REFERRING PROVIDER: Dorothy Spark, PA-C   REFERRING DIAG: Vertebrogenic  low back pain   Rationale for Evaluation and Treatment: Rehabilitation  THERAPY DIAG:  Radiculopathy, lumbar region  Muscle weakness (generalized)  ONSET DATE: August 2024  SUBJECTIVE:                                                                                                                                                                                           SUBJECTIVE STATEMENT: Patient reports that she began having back pain after falling while walking down her basement steps in August 2024. She slipped  on some water which resulted in a 50% compression fracture in her back. She is unable to perform as much activity at home due to her pain. Her pain starts in her back and then radiate down to the inside of her right knee. She also has some numbness in her right buttocks. She also starts limping after walking for a few minutes.   PERTINENT HISTORY:  Hypertension, asthma, ADHD, depression, anxiety, and history of cancer  PAIN:  Are you having pain? Yes: NPRS scale: Current: 1/10 Best: 1/10 Worst: 7-8/10 Pain location: low back radiating to the right medial knee Pain description: constant ache and shooting Aggravating factors: prolonged sitting (45 minutes at most), standing (45 minutes at most), cooking, cleaning, and walking Relieving factors: heating pad  PRECAUTIONS: Fall  RED FLAGS: None   WEIGHT BEARING RESTRICTIONS: No  FALLS:  Has patient fallen in last 6 months? Yes. Number of falls 2  LIVING ENVIRONMENT: Lives with: lives alone Lives in: House/apartment Stairs: No Has following equipment at home: None  OCCUPATION: [employer]: primarily sitting, but may lift up to 25 pounds, 9 hour shift, but not working currently and she is undecided if she will return  PLOF: Independent  PATIENT GOALS: return to her exercise program, improved posture, improved lifting mechanics, and be able to go outside and hike or work in her garden    NEXT MD VISIT:  09/03/23  OBJECTIVE:  Note: Objective measures were completed at Evaluation unless otherwise noted.  DIAGNOSTIC FINDINGS:  IMPRESSION: 1. No acute fracture or traumatic listhesis of the thoracic or lumbar spine. 2. Postoperative changes of L1 vertebral augmentation. No new fracture of the lumbar spine. 3. Moderate right neural foraminal narrowing at L3-L4 and L4-L5.  PATIENT SURVEYS:  Modified Oswestry 60% disability   COGNITION: Overall cognitive status: Within functional limits for tasks assessed     SENSATION: Patient reports intermittent numbness in her right hip.   POSTURE: increased lumbar lordosis  PALPATION: TTP: bilateral lumbar paraspinals, right QL, and L2-5 spinous processes  LUMBAR ROM:   AROM eval  Flexion 64  Extension 14; "tightness and pulling"   Right lateral flexion   Left lateral flexion   Right rotation 25% limited; "pulling down to the right knee"   Left rotation 25% limited   (Blank rows = not tested)  LOWER EXTREMITY ROM: WFL for activities assessed  LOWER EXTREMITY MMT:    MMT Right eval Left eval  Hip flexion 3+/5; uncomfortable 4/5  Hip extension    Hip abduction    Hip adduction    Hip internal rotation    Hip external rotation    Knee flexion 4/5; right knee pain  4+/5  Knee extension 4/5 5/5  Ankle dorsiflexion 4-/5 4-/5  Ankle plantarflexion    Ankle inversion    Ankle eversion     (Blank rows = not tested)  GAIT: Assistive device utilized: None Level of assistance: Complete Independence Comments: Decreased gait speed and stride length with minimal right knee flexion in swing phase  TREATMENT DATE:  PATIENT EDUCATION:  Education details: Plan of care, prognosis, healing, anatomy, objective findings, and goals for physical therapy Person educated: Patient Education method:  Explanation Education comprehension: verbalized understanding  HOME EXERCISE PROGRAM:   ASSESSMENT:  CLINICAL IMPRESSION: Patient is a 63 y.o. female who was seen today for physical therapy evaluation and treatment for right lumbar radiculopathy secondary to a fall.  She presented with low pain severity and moderate irritability with palpation along her lumbar spine and surrounding musculature reproducing her familiar symptoms.  Lumbar joint mobility was unable to be accurately assessed due to pain.  Recommend that she continue with skilled physical therapy to address her impairments to return to prior level of function.  OBJECTIVE IMPAIRMENTS: Abnormal gait, decreased activity tolerance, decreased balance, decreased mobility, difficulty walking, decreased ROM, decreased strength, hypomobility, impaired flexibility, impaired sensation, impaired tone, postural dysfunction, and pain.   ACTIVITY LIMITATIONS: carrying, lifting, bending, sitting, standing, and locomotion level  PARTICIPATION LIMITATIONS: meal prep, cleaning, laundry, driving, shopping, community activity, and occupation  PERSONAL FACTORS: Past/current experiences, Time since onset of injury/illness/exacerbation, and 3+ comorbidities: Hypertension, asthma, ADHD, depression, anxiety, and history of cancer  are also affecting patient's functional outcome.   REHAB POTENTIAL: Good  CLINICAL DECISION MAKING: Evolving/moderate complexity  EVALUATION COMPLEXITY: Moderate   GOALS: Goals reviewed with patient? Yes  SHORT TERM GOALS: Target date: 09/07/23  Patient will be independent with her initial HEP. Baseline: Goal status: INITIAL  2.  Patient will be able to complete her daily activities without her familiar symptoms exceeding 6/10. Baseline:  Goal status: INITIAL  3.  Patient will improve her modified Oswestry score to 50% or less for improved perceived function with her daily activities. Baseline:  Goal status:  INITIAL  4.  Patient will improve her right hip flexor strength to at least 4-/5 for improved function with her daily activities. Baseline:  Goal status: INITIAL  LONG TERM GOALS: Target date: 09/28/23  Patient will be independent with her advanced HEP. Baseline:  Goal status: INITIAL  2.  Patient will be able to complete her daily activities without her familiar symptoms exceeding 4/10. Baseline:  Goal status: INITIAL  3.  Patient will be able to demonstrate proper lifting mechanics for a reduced risk of reinjury. Baseline:  Goal status: INITIAL  4.  Patient will improve her modified Oswestry score to 40% or less for improved perceived function with her daily activities. Baseline:  Goal status: INITIAL  5.  Patient will be able to ambulate with minimal to no significant gait deviations. Baseline:  Goal status: INITIAL  PLAN:  PT FREQUENCY: 2x/week  PT DURATION: 6 weeks  PLANNED INTERVENTIONS: 97164- PT Re-evaluation, 97110-Therapeutic exercises, 97530- Therapeutic activity, [id number]- Neuromuscular re-education, 97535- Self Care, 16109- Manual therapy, [id number]- Gait training, 97014- Electrical stimulation (unattended), 97035- Ultrasound, Patient/Family education, Balance training, Taping, Dry Needling, Joint mobilization, Spinal mobilization, Cryotherapy, and Moist heat.  PLAN FOR NEXT SESSION: NuStep, lumbar isometrics, lifting mechanics, postural reeducation, and modalities as needed   Granville Lewis, PT 08/17/2023, 12:46 PM

## 2023-08-18 ENCOUNTER — Encounter: Payer: Self-pay | Admitting: Nurse Practitioner

## 2023-08-18 ENCOUNTER — Ambulatory Visit: Payer: 59 | Admitting: Nurse Practitioner

## 2023-08-18 VITALS — BP 128/87 | HR 82 | Temp 97.9°F | Ht 64.0 in | Wt 260.0 lb

## 2023-08-18 DIAGNOSIS — G43809 Other migraine, not intractable, without status migrainosus: Secondary | ICD-10-CM | POA: Diagnosis not present

## 2023-08-18 DIAGNOSIS — F411 Generalized anxiety disorder: Secondary | ICD-10-CM

## 2023-08-18 DIAGNOSIS — I10 Essential (primary) hypertension: Secondary | ICD-10-CM | POA: Diagnosis not present

## 2023-08-18 DIAGNOSIS — K5730 Diverticulosis of large intestine without perforation or abscess without bleeding: Secondary | ICD-10-CM

## 2023-08-18 DIAGNOSIS — J4530 Mild persistent asthma, uncomplicated: Secondary | ICD-10-CM

## 2023-08-18 DIAGNOSIS — F900 Attention-deficit hyperactivity disorder, predominantly inattentive type: Secondary | ICD-10-CM

## 2023-08-18 DIAGNOSIS — Z6841 Body Mass Index (BMI) 40.0 and over, adult: Secondary | ICD-10-CM

## 2023-08-18 DIAGNOSIS — K219 Gastro-esophageal reflux disease without esophagitis: Secondary | ICD-10-CM

## 2023-08-18 DIAGNOSIS — F320 Major depressive disorder, single episode, mild: Secondary | ICD-10-CM

## 2023-08-18 DIAGNOSIS — R739 Hyperglycemia, unspecified: Secondary | ICD-10-CM

## 2023-08-18 LAB — BAYER DCA HB A1C WAIVED: HB A1C (BAYER DCA - WAIVED): 5.9 % — ABNORMAL HIGH (ref 4.8–5.6)

## 2023-08-18 LAB — LIPID PANEL

## 2023-08-18 MED ORDER — OLMESARTAN MEDOXOMIL 40 MG PO TABS: 40.0000 mg | ORAL_TABLET | Freq: Every day | ORAL | 1 refills | Status: DC

## 2023-08-18 MED ORDER — BUPROPION HCL ER (XL) 150 MG PO TB24: 150.0000 mg | ORAL_TABLET | Freq: Every day | ORAL | 1 refills | Status: DC

## 2023-08-18 MED ORDER — FUROSEMIDE 20 MG PO TABS: 20.0000 mg | ORAL_TABLET | Freq: Every day | ORAL | 0 refills | Status: DC

## 2023-08-18 MED ORDER — AMLODIPINE BESYLATE 5 MG PO TABS: 5.0000 mg | ORAL_TABLET | Freq: Every day | ORAL | 1 refills | Status: DC

## 2023-08-18 MED ORDER — OMEPRAZOLE 40 MG PO CPDR: 40.0000 mg | DELAYED_RELEASE_CAPSULE | Freq: Every day | ORAL | 1 refills | Status: DC

## 2023-08-18 MED ORDER — FLUTICASONE-SALMETEROL 250-50 MCG/ACT IN AEPB: 1.0000 | INHALATION_SPRAY | Freq: Two times a day (BID) | RESPIRATORY_TRACT | 1 refills | Status: DC

## 2023-08-18 NOTE — Patient Instructions (Signed)
Exercising to Stay Healthy To become healthy and stay healthy, it is recommended that you do moderate-intensity and vigorous-intensity exercise. You can tell that you are exercising at a moderate intensity if your heart starts beating faster and you start breathing faster but can still hold a conversation. You can tell that you are exercising at a vigorous intensity if you are breathing much harder and faster and cannot hold a conversation while exercising. How can exercise benefit me? Exercising regularly is important. It has many health benefits, such as: Improving overall fitness, flexibility, and endurance. Increasing bone density. Helping with weight control. Decreasing body fat. Increasing muscle strength and endurance. Reducing stress and tension, anxiety, depression, or anger. Improving overall health. What guidelines should I follow while exercising? Before you start a new exercise program, talk with your health care provider. Do not exercise so much that you hurt yourself, feel dizzy, or get very short of breath. Wear comfortable clothes and wear shoes with good support. Drink plenty of water while you exercise to prevent dehydration or heat stroke. Work out until your breathing and your heartbeat get faster (moderate intensity). How often should I exercise? Choose an activity that you enjoy, and set realistic goals. Your health care provider can help you make an activity plan that is individually designed and works best for you. Exercise regularly as told by your health care provider. This may include: Doing strength training two times a week, such as: Lifting weights. Using resistance bands. Push-ups. Sit-ups. Yoga. Doing a certain intensity of exercise for a given amount of time. Choose from these options: A total of 150 minutes of moderate-intensity exercise every week. A total of 75 minutes of vigorous-intensity exercise every week. A mix of moderate-intensity and  vigorous-intensity exercise every week. Children, pregnant women, people who have not exercised regularly, people who are overweight, and older adults may need to talk with a health care provider about what activities are safe to perform. If you have a medical condition, be sure to talk with your health care provider before you start a new exercise program. What are some exercise ideas? Moderate-intensity exercise ideas include: Walking 1 mile (1.6 km) in about 15 minutes. Biking. Hiking. Golfing. Dancing. Water aerobics. Vigorous-intensity exercise ideas include: Walking 4.5 miles (7.2 km) or more in about 1 hour. Jogging or running 5 miles (8 km) in about 1 hour. Biking 10 miles (16.1 km) or more in about 1 hour. Lap swimming. Roller-skating or in-line skating. Cross-country skiing. Vigorous competitive sports, such as football, basketball, and soccer. Jumping rope. Aerobic dancing. What are some everyday activities that can help me get exercise? Yard work, such as: Pushing a lawn mower. Raking and bagging leaves. Washing your car. Pushing a stroller. Shoveling snow. Gardening. Washing windows or floors. How can I be more active in my day-to-day activities? Use stairs instead of an elevator. Take a walk during your lunch break. If you drive, park your car farther away from your work or school. If you take public transportation, get off one stop early and walk the rest of the way. Stand up or walk around during all of your indoor phone calls. Get up, stretch, and walk around every 30 minutes throughout the day. Enjoy exercise with a friend. Support to continue exercising will help you keep a regular routine of activity. Where to find more information You can find more information about exercising to stay healthy from: [place] Department of Health and Human Services: www.hhs.gov Centers for Disease Control and Prevention (  CDC): www.cdc.gov Summary Exercising regularly is  important. It will improve your overall fitness, flexibility, and endurance. Regular exercise will also improve your overall health. It can help you control your weight, reduce stress, and improve your bone density. Do not exercise so much that you hurt yourself, feel dizzy, or get very short of breath. Before you start a new exercise program, talk with your health care provider. This information is not intended to replace advice given to you by your health care provider. Make sure you discuss any questions you have with your health care provider. Document Revised: 11/09/2020 Document Reviewed: 11/09/2020 Elsevier Patient Education  2024 Elsevier Inc.  

## 2023-08-18 NOTE — Progress Notes (Addendum)
Subjective:    Patient ID: Deanna [name], female    DOB: 1961-03-23, 63 y.o.   MRN: [id number]   Chief Complaint: medical management of chronic issues     HPI:  Deanna [name] is a 63 y.o. who identifies as a female who was assigned female at birth.   Social history: Lives with: husband Work history: [employer]   Comes in today for follow up of the following chronic medical issues:  1. ESSENTIAL HYPERTENSION, BENIGN No c/o chest pain, sob or headache. Doe snot check blood pressure at home. BP Readings from Last 3 Encounters:  07/03/23 (!) 175/88  05/28/23 136/62  05/20/23 (!) 163/94     2. Other migraine without status migrainosus, not intractable Has couple of times a month. Imitrex works well to relieve symptoms.  3. Mild persistent asthma without complication Uses her advair daily. Only usees albuterol a couple of times a month.  4. Gastroesophageal reflux disease without esophagitis Omeprazole is working well for her.  5. Diverticulosis of sigmoid colon No recent flare ups  6. Depression, major, single episode, mild (HCC) Is on wellbutrin daily and is doing well.    08/18/2023   10:43 AM 01/30/2023    8:14 AM 10/27/2022    9:56 AM  Depression screen PHQ 2/9  Decreased Interest 2 2 2   Down, Depressed, Hopeless 2 2 1   PHQ - 2 Score 4 4 3   Altered sleeping 2 2 1   Tired, decreased energy 2 2 1   Change in appetite 2 2 1   Feeling bad or failure about yourself  2 2 1   Trouble concentrating 2 2 1   Moving slowly or fidgety/restless 0 0 0  Suicidal thoughts 0 0 0  PHQ-9 Score 14 14 8   Difficult doing work/chores Not difficult at all Not difficult at all Somewhat difficult      7. GAD (generalized anxiety disorder) Xanx BID     08/18/2023   10:44 AM 01/30/2023    8:14 AM 10/27/2022    9:57 AM 04/07/2022   11:49 AM  GAD 7 : Generalized Anxiety Score  Nervous, Anxious, on Edge 2 2 1 1   Control/stop worrying 0 0 1 1  Worry too much - different  things 2 2 1 1   Trouble relaxing 0 0 0 0  Restless 0 0 0 0  Easily annoyed or irritable 0 0 0 0  Afraid - awful might happen 0 0 0 0  Total GAD 7 Score 4 4 3 3   Anxiety Difficulty Somewhat difficult Somewhat difficult Not difficult at all Not difficult at all       8. Attention deficit hyperactivity disorder (ADHD), predominantly inattentive type Is on no prescription ADHD meds  9. BMI 40.0-44.9, adult (HCC) No recent weight changes  Wt Readings from Last 3 Encounters:  08/18/23 260 lb (117.9 kg)  02/28/23 245 lb (111.1 kg)  01/30/23 254 lb (115.2 kg)   BMI Readings from Last 3 Encounters:  08/18/23 44.63 kg/m  02/28/23 42.05 kg/m  01/30/23 43.60 kg/m       New complaints: None today  Allergies  Allergen Reactions   Iodinated Contrast Media Rash    RESPIRATORY ARREST    Iodine Hives, Other (See Comments) and Swelling   Iohexol Anaphylaxis    IBD dye   Clindamycin/Lincomycin Nausea And Vomiting   Outpatient Encounter Medications as of 08/18/2023  Medication Sig   ALPRAZolam (XANAX) 1 MG tablet Take 1 tablet (1 mg total) by mouth 2 (  two) times daily.   amLODipine (NORVASC) 5 MG tablet TAKE ONE TABLET BY MOUTH DAILY.   buPROPion (WELLBUTRIN XL) 150 MG 24 hr tablet TAKE THREE TABLETS ONCE DAILY   CALCIUM-VITAMIN D PO Take 1 tablet by mouth. 600 mg of calcium (Patient not taking: Reported on 01/30/2023)   diphenhydrAMINE (BENADRYL) 50 MG capsule Take one capsule 1 hour prior to scan.   fluticasone (FLONASE) 50 MCG/ACT nasal spray Place 2 sprays into both nostrils daily.   fluticasone-salmeterol (ADVAIR DISKUS) 250-50 MCG/ACT AEPB Inhale 1 puff into the lungs in the morning and at bedtime.   furosemide (LASIX) 20 MG tablet TAKE ONE TABLET BY MOUTH DAILY.   HYDROcodone-acetaminophen (NORCO/VICODIN) 5-325 MG tablet Take 1-2 tablets by mouth every 6 (six) hours as needed for moderate pain. (Patient not taking: Reported on 05/20/2023)   montelukast (SINGULAIR) 10 MG  tablet TAKE ONE TABLET BY MOUTH AT BEDTIME.   Multiple Vitamin (MULTIVITAMIN) tablet Take 1 tablet by mouth daily.   olmesartan (BENICAR) 40 MG tablet TAKE ONE TABLET ONCE DAILY IN THE MORNING   omeprazole (PRILOSEC) 40 MG capsule TAKE ONE CAPSULE BY MOUTH DAILY. (dr note: see doctor before next refill)   predniSONE (DELTASONE) 50 MG tablet Take one tablet 13 hours, 7 hours, and 1 hour prior to scan.   SUMAtriptan (IMITREX) 100 MG tablet TAKE 1 TABLET ONCE DAILY AS NEEDED FOR MIGRAINE AS DIRECTED BY PHYSICIAN.   valACYclovir (VALTREX) 1000 MG tablet TAKE  (1)  TABLET  EVERY TWELVE HOURS. (Patient not taking: Reported on 05/20/2023)   VENTOLIN HFA 108 (90 Base) MCG/ACT inhaler Inhale 2 puffs into the lungs every 6 (six) hours as needed for wheezing or shortness of breath.   No facility-administered encounter medications on file as of 08/18/2023.    Past Surgical History:  Procedure Laterality Date   ABDOMINAL HYSTERECTOMY  02/2010   partial   BREAST REDUCTION SURGERY  02/2011   BREAST SURGERY     cancer   CHOLECYSTECTOMY  07/17/11   w/IOC   COLONOSCOPY     IR INJECT/THERA/INC NEEDLE/CATH/PLC EPI/LUMB/SAC W/IMG  07/20/2023   IR KYPHO LUMBAR INC FX REDUCE BONE BX UNI/BIL CANNULATION INC/IMAGING  05/28/2023   IR RADIOLOGIST EVAL & MGMT  05/20/2023   IR RADIOLOGIST EVAL & MGMT  06/08/2023   NASAL SINUS SURGERY  02/2011   TONSILLECTOMY  age 89    Family History  Problem Relation Age of Onset   Mental illness Mother    Stroke Mother        died with this at age 13   Heart disease Mother    Hyperlipidemia Mother    Alcohol abuse Father    Cancer Paternal Grandmother        colon   Colon cancer Paternal Grandmother    Alcohol abuse Brother    Asthma Brother    Hypertension Brother    Esophageal cancer Neg Hx    Rectal cancer Neg Hx    Stomach cancer Neg Hx       Controlled substance contract: n/a     Review of Systems  Constitutional:  Negative for diaphoresis.  Eyes:   Negative for pain.  Respiratory:  Negative for shortness of breath.   Cardiovascular:  Negative for chest pain, palpitations and leg swelling.  Gastrointestinal:  Negative for abdominal pain.  Endocrine: Negative for polydipsia.  Skin:  Negative for rash.  Neurological:  Negative for dizziness, weakness and headaches.  Hematological:  Does not bruise/bleed easily.  All other  systems reviewed and are negative.      Objective:   Physical Exam Vitals and nursing note reviewed.  Constitutional:      General: She is not in acute distress.    Appearance: Normal appearance. She is well-developed.  HENT:     Head: Normocephalic.     Right Ear: Tympanic membrane normal.     Left Ear: Tympanic membrane normal.     Nose: Nose normal.     Mouth/Throat:     Mouth: Mucous membranes are moist.  Eyes:     Pupils: Pupils are equal, round, and reactive to light.  Neck:     Vascular: No carotid bruit or JVD.  Cardiovascular:     Rate and Rhythm: Normal rate and regular rhythm.     Heart sounds: Normal heart sounds.  Pulmonary:     Effort: Pulmonary effort is normal. No respiratory distress.     Breath sounds: Normal breath sounds. No wheezing or rales.  Chest:     Chest wall: No tenderness.  Abdominal:     General: Bowel sounds are normal. There is no distension or abdominal bruit.     Palpations: Abdomen is soft. There is no hepatomegaly, splenomegaly, mass or pulsatile mass.     Tenderness: There is no abdominal tenderness.  Musculoskeletal:        General: Normal range of motion.     Cervical back: Normal range of motion and neck supple.  Lymphadenopathy:     Cervical: No cervical adenopathy.  Skin:    General: Skin is warm and dry.  Neurological:     Mental Status: She is alert and oriented to person, place, and time.     Deep Tendon Reflexes: Reflexes are normal and symmetric.  Psychiatric:        Behavior: Behavior normal.        Thought Content: Thought content normal.         Judgment: Judgment normal.    BP 128/87   Pulse 82   Temp 97.9 F (36.6 C) (Temporal)   Ht 5\' 4"  (1.626 m)   Wt 260 lb (117.9 kg)   SpO2 92%   BMI 44.63 kg/m    Hgba1c 5.9%      Assessment & Plan:  Deanna [name] comes in today with chief complaint of medical management of chronic issues    Diagnosis and orders addressed:  1. ESSENTIAL HYPERTENSION, BENIGN Low sodium diet - Bayer DCA Hb A1c Waived - CBC with Differential/Platelet - CMP14+EGFR - Lipid panel - amLODipine (NORVASC) 5 MG tablet; Take 1 tablet (5 mg total) by mouth daily. (NEEDS TO BE SEEN BEFORE NEXT REFILL)  Dispense: 90 tablet; Refill: 1 - furosemide (LASIX) 20 MG tablet; Take 1 tablet (20 mg total) by mouth daily. (NEEDS TO BE SEEN BEFORE NEXT REFILL)  Dispense: 90 tablet; Refill: 1 - olmesartan (BENICAR) 40 MG tablet; Take 1 tablet (40 mg total) by mouth every morning. (NEEDS TO BE SEEN BEFORE NEXT REFILL)  Dispense: 90 tablet; Refill: 1  2. Other migraine without status migrainosus, not intractable Avoid caffeine - SUMAtriptan (IMITREX) 100 MG tablet; TAKE 1 TABLET ONCE DAILY AS NEEDED FOR MIGRAINE AS DIRECTED BY PHYSICIAN.  Dispense: 30 tablet; Refill: 2  3. Mild persistent asthma without complication - fluticasone-salmeterol (ADVAIR DISKUS) 250-50 MCG/ACT AEPB; Inhale 1 puff into the lungs in the morning and at bedtime.  Dispense: 180 each; Refill: 1  4. Gastroesophageal reflux disease without esophagitis Avoid spicy foods Do not eat  2 hours prior to bedtime - omeprazole (PRILOSEC) 40 MG capsule; Take 1 capsule (40 mg total) by mouth daily. (NEEDS TO BE SEEN BEFORE NEXT REFILL)  Dispense: 90 capsule; Refill: 1  5. Diverticulosis of sigmoid colon Watch diet to prevent flare up  6. Depression, major, single episode, mild (HCC) Stress management - buPROPion (WELLBUTRIN XL) 150 MG 24 hr tablet; Take 3 tablets (450 mg total) by mouth daily.  Dispense: 90 tablet; Refill: 1  7. GAD (generalized  anxiety disorder) - ALPRAZolam (XANAX) 1 MG tablet; Take 1 tablet (1 mg total) by mouth 2 (two) times daily.  Dispense: 60 tablet; Refill: 5  8. Attention deficit hyperactivity disorder (ADHD), predominantly inattentive type  9. BMI 40.0-44.9, adult (HCC) Discussed diet and exercise for person with BMI >25 Will recheck weight in 3-6 months  - Thyroid Panel With TSH  10. Prediabetes Watch carbs in diet  Labs pending Health Maintenance reviewed Diet and exercise encouraged  Follow up plan: 6 months   Mary-Margaret Daphine Deutscher, FNP

## 2023-08-19 ENCOUNTER — Ambulatory Visit: Payer: 59

## 2023-08-19 DIAGNOSIS — M6281 Muscle weakness (generalized): Secondary | ICD-10-CM

## 2023-08-19 DIAGNOSIS — M5416 Radiculopathy, lumbar region: Secondary | ICD-10-CM | POA: Diagnosis not present

## 2023-08-19 LAB — CBC WITH DIFFERENTIAL/PLATELET
Basophils Absolute: 0.1 10*3/uL (ref 0.0–0.2)
Basos: 1 %
EOS (ABSOLUTE): 0.2 10*3/uL (ref 0.0–0.4)
Eos: 3 %
Hematocrit: 44.3 % (ref 34.0–46.6)
Hemoglobin: 14.4 g/dL (ref 11.1–15.9)
Immature Grans (Abs): 0 10*3/uL (ref 0.0–0.1)
Immature Granulocytes: 0 %
Lymphocytes Absolute: 2.3 10*3/uL (ref 0.7–3.1)
Lymphs: 36 %
MCH: 29.2 pg (ref 26.6–33.0)
MCHC: 32.5 g/dL (ref 31.5–35.7)
MCV: 90 fL (ref 79–97)
Monocytes Absolute: 0.8 10*3/uL (ref 0.1–0.9)
Monocytes: 12 %
Neutrophils Absolute: 3.1 10*3/uL (ref 1.4–7.0)
Neutrophils: 48 %
Platelets: 361 10*3/uL (ref 150–450)
RBC: 4.93 x10E6/uL (ref 3.77–5.28)
RDW: 12.7 % (ref 11.7–15.4)
WBC: 6.5 10*3/uL (ref 3.4–10.8)

## 2023-08-19 LAB — CMP14+EGFR
ALT: 24 IU/L (ref 0–32)
AST: 21 IU/L (ref 0–40)
Albumin: 4.2 g/dL (ref 3.9–4.9)
Alkaline Phosphatase: 87 IU/L (ref 44–121)
BUN/Creatinine Ratio: 14 (ref 12–28)
BUN: 13 mg/dL (ref 8–27)
Bilirubin Total: 0.3 mg/dL (ref 0.0–1.2)
CO2: 24 mmol/L (ref 20–29)
Calcium: 9 mg/dL (ref 8.7–10.3)
Chloride: 103 mmol/L (ref 96–106)
Creatinine, Ser: 0.93 mg/dL (ref 0.57–1.00)
Globulin, Total: 2.4 g/dL (ref 1.5–4.5)
Glucose: 113 mg/dL — ABNORMAL HIGH (ref 70–99)
Potassium: 4 mmol/L (ref 3.5–5.2)
Sodium: 143 mmol/L (ref 134–144)
Total Protein: 6.6 g/dL (ref 6.0–8.5)
eGFR: 70 mL/min/{1.73_m2} (ref >59)

## 2023-08-19 LAB — LIPID PANEL
Cholesterol, Total: 196 mg/dL (ref 100–199)
HDL: 44 mg/dL (ref >39)
LDL CALC COMMENT:: 4.5 ratio — ABNORMAL HIGH (ref 0.0–4.4)
LDL Chol Calc (NIH): 114 mg/dL — ABNORMAL HIGH (ref 0–99)
Triglycerides: 220 mg/dL — ABNORMAL HIGH (ref 0–149)
VLDL Cholesterol Cal: 38 mg/dL (ref 5–40)

## 2023-08-19 NOTE — Therapy (Signed)
OUTPATIENT PHYSICAL THERAPY THORACOLUMBAR TREATMENT   Patient Name: Deanna [name] MRN: [id number] DOB:16-Dec-1960, 62 y.o., female Today's Date: 08/19/2023  END OF SESSION:  PT End of Session - 08/19/23 0841     Visit Number 2    Number of Visits 12    Date for PT Re-Evaluation 10/23/23    PT Start Time 0839    PT Stop Time 0940    PT Time Calculation (min) 61 min    Activity Tolerance Patient tolerated treatment well    Behavior During Therapy [facility] for tasks assessed/performed             Past Medical History:  Diagnosis Date   Cancer (HCC)    breast   Diverticulosis of sigmoid colon 07/14/2011   GERD (gastroesophageal reflux disease)    Migraine    PONV (postoperative nausea and vomiting)    Tubular adenoma of colon    Past Surgical History:  Procedure Laterality Date   ABDOMINAL HYSTERECTOMY  02/2010   partial   BREAST REDUCTION SURGERY  02/2011   BREAST SURGERY     cancer   CHOLECYSTECTOMY  07/17/11   w/IOC   COLONOSCOPY     IR INJECT/THERA/INC NEEDLE/CATH/PLC EPI/LUMB/SAC W/IMG  07/20/2023   IR KYPHO LUMBAR INC FX REDUCE BONE BX UNI/BIL CANNULATION INC/IMAGING  05/28/2023   IR RADIOLOGIST EVAL & MGMT  05/20/2023   IR RADIOLOGIST EVAL & MGMT  06/08/2023   NASAL SINUS SURGERY  02/2011   TONSILLECTOMY  age 49   Patient Active Problem List   Diagnosis Date Noted   BMI 40.0-44.9, adult (HCC) 04/24/2021   Depression, major, single episode, mild (HCC) 03/18/2018   GAD (generalized anxiety disorder) 03/18/2018   Attention deficit hyperactivity disorder (ADHD), predominantly inattentive type 01/20/2018   Migraine 11/12/2017   Asthma 09/05/2016   GERD (gastroesophageal reflux disease) 07/14/2011   Diverticulosis of sigmoid colon 07/14/2011   ESSENTIAL HYPERTENSION, BENIGN 06/07/2009    PCP: Bennie Pierini, FNP  REFERRING PROVIDER: Dorothy Spark, PA-C   REFERRING DIAG: Vertebrogenic low back pain   Rationale for Evaluation and Treatment:  Rehabilitation  THERAPY DIAG:  Radiculopathy, lumbar region  Muscle weakness (generalized)  ONSET DATE: August 2024  SUBJECTIVE:                                                                                                                                                                                           SUBJECTIVE STATEMENT: Pt denies any "pain", but does report pressure in her mid-lower back.   PERTINENT HISTORY:  Hypertension, asthma, ADHD, depression, anxiety, and history of cancer  PAIN:  Are you having  pain? Yes: NPRS scale: no number given/10 Pain location: low back radiating to the right medial knee Pain description: constant ache and shooting Aggravating factors: prolonged sitting (45 minutes at most), standing (45 minutes at most), cooking, cleaning, and walking Relieving factors: heating pad  PRECAUTIONS: Fall  RED FLAGS: None   WEIGHT BEARING RESTRICTIONS: No  FALLS:  Has patient fallen in last 6 months? Yes. Number of falls 2  LIVING ENVIRONMENT: Lives with: lives alone Lives in: House/apartment Stairs: No Has following equipment at home: None  OCCUPATION: [employer]: primarily sitting, but may lift up to 25 pounds, 9 hour shift, but not working currently and she is undecided if she will return  PLOF: Independent  PATIENT GOALS: return to her exercise program, improved posture, improved lifting mechanics, and be able to go outside and hike or work in her garden    NEXT MD VISIT: 09/03/23  OBJECTIVE:  Note: Objective measures were completed at Evaluation unless otherwise noted.  DIAGNOSTIC FINDINGS:  IMPRESSION: 1. No acute fracture or traumatic listhesis of the thoracic or lumbar spine. 2. Postoperative changes of L1 vertebral augmentation. No new fracture of the lumbar spine. 3. Moderate right neural foraminal narrowing at L3-L4 and L4-L5.  PATIENT SURVEYS:  Modified Oswestry 60% disability   COGNITION: Overall cognitive  status: Within functional limits for tasks assessed     SENSATION: Patient reports intermittent numbness in her right hip.   POSTURE: increased lumbar lordosis  PALPATION: TTP: bilateral lumbar paraspinals, right QL, and L2-5 spinous processes  LUMBAR ROM:   AROM eval  Flexion 64  Extension 14; "tightness and pulling"   Right lateral flexion   Left lateral flexion   Right rotation 25% limited; "pulling down to the right knee"   Left rotation 25% limited   (Blank rows = not tested)  LOWER EXTREMITY ROM: WFL for activities assessed  LOWER EXTREMITY MMT:    MMT Right eval Left eval  Hip flexion 3+/5; uncomfortable 4/5  Hip extension    Hip abduction    Hip adduction    Hip internal rotation    Hip external rotation    Knee flexion 4/5; right knee pain  4+/5  Knee extension 4/5 5/5  Ankle dorsiflexion 4-/5 4-/5  Ankle plantarflexion    Ankle inversion    Ankle eversion     (Blank rows = not tested)  GAIT: Assistive device utilized: None Level of assistance: Complete Independence Comments: Decreased gait speed and stride length with minimal right knee flexion in swing phase  TREATMENT DATE:                                                                                                                                           08/19/23                       EXERCISE LOG  Exercise Repetitions and Resistance Comments  Nustep Lvl 3 x 15 mins   Ball Rollout 3 mins   [employer] 3 mins   SKTC 10 reps bil x 3 sec hold   LTR 10 reps each way x 3 sec hold   Rockerboard 3 mins   Towel Stretch (gastroc) 3 reps bil x 15 sec hold    Blank cell = exercise not performed today   PATIENT EDUCATION:  Education details: Plan of care, prognosis, healing, anatomy, objective findings, and goals for physical therapy Person educated: Patient Education method: Explanation Education comprehension: verbalized understanding  HOME EXERCISE PROGRAM:   ASSESSMENT:  CLINICAL  IMPRESSION: Pt arrives for today's treatment session denying any pain, but does report low back pressure.  Pt introduced to Nustep today for warm up without any pain or discomfort.  Pt also introduced to standing and seated exercises to increase strength, function, and ROM.  Normal responses to estim and MH noted upon removal.  Pt reports mild increase in soreness at completion of today's treatment session.  OBJECTIVE IMPAIRMENTS: Abnormal gait, decreased activity tolerance, decreased balance, decreased mobility, difficulty walking, decreased ROM, decreased strength, hypomobility, impaired flexibility, impaired sensation, impaired tone, postural dysfunction, and pain.   ACTIVITY LIMITATIONS: carrying, lifting, bending, sitting, standing, and locomotion level  PARTICIPATION LIMITATIONS: meal prep, cleaning, laundry, driving, shopping, community activity, and occupation  PERSONAL FACTORS: Past/current experiences, Time since onset of injury/illness/exacerbation, and 3+ comorbidities: Hypertension, asthma, ADHD, depression, anxiety, and history of cancer  are also affecting patient's functional outcome.   REHAB POTENTIAL: Good  CLINICAL DECISION MAKING: Evolving/moderate complexity  EVALUATION COMPLEXITY: Moderate   GOALS: Goals reviewed with patient? Yes  SHORT TERM GOALS: Target date: 09/07/23  Patient will be independent with her initial HEP. Baseline: Goal status: INITIAL  2.  Patient will be able to complete her daily activities without her familiar symptoms exceeding 6/10. Baseline:  Goal status: INITIAL  3.  Patient will improve her modified Oswestry score to 50% or less for improved perceived function with her daily activities. Baseline:  Goal status: INITIAL  4.  Patient will improve her right hip flexor strength to at least 4-/5 for improved function with her daily activities. Baseline:  Goal status: INITIAL  LONG TERM GOALS: Target date: 09/28/23  Patient will be  independent with her advanced HEP. Baseline:  Goal status: INITIAL  2.  Patient will be able to complete her daily activities without her familiar symptoms exceeding 4/10. Baseline:  Goal status: INITIAL  3.  Patient will be able to demonstrate proper lifting mechanics for a reduced risk of reinjury. Baseline:  Goal status: INITIAL  4.  Patient will improve her modified Oswestry score to 40% or less for improved perceived function with her daily activities. Baseline:  Goal status: INITIAL  5.  Patient will be able to ambulate with minimal to no significant gait deviations. Baseline:  Goal status: INITIAL  PLAN:  PT FREQUENCY: 2x/week  PT DURATION: 6 weeks  PLANNED INTERVENTIONS: 97164- PT Re-evaluation, 97110-Therapeutic exercises, 97530- Therapeutic activity, [id number]- Neuromuscular re-education, 97535- Self Care, 16109- Manual therapy, [id number]- Gait training, 97014- Electrical stimulation (unattended), 97035- Ultrasound, Patient/Family education, Balance training, Taping, Dry Needling, Joint mobilization, Spinal mobilization, Cryotherapy, and Moist heat.  PLAN FOR NEXT SESSION: NuStep, lumbar isometrics, lifting mechanics, postural reeducation, and modalities as needed   Newman Pies, PTA 08/19/2023, 9:45 AM

## 2023-08-22 LAB — TOXASSURE SELECT 13 (MW), URINE

## 2023-08-25 ENCOUNTER — Ambulatory Visit: Payer: 59

## 2023-08-25 DIAGNOSIS — M5416 Radiculopathy, lumbar region: Secondary | ICD-10-CM | POA: Diagnosis not present

## 2023-08-25 DIAGNOSIS — M6281 Muscle weakness (generalized): Secondary | ICD-10-CM

## 2023-08-25 NOTE — Therapy (Signed)
OUTPATIENT PHYSICAL THERAPY THORACOLUMBAR TREATMENT   Patient Name: Deanna [name] MRN: [id number] DOB:11-16-1960, 63 y.o., female Today's Date: 08/25/2023  END OF SESSION:  PT End of Session - 08/25/23 0846     Visit Number 3    Number of Visits 12    Date for PT Re-Evaluation 10/23/23    PT Start Time 0846    PT Stop Time 0931    PT Time Calculation (min) 45 min    Activity Tolerance Patient tolerated treatment well    Behavior During Therapy [facility] for tasks assessed/performed              Past Medical History:  Diagnosis Date   Cancer (HCC)    breast   Diverticulosis of sigmoid colon 07/14/2011   GERD (gastroesophageal reflux disease)    Migraine    PONV (postoperative nausea and vomiting)    Tubular adenoma of colon    Past Surgical History:  Procedure Laterality Date   ABDOMINAL HYSTERECTOMY  02/2010   partial   BREAST REDUCTION SURGERY  02/2011   BREAST SURGERY     cancer   CHOLECYSTECTOMY  07/17/11   w/IOC   COLONOSCOPY     IR INJECT/THERA/INC NEEDLE/CATH/PLC EPI/LUMB/SAC W/IMG  07/20/2023   IR KYPHO LUMBAR INC FX REDUCE BONE BX UNI/BIL CANNULATION INC/IMAGING  05/28/2023   IR RADIOLOGIST EVAL & MGMT  05/20/2023   IR RADIOLOGIST EVAL & MGMT  06/08/2023   NASAL SINUS SURGERY  02/2011   TONSILLECTOMY  age 19   Patient Active Problem List   Diagnosis Date Noted   BMI 40.0-44.9, adult (HCC) 04/24/2021   Depression, major, single episode, mild (HCC) 03/18/2018   GAD (generalized anxiety disorder) 03/18/2018   Attention deficit hyperactivity disorder (ADHD), predominantly inattentive type 01/20/2018   Migraine 11/12/2017   Asthma 09/05/2016   GERD (gastroesophageal reflux disease) 07/14/2011   Diverticulosis of sigmoid colon 07/14/2011   ESSENTIAL HYPERTENSION, BENIGN 06/07/2009    PCP: Bennie Pierini, FNP  REFERRING PROVIDER: Dorothy Spark, PA-C   REFERRING DIAG: Vertebrogenic low back pain   Rationale for Evaluation and Treatment:  Rehabilitation  THERAPY DIAG:  Radiculopathy, lumbar region  Muscle weakness (generalized)  ONSET DATE: August 2024  SUBJECTIVE:                                                                                                                                                                                           SUBJECTIVE STATEMENT: Patient reports that she is hurting a little more today than normal as she got more of her Christmas decorations down yesterday. Her pain got up to about 6-7/10 yesterday after  getting her Christmas decorations.   PERTINENT HISTORY:  Hypertension, asthma, ADHD, depression, anxiety, and history of cancer  PAIN:  Are you having pain? Yes: NPRS scale: 1.5-2/10 Pain location: low back radiating to the right medial knee Pain description: constant ache and shooting Aggravating factors: prolonged sitting (45 minutes at most), standing (45 minutes at most), cooking, cleaning, and walking Relieving factors: heating pad  PRECAUTIONS: Fall  RED FLAGS: None   WEIGHT BEARING RESTRICTIONS: No  FALLS:  Has patient fallen in last 6 months? Yes. Number of falls 2  LIVING ENVIRONMENT: Lives with: lives alone Lives in: House/apartment Stairs: No Has following equipment at home: None  OCCUPATION: [employer]: primarily sitting, but may lift up to 25 pounds, 9 hour shift, but not working currently and she is undecided if she will return  PLOF: Independent  PATIENT GOALS: return to her exercise program, improved posture, improved lifting mechanics, and be able to go outside and hike or work in her garden    NEXT MD VISIT: 09/03/23  OBJECTIVE:  Note: Objective measures were completed at Evaluation unless otherwise noted.  DIAGNOSTIC FINDINGS:  IMPRESSION: 1. No acute fracture or traumatic listhesis of the thoracic or lumbar spine. 2. Postoperative changes of L1 vertebral augmentation. No new fracture of the lumbar spine. 3. Moderate right neural  foraminal narrowing at L3-L4 and L4-L5.  PATIENT SURVEYS:  Modified Oswestry 60% disability   COGNITION: Overall cognitive status: Within functional limits for tasks assessed     SENSATION: Patient reports intermittent numbness in her right hip.   POSTURE: increased lumbar lordosis  PALPATION: TTP: bilateral lumbar paraspinals, right QL, and L2-5 spinous processes  LUMBAR ROM:   AROM eval  Flexion 64  Extension 14; "tightness and pulling"   Right lateral flexion   Left lateral flexion   Right rotation 25% limited; "pulling down to the right knee"   Left rotation 25% limited   (Blank rows = not tested)  LOWER EXTREMITY ROM: WFL for activities assessed  LOWER EXTREMITY MMT:    MMT Right eval Left eval  Hip flexion 3+/5; uncomfortable 4/5  Hip extension    Hip abduction    Hip adduction    Hip internal rotation    Hip external rotation    Knee flexion 4/5; right knee pain  4+/5  Knee extension 4/5 5/5  Ankle dorsiflexion 4-/5 4-/5  Ankle plantarflexion    Ankle inversion    Ankle eversion     (Blank rows = not tested)  GAIT: Assistive device utilized: None Level of assistance: Complete Independence Comments: Decreased gait speed and stride length with minimal right knee flexion in swing phase  TREATMENT DATE:  08/25/23 EXERCISE LOG  Exercise Repetitions and Resistance Comments  Nustep  L4 x 15 minutes   Supine glute sets  2.5 minutes w/ 3 second hold    Double knee to chest  15 reps  LE support on red ball  Bent knee fall out  20 reps each    Supine hip ADD isometric  2.5 minutes w/ 5 second hold    Rocker board  2 minutes    Blank cell = exercise not performed today              08/19/23                       EXERCISE LOG  Exercise Repetitions and Resistance Comments  Nustep Lvl 3 x 15 mins    Ball Rollout 3 mins   [employer] 3 mins   SKTC 10 reps bil x 3 sec hold   LTR 10 reps each way x 3 sec hold   Rockerboard 3 mins   Towel Stretch (gastroc) 3 reps bil x 15 sec hold    Blank cell = exercise not performed today   PATIENT EDUCATION:  Education details:  healing, anatomy, activity modification, and walking program Person educated: Patient Education method: Explanation Education comprehension: verbalized understanding  HOME EXERCISE PROGRAM:   ASSESSMENT:  CLINICAL IMPRESSION: Today's treatment focused on supine interventions for improved lumbar mobility and isometric muscular strengthening. She required minimal cueing with today's new isometric interventions for proper force production to avoid aggravating her familiar symptoms. She experienced a mild increase in her familiar symptoms with gluteal sets, but this was able to be reduced with bent knee fall outs. She was educated on activity modification and pacing to avoid aggravating her familiar symptoms with household activities. She reported understanding and that she felt slightly better upon the conclusion of treatment. She continues to require skilled physical therapy to address her remaining impairments to return to her prior level of function.   OBJECTIVE IMPAIRMENTS: Abnormal gait, decreased activity tolerance, decreased balance, decreased mobility, difficulty walking, decreased ROM, decreased strength, hypomobility, impaired flexibility, impaired sensation, impaired tone, postural dysfunction, and pain.   ACTIVITY LIMITATIONS: carrying, lifting, bending, sitting, standing, and locomotion level  PARTICIPATION LIMITATIONS: meal prep, cleaning, laundry, driving, shopping, community activity, and occupation  PERSONAL FACTORS: Past/current experiences, Time since onset of injury/illness/exacerbation, and 3+ comorbidities: Hypertension, asthma, ADHD, depression, anxiety, and history of cancer  are also affecting patient's  functional outcome.   REHAB POTENTIAL: Good  CLINICAL DECISION MAKING: Evolving/moderate complexity  EVALUATION COMPLEXITY: Moderate   GOALS: Goals reviewed with patient? Yes  SHORT TERM GOALS: Target date: 09/07/23  Patient will be independent with her initial HEP. Baseline: Goal status: INITIAL  2.  Patient will be able to complete her daily activities without her familiar symptoms exceeding 6/10. Baseline:  Goal status: INITIAL  3.  Patient will improve her modified Oswestry score to 50% or less for improved perceived function with her daily activities. Baseline:  Goal status: INITIAL  4.  Patient will improve her right hip flexor strength to at least 4-/5 for improved function with her daily activities. Baseline:  Goal status: INITIAL  LONG TERM GOALS: Target date: 09/28/23  Patient will be independent with her advanced HEP. Baseline:  Goal status: INITIAL  2.  Patient will be able to complete her daily activities without her familiar symptoms exceeding 4/10. Baseline:  Goal status: INITIAL  3.  Patient will be able to demonstrate proper lifting  mechanics for a reduced risk of reinjury. Baseline:  Goal status: INITIAL  4.  Patient will improve her modified Oswestry score to 40% or less for improved perceived function with her daily activities. Baseline:  Goal status: INITIAL  5.  Patient will be able to ambulate with minimal to no significant gait deviations. Baseline:  Goal status: INITIAL  PLAN:  PT FREQUENCY: 2x/week  PT DURATION: 6 weeks  PLANNED INTERVENTIONS: 97164- PT Re-evaluation, 97110-Therapeutic exercises, 97530- Therapeutic activity, [id number]- Neuromuscular re-education, 97535- Self Care, 16109- Manual therapy, [id number]- Gait training, 97014- Electrical stimulation (unattended), 97035- Ultrasound, Patient/Family education, Balance training, Taping, Dry Needling, Joint mobilization, Spinal mobilization, Cryotherapy, and Moist heat.  PLAN FOR NEXT  SESSION: NuStep, lumbar isometrics, lifting mechanics, postural reeducation, and modalities as needed   Granville Lewis, PT 08/25/2023, 10:03 AM

## 2023-08-27 ENCOUNTER — Encounter: Payer: Self-pay | Admitting: *Deleted

## 2023-08-27 ENCOUNTER — Ambulatory Visit: Payer: 59 | Admitting: *Deleted

## 2023-08-27 DIAGNOSIS — M6281 Muscle weakness (generalized): Secondary | ICD-10-CM

## 2023-08-27 DIAGNOSIS — M5416 Radiculopathy, lumbar region: Secondary | ICD-10-CM

## 2023-08-27 NOTE — Therapy (Signed)
OUTPATIENT PHYSICAL THERAPY THORACOLUMBAR TREATMENT   Patient Name: Deanna [name] MRN: [id number] DOB:[birth year], 63 y.o., female Today's Date: 08/27/2023  END OF SESSION:  PT End of Session - 08/27/23 0849     Visit Number 4    Number of Visits 12    Date for PT Re-Evaluation 10/23/23    PT Start Time 0845    PT Stop Time 0935    PT Time Calculation (min) 50 min              Past Medical History:  Diagnosis Date   Cancer (HCC)    breast   Diverticulosis of sigmoid colon 07/14/2011   GERD (gastroesophageal reflux disease)    Migraine    PONV (postoperative nausea and vomiting)    Tubular adenoma of colon    Past Surgical History:  Procedure Laterality Date   ABDOMINAL HYSTERECTOMY  02/2010   partial   BREAST REDUCTION SURGERY  02/2011   BREAST SURGERY     cancer   CHOLECYSTECTOMY  07/17/11   w/IOC   COLONOSCOPY     IR INJECT/THERA/INC NEEDLE/CATH/PLC EPI/LUMB/SAC W/IMG  07/20/2023   IR KYPHO LUMBAR INC FX REDUCE BONE BX UNI/BIL CANNULATION INC/IMAGING  05/28/2023   IR RADIOLOGIST EVAL & MGMT  05/20/2023   IR RADIOLOGIST EVAL & MGMT  06/08/2023   NASAL SINUS SURGERY  02/2011   TONSILLECTOMY  age 36   Patient Active Problem List   Diagnosis Date Noted   BMI 40.0-44.9, adult (HCC) 04/24/2021   Depression, major, single episode, mild (HCC) 03/18/2018   GAD (generalized anxiety disorder) 03/18/2018   Attention deficit hyperactivity disorder (ADHD), predominantly inattentive type 01/20/2018   Migraine 11/12/2017   Asthma 09/05/2016   GERD (gastroesophageal reflux disease) 07/14/2011   Diverticulosis of sigmoid colon 07/14/2011   ESSENTIAL HYPERTENSION, BENIGN 06/07/2009    PCP: Bennie Pierini, FNP  REFERRING PROVIDER: Dorothy Spark, PA-C   REFERRING DIAG: Vertebrogenic low back pain   Rationale for Evaluation and Treatment: Rehabilitation  THERAPY DIAG:  Radiculopathy, lumbar region  Muscle weakness (generalized)  ONSET DATE: August  2024  SUBJECTIVE:                                                                                                                                                                                           SUBJECTIVE STATEMENT: Patient reports trying to modify activity at home to not trigger pain.   PERTINENT HISTORY:  Hypertension, asthma, ADHD, depression, anxiety, and history of cancer  PAIN:  Are you having pain? Yes: NPRS scale: 1.5-2/10 Pain location: low back radiating to the right medial knee Pain description: constant  ache and shooting Aggravating factors: prolonged sitting (45 minutes at most), standing (45 minutes at most), cooking, cleaning, and walking Relieving factors: heating pad  PRECAUTIONS: Fall  RED FLAGS: None   WEIGHT BEARING RESTRICTIONS: No  FALLS:  Has patient fallen in last 6 months? Yes. Number of falls 2  LIVING ENVIRONMENT: Lives with: lives alone Lives in: House/apartment Stairs: No Has following equipment at home: None  OCCUPATION: [employer]: primarily sitting, but may lift up to 25 pounds, 9 hour shift, but not working currently and she is undecided if she will return  PLOF: Independent  PATIENT GOALS: return to her exercise program, improved posture, improved lifting mechanics, and be able to go outside and hike or work in her garden    NEXT MD VISIT: 09/03/23  OBJECTIVE:  Note: Objective measures were completed at Evaluation unless otherwise noted.  DIAGNOSTIC FINDINGS:  IMPRESSION: 1. No acute fracture or traumatic listhesis of the thoracic or lumbar spine. 2. Postoperative changes of L1 vertebral augmentation. No new fracture of the lumbar spine. 3. Moderate right neural foraminal narrowing at L3-L4 and L4-L5.  PATIENT SURVEYS:  Modified Oswestry 60% disability   COGNITION: Overall cognitive status: Within functional limits for tasks assessed     SENSATION: Patient reports intermittent numbness in her right hip.    POSTURE: increased lumbar lordosis  PALPATION: TTP: bilateral lumbar paraspinals, right QL, and L2-5 spinous processes  LUMBAR ROM:   AROM eval  Flexion 64  Extension 14; "tightness and pulling"   Right lateral flexion   Left lateral flexion   Right rotation 25% limited; "pulling down to the right knee"   Left rotation 25% limited   (Blank rows = not tested)  LOWER EXTREMITY ROM: WFL for activities assessed  LOWER EXTREMITY MMT:    MMT Right eval Left eval  Hip flexion 3+/5; uncomfortable 4/5  Hip extension    Hip abduction    Hip adduction    Hip internal rotation    Hip external rotation    Knee flexion 4/5; right knee pain  4+/5  Knee extension 4/5 5/5  Ankle dorsiflexion 4-/5 4-/5  Ankle plantarflexion    Ankle inversion    Ankle eversion     (Blank rows = not tested)  GAIT: Assistive device utilized: None Level of assistance: Complete Independence Comments: Decreased gait speed and stride length with minimal right knee flexion in swing phase  TREATMENT DATE:                                                                                                                                                                 08/27/23 EXERCISE LOG   LB Nustep   x 15 mins L3 AB bracing practiced for transitional movements to decrease pain triggers with  ADL's Reviewed and practiced  movement patterns for ADL's to decrease pain triggers with transitional movements.           Exercise Repetitions and Resistance Comments  Nustep  L4 x 15 minutes   Supine glute sets  2.5 minutes w/ 3 second hold    Double knee to chest   LE support on red ball  Bent knee fall out  20 reps each    Supine hip ADD isometric  2.5 minutes w/ 5 second hold    Rocker board  2 minutes    Blank cell = exercise not performed today               08/19/23                       EXERCISE LOG  Exercise Repetitions and Resistance Comments  Nustep Lvl 3 x 15 mins   Ball Rollout 3 mins    [employer] 3 mins   SKTC 10 reps bil x 3 sec hold   LTR 10 reps each way x 3 sec hold   Rockerboard 3 mins   Towel Stretch (gastroc) 3 reps bil x 15 sec hold    Blank cell = exercise not performed today   PATIENT EDUCATION:  Education details:  healing, anatomy, activity modification, and walking program Person educated: Patient Education method: Explanation Education comprehension: verbalized understanding  HOME EXERCISE PROGRAM:   ASSESSMENT:  CLINICAL IMPRESSION: Today's treatment focused on therex as well as movement patterns  and AB bracing to decrease pain triggers with ADL's.  Pt did well  with performing AB brace and practicing transitional movements. ADD stability exs next RX    OBJECTIVE IMPAIRMENTS: Abnormal gait, decreased activity tolerance, decreased balance, decreased mobility, difficulty walking, decreased ROM, decreased strength, hypomobility, impaired flexibility, impaired sensation, impaired tone, postural dysfunction, and pain.   ACTIVITY LIMITATIONS: carrying, lifting, bending, sitting, standing, and locomotion level  PARTICIPATION LIMITATIONS: meal prep, cleaning, laundry, driving, shopping, community activity, and occupation  PERSONAL FACTORS: Past/current experiences, Time since onset of injury/illness/exacerbation, and 3+ comorbidities: Hypertension, asthma, ADHD, depression, anxiety, and history of cancer  are also affecting patient's functional outcome.   REHAB POTENTIAL: Good  CLINICAL DECISION MAKING: Evolving/moderate complexity  EVALUATION COMPLEXITY: Moderate   GOALS: Goals reviewed with patient? Yes  SHORT TERM GOALS: Target date: 09/07/23  Patient will be independent with her initial HEP. Baseline: Goal status: INITIAL  2.  Patient will be able to complete her daily activities without her familiar symptoms exceeding 6/10. Baseline:  Goal status: INITIAL  3.  Patient will improve her modified Oswestry score to 50% or less for  improved perceived function with her daily activities. Baseline:  Goal status: INITIAL  4.  Patient will improve her right hip flexor strength to at least 4-/5 for improved function with her daily activities. Baseline:  Goal status: INITIAL  LONG TERM GOALS: Target date: 09/28/23  Patient will be independent with her advanced HEP. Baseline:  Goal status: INITIAL  2.  Patient will be able to complete her daily activities without her familiar symptoms exceeding 4/10. Baseline:  Goal status: INITIAL  3.  Patient will be able to demonstrate proper lifting mechanics for a reduced risk of reinjury. Baseline:  Goal status: INITIAL  4.  Patient will improve her modified Oswestry score to 40% or less for improved perceived function with her daily activities. Baseline:  Goal status: INITIAL  5.  Patient will be able to ambulate  with minimal to no significant gait deviations. Baseline:  Goal status: INITIAL  PLAN:  PT FREQUENCY: 2x/week  PT DURATION: 6 weeks  PLANNED INTERVENTIONS: 97164- PT Re-evaluation, 97110-Therapeutic exercises, 97530- Therapeutic activity, [id number]- Neuromuscular re-education, 97535- Self Care, [id number]- Manual therapy, [id number]- Gait training, 97014- Electrical stimulation (unattended), 97035- Ultrasound, Patient/Family education, Balance training, Taping, Dry Needling, Joint mobilization, Spinal mobilization, Cryotherapy, and Moist heat.  PLAN FOR NEXT SESSION: NuStep, lumbar isometrics, lifting mechanics, postural reeducation, and modalities as needed   Ginia Rudell,CHRIS, PTA 08/27/2023, 6:02 PM

## 2023-08-31 ENCOUNTER — Ambulatory Visit: Payer: 59 | Attending: Orthopedic Surgery | Admitting: *Deleted

## 2023-08-31 ENCOUNTER — Encounter: Payer: Self-pay | Admitting: *Deleted

## 2023-08-31 DIAGNOSIS — M5416 Radiculopathy, lumbar region: Secondary | ICD-10-CM | POA: Diagnosis present

## 2023-08-31 DIAGNOSIS — M6281 Muscle weakness (generalized): Secondary | ICD-10-CM | POA: Insufficient documentation

## 2023-08-31 NOTE — Therapy (Signed)
OUTPATIENT PHYSICAL THERAPY THORACOLUMBAR TREATMENT   Patient Name: Deanna [name] MRN: [id number] DOB:[birth year], 63 y.o., female Today's Date: 08/31/2023  END OF SESSION:  PT End of Session - 08/31/23 0850     Visit Number 5    Number of Visits 12    Date for PT Re-Evaluation 10/23/23    PT Start Time 0845    PT Stop Time 0943    PT Time Calculation (min) 58 min              Past Medical History:  Diagnosis Date   Cancer (HCC)    breast   Diverticulosis of sigmoid colon 07/14/2011   GERD (gastroesophageal reflux disease)    Migraine    PONV (postoperative nausea and vomiting)    Tubular adenoma of colon    Past Surgical History:  Procedure Laterality Date   ABDOMINAL HYSTERECTOMY  02/2010   partial   BREAST REDUCTION SURGERY  02/2011   BREAST SURGERY     cancer   CHOLECYSTECTOMY  07/17/11   w/IOC   COLONOSCOPY     IR INJECT/THERA/INC NEEDLE/CATH/PLC EPI/LUMB/SAC W/IMG  07/20/2023   IR KYPHO LUMBAR INC FX REDUCE BONE BX UNI/BIL CANNULATION INC/IMAGING  05/28/2023   IR RADIOLOGIST EVAL & MGMT  05/20/2023   IR RADIOLOGIST EVAL & MGMT  06/08/2023   NASAL SINUS SURGERY  02/2011   TONSILLECTOMY  age 48   Patient Active Problem List   Diagnosis Date Noted   BMI 40.0-44.9, adult (HCC) 04/24/2021   Depression, major, single episode, mild (HCC) 03/18/2018   GAD (generalized anxiety disorder) 03/18/2018   Attention deficit hyperactivity disorder (ADHD), predominantly inattentive type 01/20/2018   Migraine 11/12/2017   Asthma 09/05/2016   GERD (gastroesophageal reflux disease) 07/14/2011   Diverticulosis of sigmoid colon 07/14/2011   ESSENTIAL HYPERTENSION, BENIGN 06/07/2009    PCP: Bennie Pierini, FNP  REFERRING PROVIDER: Dorothy Spark, PA-C   REFERRING DIAG: Vertebrogenic low back pain   Rationale for Evaluation and Treatment: Rehabilitation  THERAPY DIAG:  Radiculopathy, lumbar region  Muscle weakness (generalized)  ONSET DATE: August  2024  SUBJECTIVE:                                                                                                                                                                                           SUBJECTIVE STATEMENT: Patient reports 1-2/10 LBP this AM. , injection 05-28-23, RT LE pain constant, but Intensity changes. To MD Thursday   PERTINENT HISTORY:  Hypertension, asthma, ADHD, depression, anxiety, and history of cancer  PAIN:  Are you having pain? Yes: NPRS scale: 2/10 Pain location: low back radiating to  the right medial knee Pain description: constant ache and shooting Aggravating factors: prolonged sitting (45 minutes at most), standing (45 minutes at most), cooking, cleaning, and walking Relieving factors: heating pad  PRECAUTIONS: Fall  RED FLAGS: None   WEIGHT BEARING RESTRICTIONS: No  FALLS:  Has patient fallen in last 6 months? Yes. Number of falls 2  LIVING ENVIRONMENT: Lives with: lives alone Lives in: House/apartment Stairs: No Has following equipment at home: None  OCCUPATION: [employer]: primarily sitting, but may lift up to 25 pounds, 9 hour shift, but not working currently and she is undecided if she will return  PLOF: Independent  PATIENT GOALS: return to her exercise program, improved posture, improved lifting mechanics, and be able to go outside and hike or work in her garden    NEXT MD VISIT: 09/03/23  OBJECTIVE:  Note: Objective measures were completed at Evaluation unless otherwise noted.  DIAGNOSTIC FINDINGS:  IMPRESSION: 1. No acute fracture or traumatic listhesis of the thoracic or lumbar spine. 2. Postoperative changes of L1 vertebral augmentation. No new fracture of the lumbar spine. 3. Moderate right neural foraminal narrowing at L3-L4 and L4-L5.  PATIENT SURVEYS:  Modified Oswestry 60% disability   COGNITION: Overall cognitive status: Within functional limits for tasks assessed     SENSATION: Patient reports  intermittent numbness in her right hip.   POSTURE: increased lumbar lordosis  PALPATION: TTP: bilateral lumbar paraspinals, right QL, and L2-5 spinous processes  LUMBAR ROM:   AROM eval  Flexion 64  Extension 14; "tightness and pulling"   Right lateral flexion   Left lateral flexion   Right rotation 25% limited; "pulling down to the right knee"   Left rotation 25% limited   (Blank rows = not tested)  LOWER EXTREMITY ROM: WFL for activities assessed  LOWER EXTREMITY MMT:    MMT Right eval Left eval  Hip flexion 3+/5; uncomfortable 4/5  Hip extension    Hip abduction    Hip adduction    Hip internal rotation    Hip external rotation    Knee flexion 4/5; right knee pain  4+/5  Knee extension 4/5 5/5  Ankle dorsiflexion 4-/5 4-/5  Ankle plantarflexion    Ankle inversion    Ankle eversion     (Blank rows = not tested)  GAIT: Assistive device utilized: None Level of assistance: Complete Independence Comments: Decreased gait speed and stride length with minimal right knee flexion in swing phase  TREATMENT DATE:                                                                                                                                                                 08/31/23 EXERCISE LOG   LB Nustep x 5 mins TM 1.4 mph x 10 mins  2/10 RT sided pain AB bracing practiced for transitional movements to decrease pain triggers with ADL's again Standing modified bird-dog arm raise x 6 each hold 5 secs handout given for HEP 2 x daily IFC  x 15 mins [id number]  to RT side LB supine with wedge under LEs Discussed maybe trying marching in bed for HEP due to Pt unable to get into the floor to ex        Exercise Repetitions and Resistance Comments  Nustep  L4 x 15 minutes   Supine glute sets  2.5 minutes w/ 3 second hold    Double knee to chest   LE support on red ball  Bent knee fall out  20 reps each    Supine hip ADD isometric  2.5 minutes w/ 5 second hold    Rocker  board  2 minutes    Blank cell = exercise not performed today               08/19/23                       EXERCISE LOG  Exercise Repetitions and Resistance Comments  Nustep Lvl 3 x 15 mins   Ball Rollout 3 mins   [employer] 3 mins   SKTC 10 reps bil x 3 sec hold   LTR 10 reps each way x 3 sec hold   Rockerboard 3 mins   Towel Stretch (gastroc) 3 reps bil x 15 sec hold    Blank cell = exercise not performed today   PATIENT EDUCATION:  Education details:  healing, anatomy, activity modification, and walking program Person educated: Patient Education method: Explanation Education comprehension: verbalized understanding  HOME EXERCISE PROGRAM:   ASSESSMENT:  CLINICAL IMPRESSION: Today's treatment again focused on therex as well as movement patterns. She was able to perform the nustep and Treadmill today while maintaining 2/10 pain level. Standing modified bird-dog also performed today for stability without pain triggers. Handout given for HEP try hook-lying marching. IFC end of session tolerated well. Pain 1-2/10 end of session.      OBJECTIVE IMPAIRMENTS: Abnormal gait, decreased activity tolerance, decreased balance, decreased mobility, difficulty walking, decreased ROM, decreased strength, hypomobility, impaired flexibility, impaired sensation, impaired tone, postural dysfunction, and pain.   ACTIVITY LIMITATIONS: carrying, lifting, bending, sitting, standing, and locomotion level  PARTICIPATION LIMITATIONS: meal prep, cleaning, laundry, driving, shopping, community activity, and occupation  PERSONAL FACTORS: Past/current experiences, Time since onset of injury/illness/exacerbation, and 3+ comorbidities: Hypertension, asthma, ADHD, depression, anxiety, and history of cancer  are also affecting patient's functional outcome.   REHAB POTENTIAL: Good  CLINICAL DECISION MAKING: Evolving/moderate complexity  EVALUATION COMPLEXITY: Moderate   GOALS: Goals reviewed with  patient? Yes  SHORT TERM GOALS: Target date: 09/07/23  Patient will be independent with her initial HEP. Baseline: Goal status: Partially met  2.  Patient will be able to complete her daily activities without her familiar symptoms exceeding 6/10. Baseline:  Goal status: On going  3.  Patient will improve her modified Oswestry score to 50% or less for improved perceived function with her daily activities. Baseline:  Goal status: On going  4.  Patient will improve her right hip flexor strength to at least 4-/5 for improved function with her daily activities. Baseline:  Goal status: On going  LONG TERM GOALS: Target date: 09/28/23  Patient will be independent with her advanced HEP. Baseline:  Goal status: INITIAL  2.  Patient will be able to complete  her daily activities without her familiar symptoms exceeding 4/10. Baseline:  Goal status: INITIAL  3.  Patient will be able to demonstrate proper lifting mechanics for a reduced risk of reinjury. Baseline:  Goal status: INITIAL  4.  Patient will improve her modified Oswestry score to 40% or less for improved perceived function with her daily activities. Baseline:  Goal status: INITIAL  5.  Patient will be able to ambulate with minimal to no significant gait deviations. Baseline:  Goal status: INITIAL  PLAN:  PT FREQUENCY: 2x/week  PT DURATION: 6 weeks  PLANNED INTERVENTIONS: 97164- PT Re-evaluation, 97110-Therapeutic exercises, 97530- Therapeutic activity, [id number]- Neuromuscular re-education, 97535- Self Care, [id number]- Manual therapy, [id number]- Gait training, 97014- Electrical stimulation (unattended), 97035- Ultrasound, Patient/Family education, Balance training, Taping, Dry Needling, Joint mobilization, Spinal mobilization, Cryotherapy, and Moist heat.  PLAN FOR NEXT SESSION: NuStep, lumbar isometrics, lifting mechanics, postural reeducation, and modalities as needed   Cidney Kirkwood,CHRIS, PTA 08/31/2023, 11:54 AM

## 2023-09-04 ENCOUNTER — Other Ambulatory Visit: Payer: Self-pay | Admitting: Orthopedic Surgery

## 2023-09-04 ENCOUNTER — Encounter: Payer: 59 | Admitting: *Deleted

## 2023-09-04 DIAGNOSIS — M5451 Vertebrogenic low back pain: Secondary | ICD-10-CM

## 2023-09-08 ENCOUNTER — Ambulatory Visit: Payer: 59 | Admitting: *Deleted

## 2023-09-08 DIAGNOSIS — M6281 Muscle weakness (generalized): Secondary | ICD-10-CM

## 2023-09-08 DIAGNOSIS — M5416 Radiculopathy, lumbar region: Secondary | ICD-10-CM

## 2023-09-08 NOTE — Therapy (Signed)
OUTPATIENT PHYSICAL THERAPY THORACOLUMBAR TREATMENT   Patient Name: Deanna [name] MRN: [id number] DOB:1960/10/28, 63 y.o., female Today's Date: 09/08/2023  END OF SESSION:  PT End of Session - 09/08/23 0933     Visit Number 6    Number of Visits 12    Date for PT Re-Evaluation 10/23/23    PT Start Time 0930    PT Stop Time 1020    PT Time Calculation (min) 50 min              Past Medical History:  Diagnosis Date   Cancer (HCC)    breast   Diverticulosis of sigmoid colon 07/14/2011   GERD (gastroesophageal reflux disease)    Migraine    PONV (postoperative nausea and vomiting)    Tubular adenoma of colon    Past Surgical History:  Procedure Laterality Date   ABDOMINAL HYSTERECTOMY  02/2010   partial   BREAST REDUCTION SURGERY  02/2011   BREAST SURGERY     cancer   CHOLECYSTECTOMY  07/17/11   w/IOC   COLONOSCOPY     IR INJECT/THERA/INC NEEDLE/CATH/PLC EPI/LUMB/SAC W/IMG  07/20/2023   IR KYPHO LUMBAR INC FX REDUCE BONE BX UNI/BIL CANNULATION INC/IMAGING  05/28/2023   IR RADIOLOGIST EVAL & MGMT  05/20/2023   IR RADIOLOGIST EVAL & MGMT  06/08/2023   NASAL SINUS SURGERY  02/2011   TONSILLECTOMY  age 50   Patient Active Problem List   Diagnosis Date Noted   BMI 40.0-44.9, adult (HCC) 04/24/2021   Depression, major, single episode, mild (HCC) 03/18/2018   GAD (generalized anxiety disorder) 03/18/2018   Attention deficit hyperactivity disorder (ADHD), predominantly inattentive type 01/20/2018   Migraine 11/12/2017   Asthma 09/05/2016   GERD (gastroesophageal reflux disease) 07/14/2011   Diverticulosis of sigmoid colon 07/14/2011   ESSENTIAL HYPERTENSION, BENIGN 06/07/2009    PCP: Bennie Pierini, FNP  REFERRING PROVIDER: Dorothy Spark, PA-C   REFERRING DIAG: Vertebrogenic low back pain   Rationale for Evaluation and Treatment: Rehabilitation  THERAPY DIAG:  Radiculopathy, lumbar region  Muscle weakness (generalized)  ONSET DATE: August  2024  SUBJECTIVE:                                                                                                                                                                                           SUBJECTIVE STATEMENT: Patient reports 1-2/10 LBP this AM. ,MD (PA) stated she was pleased with current status. Possible MRI and epidural    PERTINENT HISTORY:  Hypertension, asthma, ADHD, depression, anxiety, and history of cancer  PAIN:  Are you having pain? Yes: NPRS scale: 2/10 Pain location: low back radiating  to the right medial knee Pain description: constant ache and shooting Aggravating factors: prolonged sitting (45 minutes at most), standing (45 minutes at most), cooking, cleaning, and walking Relieving factors: heating pad  PRECAUTIONS: Fall  RED FLAGS: None   WEIGHT BEARING RESTRICTIONS: No  FALLS:  Has patient fallen in last 6 months? Yes. Number of falls 2  LIVING ENVIRONMENT: Lives with: lives alone Lives in: House/apartment Stairs: No Has following equipment at home: None  OCCUPATION: [employer]: primarily sitting, but may lift up to 25 pounds, 9 hour shift, but not working currently and she is undecided if she will return  PLOF: Independent  PATIENT GOALS: return to her exercise program, improved posture, improved lifting mechanics, and be able to go outside and hike or work in her garden    NEXT MD VISIT: 09/03/23  OBJECTIVE:  Note: Objective measures were completed at Evaluation unless otherwise noted.  DIAGNOSTIC FINDINGS:  IMPRESSION: 1. No acute fracture or traumatic listhesis of the thoracic or lumbar spine. 2. Postoperative changes of L1 vertebral augmentation. No new fracture of the lumbar spine. 3. Moderate right neural foraminal narrowing at L3-L4 and L4-L5.  PATIENT SURVEYS:  Modified Oswestry 60% disability   COGNITION: Overall cognitive status: Within functional limits for tasks assessed     SENSATION: Patient reports  intermittent numbness in her right hip.   POSTURE: increased lumbar lordosis  PALPATION: TTP: bilateral lumbar paraspinals, right QL, and L2-5 spinous processes  LUMBAR ROM:   AROM eval  Flexion 64  Extension 14; "tightness and pulling"   Right lateral flexion   Left lateral flexion   Right rotation 25% limited; "pulling down to the right knee"   Left rotation 25% limited   (Blank rows = not tested)  LOWER EXTREMITY ROM: WFL for activities assessed  LOWER EXTREMITY MMT:    MMT Right eval Left eval  Hip flexion 3+/5; uncomfortable 4/5  Hip extension    Hip abduction    Hip adduction    Hip internal rotation    Hip external rotation    Knee flexion 4/5; right knee pain  4+/5  Knee extension 4/5 5/5  Ankle dorsiflexion 4-/5 4-/5  Ankle plantarflexion    Ankle inversion    Ankle eversion     (Blank rows = not tested)  GAIT: Assistive device utilized: None Level of assistance: Complete Independence Comments: Decreased gait speed and stride length with minimal right knee flexion in swing phase  TREATMENT DATE:                                                                                                                                                                 09/08/23 EXERCISE LOG   LB Nustep  x 16 mins   L3  AB  bracing  discussed and practiced again for transitional movements to decrease pain triggers with ADL's  Standing modified bird-dog arm raise x 6 each hold 5 secs and leg raise added today for HEP 2 x daily IFC  x 15 mins 80-150hz  to RT side LB supine with wedge under LEs Discussed maybe trying marching in bed for HEP due to Pt unable to get into the floor to exercise        Exercise Repetitions and Resistance Comments  Nustep  L4 x 15 minutes   Supine glute sets  2.5 minutes w/ 3 second hold    Double knee to chest   LE support on red ball  Bent knee fall out  20 reps each    Supine hip ADD isometric  2.5 minutes w/ 5 second hold    Rocker  board  2 minutes    Blank cell = exercise not performed today               08/19/23                       EXERCISE LOG  Exercise Repetitions and Resistance Comments  Nustep Lvl 3 x 15 mins   Ball Rollout 3 mins   [employer] 3 mins   SKTC 10 reps bil x 3 sec hold   LTR 10 reps each way x 3 sec hold   Rockerboard 3 mins   Towel Stretch (gastroc) 3 reps bil x 15 sec hold    Blank cell = exercise not performed today   PATIENT EDUCATION:  Education details:  healing, anatomy, activity modification, and walking program Person educated: Patient Education method: Explanation Education comprehension: verbalized understanding  HOME EXERCISE PROGRAM:   ASSESSMENT:  CLINICAL IMPRESSION: Pt reports MD pleased with status, but concerned on intermittent high pain levels. Today's treatment again focused on core activation and bracing therex as well as movement patterns.Pain today 2/10, but 6-7/10 at home this weekend.She was able to progress standing bird dog with leg raise with some LB tightness increase, but tolerated well.  OBJECTIVE IMPAIRMENTS: Abnormal gait, decreased activity tolerance, decreased balance, decreased mobility, difficulty walking, decreased ROM, decreased strength, hypomobility, impaired flexibility, impaired sensation, impaired tone, postural dysfunction, and pain.   ACTIVITY LIMITATIONS: carrying, lifting, bending, sitting, standing, and locomotion level  PARTICIPATION LIMITATIONS: meal prep, cleaning, laundry, driving, shopping, community activity, and occupation  PERSONAL FACTORS: Past/current experiences, Time since onset of injury/illness/exacerbation, and 3+ comorbidities: Hypertension, asthma, ADHD, depression, anxiety, and history of cancer  are also affecting patient's functional outcome.   REHAB POTENTIAL: Good  CLINICAL DECISION MAKING: Evolving/moderate complexity  EVALUATION COMPLEXITY: Moderate   GOALS: Goals reviewed with patient? Yes  SHORT TERM  GOALS: Target date: 09/07/23  Patient will be independent with her initial HEP. Baseline: Goal status: Partially met  2.  Patient will be able to complete her daily activities without her familiar symptoms exceeding 6/10. Baseline:  Goal status: On going  3.  Patient will improve her modified Oswestry score to 50% or less for improved perceived function with her daily activities. Baseline:  Goal status: On going  4.  Patient will improve her right hip flexor strength to at least 4-/5 for improved function with her daily activities. Baseline:  Goal status: On going  LONG TERM GOALS: Target date: 09/28/23  Patient will be independent with her advanced HEP. Baseline:  Goal status: INITIAL  2.  Patient will be able to complete her daily activities  without her familiar symptoms exceeding 4/10. Baseline:  Goal status: INITIAL  3.  Patient will be able to demonstrate proper lifting mechanics for a reduced risk of reinjury. Baseline:  Goal status: INITIAL  4.  Patient will improve her modified Oswestry score to 40% or less for improved perceived function with her daily activities. Baseline:  Goal status: INITIAL  5.  Patient will be able to ambulate with minimal to no significant gait deviations. Baseline:  Goal status: INITIAL  PLAN:  PT FREQUENCY: 2x/week  PT DURATION: 6 weeks  PLANNED INTERVENTIONS: 97164- PT Re-evaluation, 97110-Therapeutic exercises, 97530- Therapeutic activity, [id number]- Neuromuscular re-education, 97535- Self Care, [id number]- Manual therapy, [id number]- Gait training, 97014- Electrical stimulation (unattended), 97035- Ultrasound, Patient/Family education, Balance training, Taping, Dry Needling, Joint mobilization, Spinal mobilization, Cryotherapy, and Moist heat.  PLAN FOR NEXT SESSION: NuStep, lumbar isometrics, lifting mechanics, postural reeducation, and modalities as needed   Oluwatosin Bracy,CHRIS, PTA 09/08/2023, 11:45 AM

## 2023-09-11 ENCOUNTER — Encounter: Payer: Self-pay | Admitting: Orthopedic Surgery

## 2023-09-15 ENCOUNTER — Encounter: Payer: Self-pay | Admitting: *Deleted

## 2023-09-15 ENCOUNTER — Ambulatory Visit: Payer: 59 | Admitting: *Deleted

## 2023-09-15 DIAGNOSIS — M6281 Muscle weakness (generalized): Secondary | ICD-10-CM

## 2023-09-15 DIAGNOSIS — M5416 Radiculopathy, lumbar region: Secondary | ICD-10-CM | POA: Diagnosis not present

## 2023-09-15 NOTE — Therapy (Signed)
OUTPATIENT PHYSICAL THERAPY THORACOLUMBAR TREATMENT   Patient Name: Deanna [name] MRN: [id number] DOB:1960-08-28, 63 y.o., female Today's Date: 09/15/2023  END OF SESSION:  PT End of Session - 09/15/23 0947     Visit Number 7    Number of Visits 12    Date for PT Re-Evaluation 10/23/23    PT Start Time 0930    PT Stop Time 1020    PT Time Calculation (min) 50 min              Past Medical History:  Diagnosis Date   Cancer (HCC)    breast   Diverticulosis of sigmoid colon 07/14/2011   GERD (gastroesophageal reflux disease)    Migraine    PONV (postoperative nausea and vomiting)    Tubular adenoma of colon    Past Surgical History:  Procedure Laterality Date   ABDOMINAL HYSTERECTOMY  02/2010   partial   BREAST REDUCTION SURGERY  02/2011   BREAST SURGERY     cancer   CHOLECYSTECTOMY  07/17/11   w/IOC   COLONOSCOPY     IR INJECT/THERA/INC NEEDLE/CATH/PLC EPI/LUMB/SAC W/IMG  07/20/2023   IR KYPHO LUMBAR INC FX REDUCE BONE BX UNI/BIL CANNULATION INC/IMAGING  05/28/2023   IR RADIOLOGIST EVAL & MGMT  05/20/2023   IR RADIOLOGIST EVAL & MGMT  06/08/2023   NASAL SINUS SURGERY  02/2011   TONSILLECTOMY  age 31   Patient Active Problem List   Diagnosis Date Noted   BMI 40.0-44.9, adult (HCC) 04/24/2021   Depression, major, single episode, mild (HCC) 03/18/2018   GAD (generalized anxiety disorder) 03/18/2018   Attention deficit hyperactivity disorder (ADHD), predominantly inattentive type 01/20/2018   Migraine 11/12/2017   Asthma 09/05/2016   GERD (gastroesophageal reflux disease) 07/14/2011   Diverticulosis of sigmoid colon 07/14/2011   ESSENTIAL HYPERTENSION, BENIGN 06/07/2009    PCP: Bennie Pierini, FNP  REFERRING PROVIDER: Dorothy Spark, PA-C   REFERRING DIAG: Vertebrogenic low back pain   Rationale for Evaluation and Treatment: Rehabilitation  THERAPY DIAG:  Radiculopathy, lumbar region  Muscle weakness (generalized)  ONSET DATE: August  2024  SUBJECTIVE:                                                                                                                                                                                           SUBJECTIVE STATEMENT: Patient reports 1-2/10 LBP this AM. ,MD (PA) stated she was pleased with current status. Possible MRI and epidural Epidural next Wednesday   PERTINENT HISTORY:  Hypertension, asthma, ADHD, depression, anxiety, and history of cancer  PAIN:  Are you having pain? Yes: NPRS scale: 2/10 Pain location: low  back radiating to the right medial knee Pain description: constant ache and shooting Aggravating factors: prolonged sitting (45 minutes at most), standing (45 minutes at most), cooking, cleaning, and walking Relieving factors: heating pad  PRECAUTIONS: Fall  RED FLAGS: None   WEIGHT BEARING RESTRICTIONS: No  FALLS:  Has patient fallen in last 6 months? Yes. Number of falls 2  LIVING ENVIRONMENT: Lives with: lives alone Lives in: House/apartment Stairs: No Has following equipment at home: None  OCCUPATION: [employer]: primarily sitting, but may lift up to 25 pounds, 9 hour shift, but not working currently and she is undecided if she will return  PLOF: Independent  PATIENT GOALS: return to her exercise program, improved posture, improved lifting mechanics, and be able to go outside and hike or work in her garden    NEXT MD VISIT: 09/03/23  OBJECTIVE:  Note: Objective measures were completed at Evaluation unless otherwise noted.  DIAGNOSTIC FINDINGS:  IMPRESSION: 1. No acute fracture or traumatic listhesis of the thoracic or lumbar spine. 2. Postoperative changes of L1 vertebral augmentation. No new fracture of the lumbar spine. 3. Moderate right neural foraminal narrowing at L3-L4 and L4-L5.  PATIENT SURVEYS:  Modified Oswestry 60% disability   COGNITION: Overall cognitive status: Within functional limits for tasks  assessed     SENSATION: Patient reports intermittent numbness in her right hip.   POSTURE: increased lumbar lordosis  PALPATION: TTP: bilateral lumbar paraspinals, right QL, and L2-5 spinous processes  LUMBAR ROM:   AROM eval  Flexion 64  Extension 14; "tightness and pulling"   Right lateral flexion   Left lateral flexion   Right rotation 25% limited; "pulling down to the right knee"   Left rotation 25% limited   (Blank rows = not tested)  LOWER EXTREMITY ROM: WFL for activities assessed  LOWER EXTREMITY MMT:    MMT Right eval Left eval  Hip flexion 3+/5; uncomfortable 4/5  Hip extension    Hip abduction    Hip adduction    Hip internal rotation    Hip external rotation    Knee flexion 4/5; right knee pain  4+/5  Knee extension 4/5 5/5  Ankle dorsiflexion 4-/5 4-/5  Ankle plantarflexion    Ankle inversion    Ankle eversion     (Blank rows = not tested)  GAIT: Assistive device utilized: None Level of assistance: Complete Independence Comments: Decreased gait speed and stride length with minimal right knee flexion in swing phase  TREATMENT DATE:                                                                                                                                                                 09/15/23 EXERCISE LOG   LB Nustep  x 16 mins   L3  AB bracing reviwed for transitional movements to decrease pain triggers with ADL's  Standing modified bird-dog arm raise x 6 each hold 5 secs and leg raise IFC  x 15 mins 80-150hz  to RT side LB supine with wedge under LE  XTS blue band   ROWS and ext x 10 hold 5 secs. HEP: green tband  and handout given        Exercise Repetitions and Resistance Comments  Nustep  L4 x 15 minutes   Supine glute sets  2.5 minutes w/ 3 second hold    Double knee to chest   LE support on red ball  Bent knee fall out  20 reps each    Supine hip ADD isometric  2.5 minutes w/ 5 second hold    Rocker board  2 minutes    Blank  cell = exercise not performed today               08/19/23                       EXERCISE LOG  Exercise Repetitions and Resistance Comments  Nustep Lvl 3 x 15 mins   Ball Rollout 3 mins   [employer] 3 mins   SKTC 10 reps bil x 3 sec hold   LTR 10 reps each way x 3 sec hold   Rockerboard 3 mins   Towel Stretch (gastroc) 3 reps bil x 15 sec hold    Blank cell = exercise not performed today   PATIENT EDUCATION:  Education details:  healing, anatomy, activity modification, and walking program Person educated: Patient Education method: Explanation Education comprehension: verbalized understanding  HOME EXERCISE PROGRAM:   ASSESSMENT:  CLINICAL IMPRESSION: Pt arrived today doing fairly well with LBP2/10, but reports that she hasn't had to do much today. Rx focused again on core activation and strengthening exs without pain triggers. Standing XTS blue bands ext and rows added Today and did well. Green tband for HEP ws/ Ext.    OBJECTIVE IMPAIRMENTS: Abnormal gait, decreased activity tolerance, decreased balance, decreased mobility, difficulty walking, decreased ROM, decreased strength, hypomobility, impaired flexibility, impaired sensation, impaired tone, postural dysfunction, and pain.   ACTIVITY LIMITATIONS: carrying, lifting, bending, sitting, standing, and locomotion level  PARTICIPATION LIMITATIONS: meal prep, cleaning, laundry, driving, shopping, community activity, and occupation  PERSONAL FACTORS: Past/current experiences, Time since onset of injury/illness/exacerbation, and 3+ comorbidities: Hypertension, asthma, ADHD, depression, anxiety, and history of cancer  are also affecting patient's functional outcome.   REHAB POTENTIAL: Good  CLINICAL DECISION MAKING: Evolving/moderate complexity  EVALUATION COMPLEXITY: Moderate   GOALS: Goals reviewed with patient? Yes  SHORT TERM GOALS: Target date: 09/07/23  Patient will be independent with her initial  HEP. Baseline: Goal status: Partially met  2.  Patient will be able to complete her daily activities without her familiar symptoms exceeding 6/10. Baseline:  Goal status: On going  6-7/10  3.  Patient will improve her modified Oswestry (ODI) score to 50% or less for improved perceived function with her daily activities. Baseline:  Goal status: On going  4.  Patient will improve her right hip flexor strength to at least 4-/5 for improved function with her daily activities. Baseline:  Goal status: On going  LONG TERM GOALS: Target date: 09/28/23  Patient will be independent with her advanced HEP. Baseline:  Goal status: INITIAL  2.  Patient will be able to complete her daily activities without her familiar symptoms exceeding 4/10. Baseline:  Goal  status: INITIAL  3.  Patient will be able to demonstrate proper lifting mechanics for a reduced risk of reinjury. Baseline:  Goal status: INITIAL  4.  Patient will improve her modified Oswestry score to 40% or less for improved perceived function with her daily activities. Baseline:  Goal status: INITIAL  5.  Patient will be able to ambulate with minimal to no significant gait deviations. Baseline:  Goal status: INITIAL  PLAN:  PT FREQUENCY: 2x/week  PT DURATION: 6 weeks  PLANNED INTERVENTIONS: 97164- PT Re-evaluation, 97110-Therapeutic exercises, 97530- Therapeutic activity, [id number]- Neuromuscular re-education, 97535- Self Care, [id number]- Manual therapy, [id number]- Gait training, 97014- Electrical stimulation (unattended), 97035- Ultrasound, Patient/Family education, Balance training, Taping, Dry Needling, Joint mobilization, Spinal mobilization, Cryotherapy, and Moist heat.  PLAN FOR NEXT SESSION: NuStep, lumbar isometrics, lifting mechanics, postural reeducation, and modalities as needed   Annett Boxwell,CHRIS, PTA 09/15/2023, 12:59 PM

## 2023-09-15 NOTE — Therapy (Signed)
OUTPATIENT PHYSICAL THERAPY THORACOLUMBAR TREATMENT   Patient Name: Deanna [name] MRN: [id number] DOB:11-May-1961, 63 y.o., female Today's Date: 09/15/2023  END OF SESSION:  PT End of Session - 09/15/23 0947     Visit Number 7    Number of Visits 12    Date for PT Re-Evaluation 10/23/23    PT Start Time 0930    PT Stop Time 1020    PT Time Calculation (min) 50 min              Past Medical History:  Diagnosis Date   Cancer (HCC)    breast   Diverticulosis of sigmoid colon 07/14/2011   GERD (gastroesophageal reflux disease)    Migraine    PONV (postoperative nausea and vomiting)    Tubular adenoma of colon    Past Surgical History:  Procedure Laterality Date   ABDOMINAL HYSTERECTOMY  02/2010   partial   BREAST REDUCTION SURGERY  02/2011   BREAST SURGERY     cancer   CHOLECYSTECTOMY  07/17/11   w/IOC   COLONOSCOPY     IR INJECT/THERA/INC NEEDLE/CATH/PLC EPI/LUMB/SAC W/IMG  07/20/2023   IR KYPHO LUMBAR INC FX REDUCE BONE BX UNI/BIL CANNULATION INC/IMAGING  05/28/2023   IR RADIOLOGIST EVAL & MGMT  05/20/2023   IR RADIOLOGIST EVAL & MGMT  06/08/2023   NASAL SINUS SURGERY  02/2011   TONSILLECTOMY  age 68   Patient Active Problem List   Diagnosis Date Noted   BMI 40.0-44.9, adult (HCC) 04/24/2021   Depression, major, single episode, mild (HCC) 03/18/2018   GAD (generalized anxiety disorder) 03/18/2018   Attention deficit hyperactivity disorder (ADHD), predominantly inattentive type 01/20/2018   Migraine 11/12/2017   Asthma 09/05/2016   GERD (gastroesophageal reflux disease) 07/14/2011   Diverticulosis of sigmoid colon 07/14/2011   ESSENTIAL HYPERTENSION, BENIGN 06/07/2009    PCP: Bennie Pierini, FNP  REFERRING PROVIDER: Dorothy Spark, PA-C   REFERRING DIAG: Vertebrogenic low back pain   Rationale for Evaluation and Treatment: Rehabilitation  THERAPY DIAG:  Radiculopathy, lumbar region  Muscle weakness (generalized)  ONSET DATE: August  2024  SUBJECTIVE:                                                                                                                                                                                           SUBJECTIVE STATEMENT: Patient reports 1-2/10 LBP this AM. ,MD (PA) stated she was pleased with current status. Possible MRI and epidural Epidural next Wednesday   PERTINENT HISTORY:  Hypertension, asthma, ADHD, depression, anxiety, and history of cancer  PAIN:  Are you having pain? Yes: NPRS scale: 2/10 Pain location: low  back radiating to the right medial knee Pain description: constant ache and shooting Aggravating factors: prolonged sitting (45 minutes at most), standing (45 minutes at most), cooking, cleaning, and walking Relieving factors: heating pad  PRECAUTIONS: Fall  RED FLAGS: None   WEIGHT BEARING RESTRICTIONS: No  FALLS:  Has patient fallen in last 6 months? Yes. Number of falls 2  LIVING ENVIRONMENT: Lives with: lives alone Lives in: House/apartment Stairs: No Has following equipment at home: None  OCCUPATION: [employer]: primarily sitting, but may lift up to 25 pounds, 9 hour shift, but not working currently and she is undecided if she will return  PLOF: Independent  PATIENT GOALS: return to her exercise program, improved posture, improved lifting mechanics, and be able to go outside and hike or work in her garden    NEXT MD VISIT: 09/03/23  OBJECTIVE:  Note: Objective measures were completed at Evaluation unless otherwise noted.  DIAGNOSTIC FINDINGS:  IMPRESSION: 1. No acute fracture or traumatic listhesis of the thoracic or lumbar spine. 2. Postoperative changes of L1 vertebral augmentation. No new fracture of the lumbar spine. 3. Moderate right neural foraminal narrowing at L3-L4 and L4-L5.  PATIENT SURVEYS:  Modified Oswestry 60% disability   COGNITION: Overall cognitive status: Within functional limits for tasks  assessed     SENSATION: Patient reports intermittent numbness in her right hip.   POSTURE: increased lumbar lordosis  PALPATION: TTP: bilateral lumbar paraspinals, right QL, and L2-5 spinous processes  LUMBAR ROM:   AROM eval  Flexion 64  Extension 14; "tightness and pulling"   Right lateral flexion   Left lateral flexion   Right rotation 25% limited; "pulling down to the right knee"   Left rotation 25% limited   (Blank rows = not tested)  LOWER EXTREMITY ROM: WFL for activities assessed  LOWER EXTREMITY MMT:    MMT Right eval Left eval  Hip flexion 3+/5; uncomfortable 4/5  Hip extension    Hip abduction    Hip adduction    Hip internal rotation    Hip external rotation    Knee flexion 4/5; right knee pain  4+/5  Knee extension 4/5 5/5  Ankle dorsiflexion 4-/5 4-/5  Ankle plantarflexion    Ankle inversion    Ankle eversion     (Blank rows = not tested)  GAIT: Assistive device utilized: None Level of assistance: Complete Independence Comments: Decreased gait speed and stride length with minimal right knee flexion in swing phase  TREATMENT DATE:                                                                                                                                                                 09/15/23 EXERCISE LOG   LB Nustep  x 16 mins   L3  AB bracing  discussed and practiced again for transitional movements to decrease pain triggers with ADL's  Standing modified bird-dog arm raise x 6 each hold 10 secs and leg raise  XTS blue band ROWS and EXT 2x10 each IFC  x 15 mins 80-150hz  to RT side LB supine with wedge under LEs Discussed maybe trying marching in bed for HEP due to Pt unable to get into the floor to exercise        Exercise Repetitions and Resistance Comments  Nustep  L4 x 15 minutes   Supine glute sets  2.5 minutes w/ 3 second hold    Double knee to chest   LE support on red ball  Bent knee fall out  20 reps each    Supine hip ADD  isometric  2.5 minutes w/ 5 second hold    Rocker board  2 minutes    Blank cell = exercise not performed today               08/19/23                       EXERCISE LOG  Exercise Repetitions and Resistance Comments  Nustep Lvl 3 x 15 mins   Ball Rollout 3 mins   [employer] 3 mins   SKTC 10 reps bil x 3 sec hold   LTR 10 reps each way x 3 sec hold   Rockerboard 3 mins   Towel Stretch (gastroc) 3 reps bil x 15 sec hold    Blank cell = exercise not performed today   PATIENT EDUCATION:  Education details:  healing, anatomy, activity modification, and walking program Person educated: Patient Education method: Explanation Education comprehension: verbalized understanding  HOME EXERCISE PROGRAM:   ASSESSMENT:  CLINICAL IMPRESSION: Pt reports doing good today, but haven't done much today. Treatment again focused on core activation and bracing therex as well as movement patterns. Core stabilization progressed with XTS blue tube ROWS and extension. Band given for HEP   OBJECTIVE IMPAIRMENTS: Abnormal gait, decreased activity tolerance, decreased balance, decreased mobility, difficulty walking, decreased ROM, decreased strength, hypomobility, impaired flexibility, impaired sensation, impaired tone, postural dysfunction, and pain.   ACTIVITY LIMITATIONS: carrying, lifting, bending, sitting, standing, and locomotion level  PARTICIPATION LIMITATIONS: meal prep, cleaning, laundry, driving, shopping, community activity, and occupation  PERSONAL FACTORS: Past/current experiences, Time since onset of injury/illness/exacerbation, and 3+ comorbidities: Hypertension, asthma, ADHD, depression, anxiety, and history of cancer  are also affecting patient's functional outcome.   REHAB POTENTIAL: Good  CLINICAL DECISION MAKING: Evolving/moderate complexity  EVALUATION COMPLEXITY: Moderate   GOALS: Goals reviewed with patient? Yes  SHORT TERM GOALS: Target date: 09/07/23  Patient will be  independent with her initial HEP. Baseline: Goal status: Partially met  2.  Patient will be able to complete her daily activities without her familiar symptoms exceeding 6/10. Baseline:  Goal status: On going  7/10  3.  Patient will improve her modified Oswestry score to 50% or less for improved perceived function with her daily activities. Baseline:  Goal status: On going  4.  Patient will improve her right hip flexor strength to at least 4-/5 for improved function with her daily activities. Baseline:  Goal status: On going  LONG TERM GOALS: Target date: 09/28/23  Patient will be independent with her advanced HEP. Baseline:  Goal status: INITIAL  2.  Patient will be able to complete her daily activities without her familiar symptoms exceeding 4/10. Baseline:  Goal status:  INITIAL  3.  Patient will be able to demonstrate proper lifting mechanics for a reduced risk of reinjury. Baseline:  Goal status: INITIAL  4.  Patient will improve her modified Oswestry score to 40% or less for improved perceived function with her daily activities. Baseline:  Goal status: INITIAL  5.  Patient will be able to ambulate with minimal to no significant gait deviations. Baseline:  Goal status: INITIAL  PLAN:  PT FREQUENCY: 2x/week  PT DURATION: 6 weeks  PLANNED INTERVENTIONS: 97164- PT Re-evaluation, 97110-Therapeutic exercises, 97530- Therapeutic activity, [id number]- Neuromuscular re-education, 97535- Self Care, 16109- Manual therapy, [id number]- Gait training, 97014- Electrical stimulation (unattended), 97035- Ultrasound, Patient/Family education, Balance training, Taping, Dry Needling, Joint mobilization, Spinal mobilization, Cryotherapy, and Moist heat.  PLAN FOR NEXT SESSION: NuStep, lumbar isometrics, lifting mechanics, postural reeducation, and modalities as needed   Donnis Phaneuf,CHRIS, PTA 09/15/2023, 1:13 PM

## 2023-09-16 ENCOUNTER — Other Ambulatory Visit: Payer: 59

## 2023-09-22 ENCOUNTER — Ambulatory Visit: Payer: 59 | Admitting: *Deleted

## 2023-09-22 DIAGNOSIS — M5416 Radiculopathy, lumbar region: Secondary | ICD-10-CM | POA: Diagnosis not present

## 2023-09-22 DIAGNOSIS — M6281 Muscle weakness (generalized): Secondary | ICD-10-CM

## 2023-09-22 NOTE — Discharge Instructions (Signed)

## 2023-09-22 NOTE — Therapy (Signed)
 OUTPATIENT PHYSICAL THERAPY THORACOLUMBAR TREATMENT   Patient Name: Deanna [name] MRN: [id number] DOB:Dec 14, 1960, 63 y.o., female Today's Date: 09/22/2023  END OF SESSION:  PT End of Session - 09/22/23 0930     Visit Number 8    Number of Visits 12    Date for PT Re-Evaluation 10/23/23    PT Start Time 0930    PT Stop Time 1029    PT Time Calculation (min) 59 min              Past Medical History:  Diagnosis Date   Cancer (HCC)    breast   Diverticulosis of sigmoid colon 07/14/2011   GERD (gastroesophageal reflux disease)    Migraine    PONV (postoperative nausea and vomiting)    Tubular adenoma of colon    Past Surgical History:  Procedure Laterality Date   ABDOMINAL HYSTERECTOMY  02/2010   partial   BREAST REDUCTION SURGERY  02/2011   BREAST SURGERY     cancer   CHOLECYSTECTOMY  07/17/11   w/IOC   COLONOSCOPY     IR INJECT/THERA/INC NEEDLE/CATH/PLC EPI/LUMB/SAC W/IMG  07/20/2023   IR KYPHO LUMBAR INC FX REDUCE BONE BX UNI/BIL CANNULATION INC/IMAGING  05/28/2023   IR RADIOLOGIST EVAL & MGMT  05/20/2023   IR RADIOLOGIST EVAL & MGMT  06/08/2023   NASAL SINUS SURGERY  02/2011   TONSILLECTOMY  age 28   Patient Active Problem List   Diagnosis Date Noted   BMI 40.0-44.9, adult (HCC) 04/24/2021   Depression, major, single episode, mild (HCC) 03/18/2018   GAD (generalized anxiety disorder) 03/18/2018   Attention deficit hyperactivity disorder (ADHD), predominantly inattentive type 01/20/2018   Migraine 11/12/2017   Asthma 09/05/2016   GERD (gastroesophageal reflux disease) 07/14/2011   Diverticulosis of sigmoid colon 07/14/2011   ESSENTIAL HYPERTENSION, BENIGN 06/07/2009    PCP: Bennie Pierini, FNP  REFERRING PROVIDER: Dorothy Spark, PA-C   REFERRING DIAG: Vertebrogenic low back pain   Rationale for Evaluation and Treatment: Rehabilitation  THERAPY DIAG:  Radiculopathy, lumbar region  Muscle weakness (generalized)  ONSET DATE: August  2024  SUBJECTIVE:                                                                                                                                                                                           SUBJECTIVE STATEMENT: Patient reports 1-2/10 LBP this AM. The epidural is tomorrow. 6-7/10 LBP yesterday. LT LE weakness Epidural next Wednesday   PERTINENT HISTORY:  Hypertension, asthma, ADHD, depression, anxiety, and history of cancer  PAIN:  Are you having pain? Yes: NPRS scale: 2/10 Pain location: low back radiating to  the right medial knee Pain description: constant ache and shooting Aggravating factors: prolonged sitting (45 minutes at most), standing (45 minutes at most), cooking, cleaning, and walking Relieving factors: heating pad  PRECAUTIONS: Fall  RED FLAGS: None   WEIGHT BEARING RESTRICTIONS: No  FALLS:  Has patient fallen in last 6 months? Yes. Number of falls 2  LIVING ENVIRONMENT: Lives with: lives alone Lives in: House/apartment Stairs: No Has following equipment at home: None  OCCUPATION: [employer]: primarily sitting, but may lift up to 25 pounds, 9 hour shift, but not working currently and she is undecided if she will return  PLOF: Independent  PATIENT GOALS: return to her exercise program, improved posture, improved lifting mechanics, and be able to go outside and hike or work in her garden    NEXT MD VISIT: 09/03/23  OBJECTIVE:  Note: Objective measures were completed at Evaluation unless otherwise noted.  DIAGNOSTIC FINDINGS:  IMPRESSION: 1. No acute fracture or traumatic listhesis of the thoracic or lumbar spine. 2. Postoperative changes of L1 vertebral augmentation. No new fracture of the lumbar spine. 3. Moderate right neural foraminal narrowing at L3-L4 and L4-L5.  PATIENT SURVEYS:  Modified Oswestry 60% disability   COGNITION: Overall cognitive status: Within functional limits for tasks assessed     SENSATION: Patient  reports intermittent numbness in her right hip.   POSTURE: increased lumbar lordosis  PALPATION: TTP: bilateral lumbar paraspinals, right QL, and L2-5 spinous processes  LUMBAR ROM:   AROM eval  Flexion 64  Extension 14; "tightness and pulling"   Right lateral flexion   Left lateral flexion   Right rotation 25% limited; "pulling down to the right knee"   Left rotation 25% limited   (Blank rows = not tested)  LOWER EXTREMITY ROM: WFL for activities assessed  LOWER EXTREMITY MMT:    MMT Right eval Left eval  Hip flexion 3+/5; uncomfortable 4/5  Hip extension    Hip abduction    Hip adduction    Hip internal rotation    Hip external rotation    Knee flexion 4/5; right knee pain  4+/5  Knee extension 4/5 5/5  Ankle dorsiflexion 4-/5 4-/5  Ankle plantarflexion    Ankle inversion    Ankle eversion     (Blank rows = not tested)  GAIT: Assistive device utilized: None Level of assistance: Complete Independence Comments: Decreased gait speed and stride length with minimal right knee flexion in swing phase  TREATMENT DATE:                                                                                                                                                                 09/22/23 EXERCISE LOG   LB Nustep  x 16 mins   L5  seat 7  AB bracing  discussed and practiced again for transitional movements to decrease pain triggers with ADL's  Standing modified bird-dog arm raise x 10 each hold 5 secs and leg raise  XTS blue band ROWS and EXT x 10 each  hold 5 secs  IFC  x 15 mins 80-150hz  to RT side LB supine with wedge under LEs        Exercise Repetitions and Resistance Comments  Nustep  L4 x 15 minutes   Supine glute sets  2.5 minutes w/ 3 second hold    Double knee to chest   LE support on red ball  Bent knee fall out  20 reps each    Supine hip ADD isometric  2.5 minutes w/ 5 second hold    Rocker board  2 minutes    Blank cell = exercise not performed  today               08/19/23                       EXERCISE LOG  Exercise Repetitions and Resistance Comments  Nustep Lvl 3 x 15 mins   Ball Rollout 3 mins   [employer] 3 mins   SKTC 10 reps bil x 3 sec hold   LTR 10 reps each way x 3 sec hold   Rockerboard 3 mins   Towel Stretch (gastroc) 3 reps bil x 15 sec hold    Blank cell = exercise not performed today   PATIENT EDUCATION:  Education details:  healing, anatomy, activity modification, and walking program Person educated: Patient Education method: Explanation Education comprehension: verbalized understanding  HOME EXERCISE PROGRAM:   ASSESSMENT:  CLINICAL IMPRESSION: Pt reports doing good this morning 2/10, but 6-7/10 yesterday. Epidural tomorrow. Treatment again focused on core activation and bracing therex as well as movement pattern review with AB bracing. STW to RT LB paras and QL with notable tenderness and hypersensitivity.    OBJECTIVE IMPAIRMENTS: Abnormal gait, decreased activity tolerance, decreased balance, decreased mobility, difficulty walking, decreased ROM, decreased strength, hypomobility, impaired flexibility, impaired sensation, impaired tone, postural dysfunction, and pain.   ACTIVITY LIMITATIONS: carrying, lifting, bending, sitting, standing, and locomotion level  PARTICIPATION LIMITATIONS: meal prep, cleaning, laundry, driving, shopping, community activity, and occupation  PERSONAL FACTORS: Past/current experiences, Time since onset of injury/illness/exacerbation, and 3+ comorbidities: Hypertension, asthma, ADHD, depression, anxiety, and history of cancer  are also affecting patient's functional outcome.   REHAB POTENTIAL: Good  CLINICAL DECISION MAKING: Evolving/moderate complexity  EVALUATION COMPLEXITY: Moderate   GOALS: Goals reviewed with patient? Yes  SHORT TERM GOALS: Target date: 09/07/23  Patient will be independent with her initial HEP. Baseline: Goal status: Partially met  2.   Patient will be able to complete her daily activities without her familiar symptoms exceeding 6/10. Baseline:  Goal status: On going  7/10  3.  Patient will improve her modified Oswestry score to 50% or less for improved perceived function with her daily activities. Baseline:  Goal status: On going  4.  Patient will improve her right hip flexor strength to at least 4-/5 for improved function with her daily activities. Baseline:  Goal status: On going  LONG TERM GOALS: Target date: 09/28/23  Patient will be independent with her advanced HEP. Baseline:  Goal status: On going  2.  Patient will be able to complete her daily activities without her familiar symptoms exceeding 4/10. Baseline:  Goal status: INITIAL  3.  Patient will be  able to demonstrate proper lifting mechanics for a reduced risk of reinjury. Baseline:  Goal status: INITIAL  4.  Patient will improve her modified Oswestry score to 40% or less for improved perceived function with her daily activities. Baseline:  Goal status: INITIAL  5.  Patient will be able to ambulate with minimal to no significant gait deviations. Baseline:  Goal status: INITIAL  PLAN:  PT FREQUENCY: 2x/week  PT DURATION: 6 weeks  PLANNED INTERVENTIONS: 97164- PT Re-evaluation, 97110-Therapeutic exercises, 97530- Therapeutic activity, [id number]- Neuromuscular re-education, 97535- Self Care, 86578- Manual therapy, [id number]- Gait training, 97014- Electrical stimulation (unattended), 97035- Ultrasound, Patient/Family education, Balance training, Taping, Dry Needling, Joint mobilization, Spinal mobilization, Cryotherapy, and Moist heat.  PLAN FOR NEXT SESSION: NuStep, lumbar isometrics, lifting mechanics, postural reeducation, and modalities as needed   Joelys Staubs,CHRIS, PTA 09/22/2023, 12:54 PM

## 2023-09-23 ENCOUNTER — Inpatient Hospital Stay
Admission: RE | Admit: 2023-09-23 | Discharge: 2023-09-23 | Disposition: A | Discharge status: Home / Self Care | Payer: 59 | Source: Ambulatory Visit | Attending: Orthopedic Surgery | Admitting: Orthopedic Surgery

## 2023-09-24 ENCOUNTER — Telehealth: Payer: Self-pay | Admitting: Student

## 2023-09-24 MED ORDER — PREDNISONE 50 MG PO TABS: ORAL_TABLET | ORAL | 0 refills | Status: DC

## 2023-09-24 NOTE — Telephone Encounter (Signed)
 Contrast allergy pre-medications sent to [facility]. VM left with instructions and request to call me back if she has questions.   Alwyn Ren, AGACNP-BC 09/24/2023, 9:04 AM

## 2023-09-25 ENCOUNTER — Encounter: Payer: 59 | Admitting: *Deleted

## 2023-09-28 ENCOUNTER — Ambulatory Visit (HOSPITAL_COMMUNITY)
Admission: RE | Admit: 2023-09-28 | Discharge: 2023-09-28 | Disposition: A | Discharge status: Home / Self Care | Payer: 59 | Source: Ambulatory Visit | Attending: Orthopedic Surgery | Admitting: Orthopedic Surgery

## 2023-09-28 DIAGNOSIS — M5451 Vertebrogenic low back pain: Secondary | ICD-10-CM | POA: Diagnosis present

## 2023-09-28 HISTORY — PX: IR INJECT/THERA/INC NEEDLE/CATH/PLC EPI/LUMB/SAC W/IMG: IMG6130

## 2023-09-28 MED ORDER — LIDOCAINE HCL (PF) 1 % IJ SOLN
INTRAMUSCULAR | Status: AC
  Filled 2023-09-28: qty 30

## 2023-09-28 MED ORDER — IOHEXOL 180 MG/ML  SOLN
20.0000 mL | Freq: Once | INTRAMUSCULAR | Status: AC | PRN
  Administered 2023-09-28: 2 mL via INTRATHECAL

## 2023-09-28 MED ORDER — METHYLPREDNISOLONE ACETATE 80 MG/ML IJ SUSP
80.0000 mg | Freq: Once | INTRAMUSCULAR | Status: DC
  Administered 2023-10-09: 80 mg via INTRA_ARTICULAR

## 2023-09-28 MED ORDER — METHYLPREDNISOLONE ACETATE 40 MG/ML IJ SUSP
INTRAMUSCULAR | Status: AC
  Filled 2023-09-28: qty 1

## 2023-09-28 MED ORDER — IOHEXOL 180 MG/ML  SOLN
INTRAMUSCULAR | Status: AC
Start: 2023-09-28 — End: ?
  Filled 2023-09-28: qty 10

## 2023-09-28 MED ORDER — LIDOCAINE HCL (PF) 1 % IJ SOLN
30.0000 mL | Freq: Once | INTRAMUSCULAR | Status: AC
  Administered 2023-09-28: 5 mL

## 2023-09-28 MED ORDER — METHYLPREDNISOLONE ACETATE 80 MG/ML IJ SUSP
INTRAMUSCULAR | Status: AC
  Filled 2023-09-28: qty 1

## 2023-09-28 NOTE — Procedures (Signed)
  Procedure:  Lumbar L4/5 ESI 80 mg depomedrol Preprocedure diagnosis: The encounter diagnosis was Vertebrogenic low back pain. Postprocedure diagnosis: same EBL:    minimal Complications:   none immediate  See full dictation in [employer].  Thora Lance MD Main # [phone] Pager  [phone] Mobile [phone]

## 2023-09-30 ENCOUNTER — Other Ambulatory Visit (HOSPITAL_COMMUNITY): Payer: Self-pay | Admitting: Orthopedic Surgery

## 2023-09-30 ENCOUNTER — Ambulatory Visit: Payer: 59 | Attending: Orthopedic Surgery | Admitting: *Deleted

## 2023-09-30 DIAGNOSIS — M545 Low back pain, unspecified: Secondary | ICD-10-CM

## 2023-09-30 DIAGNOSIS — M6281 Muscle weakness (generalized): Secondary | ICD-10-CM | POA: Diagnosis present

## 2023-09-30 DIAGNOSIS — M5416 Radiculopathy, lumbar region: Secondary | ICD-10-CM | POA: Insufficient documentation

## 2023-09-30 NOTE — Therapy (Signed)
 OUTPATIENT PHYSICAL THERAPY THORACOLUMBAR TREATMENT   Patient Name: Deanna [name] MRN: [id number] DOB:Apr 11, 1961, 63 y.o., female Today's Date: 09/30/2023  END OF SESSION:  PT End of Session - 09/30/23 0947     Visit Number 9    Number of Visits 12    Date for PT Re-Evaluation 10/23/23    PT Start Time 0920    PT Stop Time 0955    PT Time Calculation (min) 35 min              Past Medical History:  Diagnosis Date   Cancer (HCC)    breast   Diverticulosis of sigmoid colon 07/14/2011   GERD (gastroesophageal reflux disease)    Migraine    PONV (postoperative nausea and vomiting)    Tubular adenoma of colon    Past Surgical History:  Procedure Laterality Date   ABDOMINAL HYSTERECTOMY  02/2010   partial   BREAST REDUCTION SURGERY  02/2011   BREAST SURGERY     cancer   CHOLECYSTECTOMY  07/17/11   w/IOC   COLONOSCOPY     IR INJECT/THERA/INC NEEDLE/CATH/PLC EPI/LUMB/SAC W/IMG  07/20/2023   IR INJECT/THERA/INC NEEDLE/CATH/PLC EPI/LUMB/SAC W/IMG  09/28/2023   IR KYPHO LUMBAR INC FX REDUCE BONE BX UNI/BIL CANNULATION INC/IMAGING  05/28/2023   IR RADIOLOGIST EVAL & MGMT  05/20/2023   IR RADIOLOGIST EVAL & MGMT  06/08/2023   NASAL SINUS SURGERY  02/2011   TONSILLECTOMY  age 50   Patient Active Problem List   Diagnosis Date Noted   BMI 40.0-44.9, adult (HCC) 04/24/2021   Depression, major, single episode, mild (HCC) 03/18/2018   GAD (generalized anxiety disorder) 03/18/2018   Attention deficit hyperactivity disorder (ADHD), predominantly inattentive type 01/20/2018   Migraine 11/12/2017   Asthma 09/05/2016   GERD (gastroesophageal reflux disease) 07/14/2011   Diverticulosis of sigmoid colon 07/14/2011   ESSENTIAL HYPERTENSION, BENIGN 06/07/2009    PCP: Bennie Pierini, FNP  REFERRING PROVIDER: Dorothy Spark, PA-C   REFERRING DIAG: Vertebrogenic low back pain   Rationale for Evaluation and Treatment: Rehabilitation  THERAPY DIAG:  Radiculopathy,  lumbar region  Muscle weakness (generalized)  ONSET DATE: August 2024  SUBJECTIVE:                                                                                                                                                                                           SUBJECTIVE STATEMENT: Patient reports 1-2/10 LBP this AM. Had epidural 2 days ago.  LT LE weakness still. Not do much today because of epidural shot    PERTINENT HISTORY:  Hypertension, asthma, ADHD, depression, anxiety, and history of cancer  PAIN:  Are you having pain? Yes: NPRS scale: 1-2/10 Pain location: low back radiating to the right medial knee Pain description: constant ache and shooting Aggravating factors: prolonged sitting (45 minutes at most), standing (45 minutes at most), cooking, cleaning, and walking Relieving factors: heating pad  PRECAUTIONS: Fall  RED FLAGS: None   WEIGHT BEARING RESTRICTIONS: No  FALLS:  Has patient fallen in last 6 months? Yes. Number of falls 2  LIVING ENVIRONMENT: Lives with: lives alone Lives in: House/apartment Stairs: No Has following equipment at home: None  OCCUPATION: [employer]: primarily sitting, but may lift up to 25 pounds, 9 hour shift, but not working currently and she is undecided if she will return  PLOF: Independent  PATIENT GOALS: return to her exercise program, improved posture, improved lifting mechanics, and be able to go outside and hike or work in her garden    NEXT MD VISIT: 09/03/23  OBJECTIVE:  Note: Objective measures were completed at Evaluation unless otherwise noted.  DIAGNOSTIC FINDINGS:  IMPRESSION: 1. No acute fracture or traumatic listhesis of the thoracic or lumbar spine. 2. Postoperative changes of L1 vertebral augmentation. No new fracture of the lumbar spine. 3. Moderate right neural foraminal narrowing at L3-L4 and L4-L5.  PATIENT SURVEYS:  Modified Oswestry 60% disability   COGNITION: Overall cognitive  status: Within functional limits for tasks assessed     SENSATION: Patient reports intermittent numbness in her right hip.   POSTURE: increased lumbar lordosis  PALPATION: TTP: bilateral lumbar paraspinals, right QL, and L2-5 spinous processes  LUMBAR ROM:   AROM eval  Flexion 64  Extension 14; "tightness and pulling"   Right lateral flexion   Left lateral flexion   Right rotation 25% limited; "pulling down to the right knee"   Left rotation 25% limited   (Blank rows = not tested)  LOWER EXTREMITY ROM: WFL for activities assessed  LOWER EXTREMITY MMT:    MMT Right eval Left eval  Hip flexion 3+/5; uncomfortable 4/5  Hip extension    Hip abduction    Hip adduction    Hip internal rotation    Hip external rotation    Knee flexion 4/5; right knee pain  4+/5  Knee extension 4/5 5/5  Ankle dorsiflexion 4-/5 4-/5  Ankle plantarflexion    Ankle inversion    Ankle eversion     (Blank rows = not tested)  GAIT: Assistive device utilized: None Level of assistance: Complete Independence Comments: Decreased gait speed and stride length with minimal right knee flexion in swing phase  TREATMENT DATE:                                                                                                                                                                 09/30/23 EXERCISE LOG  LB Nustep  x 20 mins   L4 Doing better.  seat 7  Standing modified bird-dog arm raise x 5 each hold 5 secs and leg raise  Ball press down standing x 10 hold 5 secs        Exercise Repetitions and Resistance Comments  Nustep  L4 x 15 minutes   Supine glute sets  2.5 minutes w/ 3 second hold    Double knee to chest   LE support on red ball  Bent knee fall out  20 reps each    Supine hip ADD isometric  2.5 minutes w/ 5 second hold    Rocker board  2 minutes    Blank cell = exercise not performed today               08/19/23                       EXERCISE LOG  Exercise Repetitions and  Resistance Comments  Nustep Lvl 3 x 15 mins   Ball Rollout 3 mins   [employer] 3 mins   SKTC 10 reps bil x 3 sec hold   LTR 10 reps each way x 3 sec hold   Rockerboard 3 mins   Towel Stretch (gastroc) 3 reps bil x 15 sec hold    Blank cell = exercise not performed today   PATIENT EDUCATION:  Education details:  healing, anatomy, activity modification, and walking program Person educated: Patient Education method: Explanation Education comprehension: verbalized understanding  HOME EXERCISE PROGRAM:   ASSESSMENT:  CLINICAL IMPRESSION: Pt reports doing good this morning 1/10 and had epidural shot 2 days ago. Therex was decreased today due to epidural shot and mainly focused on core activation. No STW or HMP/ Estim today.    OBJECTIVE IMPAIRMENTS: Abnormal gait, decreased activity tolerance, decreased balance, decreased mobility, difficulty walking, decreased ROM, decreased strength, hypomobility, impaired flexibility, impaired sensation, impaired tone, postural dysfunction, and pain.   ACTIVITY LIMITATIONS: carrying, lifting, bending, sitting, standing, and locomotion level  PARTICIPATION LIMITATIONS: meal prep, cleaning, laundry, driving, shopping, community activity, and occupation  PERSONAL FACTORS: Past/current experiences, Time since onset of injury/illness/exacerbation, and 3+ comorbidities: Hypertension, asthma, ADHD, depression, anxiety, and history of cancer  are also affecting patient's functional outcome.   REHAB POTENTIAL: Good  CLINICAL DECISION MAKING: Evolving/moderate complexity  EVALUATION COMPLEXITY: Moderate   GOALS: Goals reviewed with patient? Yes  SHORT TERM GOALS: Target date: 09/07/23  Patient will be independent with her initial HEP. Baseline: Goal status: Partially met  2.  Patient will be able to complete her daily activities without her familiar symptoms exceeding 6/10. Baseline:  Goal status: On going  7/10  3.  Patient will improve her  modified Oswestry score to 50% or less for improved perceived function with her daily activities. Baseline:  Goal status: On going  4.  Patient will improve her right hip flexor strength to at least 4-/5 for improved function with her daily activities. Baseline:  Goal status: On going  LONG TERM GOALS: Target date: 09/28/23  Patient will be independent with her advanced HEP. Baseline:  Goal status: On going  2.  Patient will be able to complete her daily activities without her familiar symptoms exceeding 4/10. Baseline:  Goal status: INITIAL  3.  Patient will be able to demonstrate proper lifting mechanics for a reduced risk of reinjury. Baseline:  Goal status: INITIAL  4.  Patient will improve her modified Oswestry score to 40% or  less for improved perceived function with her daily activities. Baseline:  Goal status: INITIAL  5.  Patient will be able to ambulate with minimal to no significant gait deviations. Baseline:  Goal status: INITIAL  PLAN:  PT FREQUENCY: 2x/week  PT DURATION: 6 weeks  PLANNED INTERVENTIONS: 97164- PT Re-evaluation, 97110-Therapeutic exercises, 97530- Therapeutic activity, [id number]- Neuromuscular re-education, 97535- Self Care, 16109- Manual therapy, [id number]- Gait training, 97014- Electrical stimulation (unattended), 97035- Ultrasound, Patient/Family education, Balance training, Taping, Dry Needling, Joint mobilization, Spinal mobilization, Cryotherapy, and Moist heat.  PLAN FOR NEXT SESSION: NuStep, lumbar isometrics, lifting mechanics, postural reeducation, and modalities as needed   Leannah Guse,CHRIS, PTA 09/30/2023, 11:18 AM

## 2023-10-01 ENCOUNTER — Telehealth: Payer: Self-pay | Admitting: Nurse Practitioner

## 2023-10-01 DIAGNOSIS — F320 Major depressive disorder, single episode, mild: Secondary | ICD-10-CM

## 2023-10-01 MED ORDER — BUPROPION HCL ER (XL) 150 MG PO TB24: 450.0000 mg | ORAL_TABLET | Freq: Every day | ORAL | 5 refills | Status: DC

## 2023-10-01 NOTE — Telephone Encounter (Signed)
 Copied from CRM [id number]. Topic: Clinical - Prescription Issue >> Oct 01, 2023 12:47 PM Clayton Bibles wrote: Reason for CRM: Nicloe is calling about medication buPROPion (WELLBUTRIN XL) 150 MG 24 hr tablet. It is ordered for 1 tablet ( 150 mg total) by mouth daily. She has always taken 3 - 150 mg tablet daily for years. She I out of medication. Please call her at [phone]

## 2023-10-01 NOTE — Telephone Encounter (Signed)
 Wellbutrin prescription corrected Meds ordered this encounter  Medications   buPROPion (WELLBUTRIN XL) 150 MG 24 hr tablet    Sig: Take 3 tablets (450 mg total) by mouth daily.    Dispense:  90 tablet    Refill:  5    Supervising Provider:   Arville Care A [[id number]]   Mary-Margaret Daphine Deutscher, FNP

## 2023-10-01 NOTE — Addendum Note (Signed)
 Addended by: Bennie Pierini on: 10/01/2023 02:48 PM   Modules accepted: Orders

## 2023-10-06 ENCOUNTER — Ambulatory Visit: Admitting: *Deleted

## 2023-10-08 ENCOUNTER — Ambulatory Visit: Admitting: *Deleted

## 2023-10-08 ENCOUNTER — Encounter: Payer: Self-pay | Admitting: *Deleted

## 2023-10-08 DIAGNOSIS — M5416 Radiculopathy, lumbar region: Secondary | ICD-10-CM

## 2023-10-08 DIAGNOSIS — M6281 Muscle weakness (generalized): Secondary | ICD-10-CM

## 2023-10-08 NOTE — Therapy (Addendum)
 OUTPATIENT PHYSICAL THERAPY THORACOLUMBAR TREATMENT   Patient Name: Deanna [name] MRN: [id number] DOB:[birth year], 63 y.o., female Today's Date: 10/08/2023  END OF SESSION:  PT End of Session - 10/08/23 0945     Visit Number 10    Number of Visits 12    Date for PT Re-Evaluation 10/23/23    PT Start Time 0930    PT Stop Time 1020    PT Time Calculation (min) 50 min              Past Medical History:  Diagnosis Date   Cancer (HCC)    breast   Diverticulosis of sigmoid colon 07/14/2011   GERD (gastroesophageal reflux disease)    Migraine    PONV (postoperative nausea and vomiting)    Tubular adenoma of colon    Past Surgical History:  Procedure Laterality Date   ABDOMINAL HYSTERECTOMY  02/2010   partial   BREAST REDUCTION SURGERY  02/2011   BREAST SURGERY     cancer   CHOLECYSTECTOMY  07/17/11   w/IOC   COLONOSCOPY     IR INJECT/THERA/INC NEEDLE/CATH/PLC EPI/LUMB/SAC W/IMG  07/20/2023   IR INJECT/THERA/INC NEEDLE/CATH/PLC EPI/LUMB/SAC W/IMG  09/28/2023   IR KYPHO LUMBAR INC FX REDUCE BONE BX UNI/BIL CANNULATION INC/IMAGING  05/28/2023   IR RADIOLOGIST EVAL & MGMT  05/20/2023   IR RADIOLOGIST EVAL & MGMT  06/08/2023   NASAL SINUS SURGERY  02/2011   TONSILLECTOMY  age 66   Patient Active Problem List   Diagnosis Date Noted   BMI 40.0-44.9, adult (HCC) 04/24/2021   Depression, major, single episode, mild (HCC) 03/18/2018   GAD (generalized anxiety disorder) 03/18/2018   Attention deficit hyperactivity disorder (ADHD), predominantly inattentive type 01/20/2018   Migraine 11/12/2017   Asthma 09/05/2016   GERD (gastroesophageal reflux disease) 07/14/2011   Diverticulosis of sigmoid colon 07/14/2011   ESSENTIAL HYPERTENSION, BENIGN 06/07/2009    PCP: Bennie Pierini, FNP  REFERRING PROVIDER: Dorothy Spark, PA-C   REFERRING DIAG: Vertebrogenic low back pain   Rationale for Evaluation and Treatment: Rehabilitation  THERAPY DIAG:  Radiculopathy,  lumbar region  Muscle weakness (generalized)  ONSET DATE: August 2024  SUBJECTIVE:                                                                                                                                                                                           SUBJECTIVE STATEMENT: Patient reports 4-5/10 LBP/ RT glut pain. Tried to do a little yard work blowing and raking leaves and LB flared up.  To MD tomorrow. 30% better overall  PERTINENT HISTORY:  Hypertension, asthma, ADHD, depression, anxiety, and history of  cancer  PAIN:  Are you having pain? Yes: NPRS scale: 4-5/10 Pain location: low back radiating to the right medial knee Pain description: constant ache and shooting Aggravating factors: prolonged sitting (45 minutes at most), standing (45 minutes at most), cooking, cleaning, and walking Relieving factors: heating pad  PRECAUTIONS: Fall  RED FLAGS: None   WEIGHT BEARING RESTRICTIONS: No  FALLS:  Has patient fallen in last 6 months? Yes. Number of falls 2  LIVING ENVIRONMENT: Lives with: lives alone Lives in: House/apartment Stairs: No Has following equipment at home: None  OCCUPATION: [employer]: primarily sitting, but may lift up to 25 pounds, 9 hour shift, but not working currently and she is undecided if she will return  PLOF: Independent  PATIENT GOALS: return to her exercise program, improved posture, improved lifting mechanics, and be able to go outside and hike or work in her garden    NEXT MD VISIT: 09/03/23  OBJECTIVE:  Note: Objective measures were completed at Evaluation unless otherwise noted.  DIAGNOSTIC FINDINGS:  IMPRESSION: 1. No acute fracture or traumatic listhesis of the thoracic or lumbar spine. 2. Postoperative changes of L1 vertebral augmentation. No new fracture of the lumbar spine. 3. Moderate right neural foraminal narrowing at L3-L4 and L4-L5.  PATIENT SURVEYS:  Modified Oswestry 60% disability    COGNITION: Overall cognitive status: Within functional limits for tasks assessed     SENSATION: Patient reports intermittent numbness in her right hip.   POSTURE: increased lumbar lordosis  PALPATION: TTP: bilateral lumbar paraspinals, right QL, and L2-5 spinous processes  LUMBAR ROM:   AROM eval  Flexion 64  Extension 14; "tightness and pulling"   Right lateral flexion   Left lateral flexion   Right rotation 25% limited; "pulling down to the right knee"   Left rotation 25% limited   (Blank rows = not tested)  LOWER EXTREMITY ROM: WFL for activities assessed  LOWER EXTREMITY MMT:    MMT Right eval Left eval  Hip flexion 3+/5; uncomfortable 4/5  Hip extension    Hip abduction    Hip adduction    Hip internal rotation    Hip external rotation    Knee flexion 4/5; right knee pain  4+/5  Knee extension 4/5 5/5  Ankle dorsiflexion 4-/5 4-/5  Ankle plantarflexion    Ankle inversion    Ankle eversion     (Blank rows = not tested)  GAIT: Assistive device utilized: None Level of assistance: Complete Independence Comments: Decreased gait speed and stride length with minimal right knee flexion in swing phase  TREATMENT DATE:  10/08/23 EXERCISE LOG   LB Nustep  x 20 mins   L4 Doing better.  seat 7 [place] combo 1.5 w/cm2 x 10 mins to RT glute IFC x 15 mins 80-150 hz  100% scan to RT glut in hook lying           Exercise Repetitions and Resistance Comments  Nustep  L4 x 15 minutes   Supine glute sets  2.5 minutes w/ 3 second hold    Double knee to chest   LE support on red ball  Bent knee fall out  20 reps each    Supine hip ADD isometric  2.5 minutes w/ 5 second hold    Rocker board  2 minutes    Blank cell = exercise not performed today               08/19/23                       EXERCISE  LOG  Exercise Repetitions and Resistance Comments  Nustep Lvl 3 x 15 mins   Ball Rollout 3 mins   [employer] 3 mins   SKTC 10 reps bil x 3 sec hold   LTR 10 reps each way x 3 sec hold   Rockerboard 3 mins   Towel Stretch (gastroc) 3 reps bil x 15 sec hold    Blank cell = exercise not performed today   PATIENT EDUCATION:  Education details:  healing, anatomy, activity modification, and walking program Person educated: Patient Education method: Explanation Education comprehension: verbalized understanding  HOME EXERCISE PROGRAM:   ASSESSMENT:  CLINICAL IMPRESSION: Pt reports doing okay as long as she doesn't do much. Back flared up 6-7/10  after doing some work 30% better overall.  in the yard. Therex was decreased today due to increased pain. [place] combo and IFC performed to decrease pain. To MD on Tomorrow. Unable to meet LTG's due to pain.   10/08/23 PROGRESS REPORT:  Patient is making minimal progress with skilled physical therapy as evidenced by her subjective reports, objective measures, functional mobility, and progress with skilled physical therapy. She was unable to meet her short or long term goals with pain continuing to be her primary limiting factor. She may benefit from additional medical intervention due to her continued high pain severity and irritability.   Candi Leash, PT, DPT   OBJECTIVE IMPAIRMENTS: Abnormal gait, decreased activity tolerance, decreased balance, decreased mobility, difficulty walking, decreased ROM, decreased strength, hypomobility, impaired flexibility, impaired sensation, impaired tone, postural dysfunction, and pain.   ACTIVITY LIMITATIONS: carrying, lifting, bending, sitting, standing, and locomotion level  PARTICIPATION LIMITATIONS: meal prep, cleaning, laundry, driving, shopping, community activity, and occupation  PERSONAL FACTORS: Past/current experiences, Time since onset of injury/illness/exacerbation, and 3+ comorbidities:  Hypertension, asthma, ADHD, depression, anxiety, and history of cancer  are also affecting patient's functional outcome.   REHAB POTENTIAL: Good  CLINICAL DECISION MAKING: Evolving/moderate complexity  EVALUATION COMPLEXITY: Moderate   GOALS: Goals reviewed with patient? Yes  SHORT TERM GOALS: Target date: 09/07/23  Patient will be independent with her initial HEP. Baseline: Goal status: Partially met  2.  Patient will be able to complete her daily activities without her familiar symptoms exceeding 6/10. Baseline:  Goal status: On going 6- 7/10  3.  Patient will improve her modified Oswestry score to 50% or less for improved perceived function with her daily activities. Baseline:  Goal status: On going  4.  Patient will improve her right hip  flexor strength to at least 4-/5 for improved function with her daily activities. Baseline:  Goal status: On going  LONG TERM GOALS: Target date: 09/28/23  Patient will be independent with her advanced HEP. Baseline:  Goal status: On going  2.  Patient will be able to complete her daily activities without her familiar symptoms exceeding 4/10. Baseline:  Goal status: On going  3.  Patient will be able to demonstrate proper lifting mechanics for a reduced risk of reinjury. Baseline:  Goal status: Partially met  4.  Patient will improve her modified Oswestry score to 40% or less for improved perceived function with her daily activities. Baseline:  Goal status: On going  5.  Patient will be able to ambulate with minimal to no significant gait deviations. Baseline:  Goal status: On going  PLAN:  PT FREQUENCY: 2x/week  PT DURATION: 6 weeks  PLANNED INTERVENTIONS: 97164- PT Re-evaluation, 97110-Therapeutic exercises, 97530- Therapeutic activity, 97112- Neuromuscular re-education, 97535- Self Care, 16109- Manual therapy, [id number]- Gait training, 97014- Electrical stimulation (unattended), 97035- Ultrasound, Patient/Family education,  Balance training, Taping, Dry Needling, Joint mobilization, Spinal mobilization, Cryotherapy, and Moist heat.  PLAN FOR NEXT SESSION: NuStep, lumbar core exs   Nickol Collister,CHRIS, PTA 10/08/2023, 2:56 PM

## 2023-10-13 ENCOUNTER — Ambulatory Visit: Admitting: *Deleted

## 2023-10-13 DIAGNOSIS — M5416 Radiculopathy, lumbar region: Secondary | ICD-10-CM | POA: Diagnosis not present

## 2023-10-13 DIAGNOSIS — M6281 Muscle weakness (generalized): Secondary | ICD-10-CM

## 2023-10-13 NOTE — Therapy (Signed)
 OUTPATIENT PHYSICAL THERAPY THORACOLUMBAR TREATMENT   Patient Name: Deanna [name] MRN: [id number] DOB:[birth year], 63 y.o., female Today's Date: 10/13/2023  END OF SESSION:  PT End of Session - 10/13/23 0942     Visit Number 11    Number of Visits 24    Date for PT Re-Evaluation 11/13/23   New Order 2-3/wk  x 4 weeks   10-13-23   PT Start Time 0930    PT Stop Time 1020    PT Time Calculation (min) 50 min              Past Medical History:  Diagnosis Date   Cancer (HCC)    breast   Diverticulosis of sigmoid colon 07/14/2011   GERD (gastroesophageal reflux disease)    Migraine    PONV (postoperative nausea and vomiting)    Tubular adenoma of colon    Past Surgical History:  Procedure Laterality Date   ABDOMINAL HYSTERECTOMY  02/2010   partial   BREAST REDUCTION SURGERY  02/2011   BREAST SURGERY     cancer   CHOLECYSTECTOMY  07/17/11   w/IOC   COLONOSCOPY     IR INJECT/THERA/INC NEEDLE/CATH/PLC EPI/LUMB/SAC W/IMG  07/20/2023   IR INJECT/THERA/INC NEEDLE/CATH/PLC EPI/LUMB/SAC W/IMG  09/28/2023   IR KYPHO LUMBAR INC FX REDUCE BONE BX UNI/BIL CANNULATION INC/IMAGING  05/28/2023   IR RADIOLOGIST EVAL & MGMT  05/20/2023   IR RADIOLOGIST EVAL & MGMT  06/08/2023   NASAL SINUS SURGERY  02/2011   TONSILLECTOMY  age 26   Patient Active Problem List   Diagnosis Date Noted   BMI 40.0-44.9, adult (HCC) 04/24/2021   Depression, major, single episode, mild (HCC) 03/18/2018   GAD (generalized anxiety disorder) 03/18/2018   Attention deficit hyperactivity disorder (ADHD), predominantly inattentive type 01/20/2018   Migraine 11/12/2017   Asthma 09/05/2016   GERD (gastroesophageal reflux disease) 07/14/2011   Diverticulosis of sigmoid colon 07/14/2011   ESSENTIAL HYPERTENSION, BENIGN 06/07/2009    PCP: Bennie Pierini, FNP  REFERRING PROVIDER: Dorothy Spark, PA-C   REFERRING DIAG: Vertebrogenic low back pain   Rationale for Evaluation and Treatment:  Rehabilitation  THERAPY DIAG:  Radiculopathy, lumbar region  Muscle weakness (generalized)  ONSET DATE: August 2024  SUBJECTIVE:                                                                                                                                                                                           SUBJECTIVE STATEMENT: Patient reports 4-5/10 LBP/ RT glut pain when she got up, but now 7/10. MD said to continue with PT 12 more visits  PERTINENT HISTORY:  Hypertension, asthma, ADHD,  depression, anxiety, and history of cancer  PAIN:  Are you having pain? Yes: NPRS scale: 4-5/10 Pain location: low back radiating to the right medial knee Pain description: constant ache and shooting Aggravating factors: prolonged sitting (45 minutes at most), standing (45 minutes at most), cooking, cleaning, and walking Relieving factors: heating pad  PRECAUTIONS: Fall  RED FLAGS: None   WEIGHT BEARING RESTRICTIONS: No  FALLS:  Has patient fallen in last 6 months? Yes. Number of falls 2  LIVING ENVIRONMENT: Lives with: lives alone Lives in: House/apartment Stairs: No Has following equipment at home: None  OCCUPATION: [employer]: primarily sitting, but may lift up to 25 pounds, 9 hour shift, but not working currently and she is undecided if she will return  PLOF: Independent  PATIENT GOALS: return to her exercise program, improved posture, improved lifting mechanics, and be able to go outside and hike or work in her garden    NEXT MD VISIT: 09/03/23  OBJECTIVE:  Note: Objective measures were completed at Evaluation unless otherwise noted.  DIAGNOSTIC FINDINGS:  IMPRESSION: 1. No acute fracture or traumatic listhesis of the thoracic or lumbar spine. 2. Postoperative changes of L1 vertebral augmentation. No new fracture of the lumbar spine. 3. Moderate right neural foraminal narrowing at L3-L4 and L4-L5.  PATIENT SURVEYS:  Modified Oswestry 60% disability    COGNITION: Overall cognitive status: Within functional limits for tasks assessed     SENSATION: Patient reports intermittent numbness in her right hip.   POSTURE: increased lumbar lordosis  PALPATION: TTP: bilateral lumbar paraspinals, right QL, and L2-5 spinous processes  LUMBAR ROM:   AROM eval  Flexion 64  Extension 14; "tightness and pulling"   Right lateral flexion   Left lateral flexion   Right rotation 25% limited; "pulling down to the right knee"   Left rotation 25% limited   (Blank rows = not tested)  LOWER EXTREMITY ROM: WFL for activities assessed  LOWER EXTREMITY MMT:    MMT Right eval Left eval  Hip flexion 3+/5; uncomfortable 4/5  Hip extension    Hip abduction    Hip adduction    Hip internal rotation    Hip external rotation    Knee flexion 4/5; right knee pain  4+/5  Knee extension 4/5 5/5  Ankle dorsiflexion 4-/5 4-/5  Ankle plantarflexion    Ankle inversion    Ankle eversion     (Blank rows = not tested)  GAIT: Assistive device utilized: None Level of assistance: Complete Independence Comments: Decreased gait speed and stride length with minimal right knee flexion in swing phase  TREATMENT DATE:  10/13/23 EXERCISE LOG   LB  Nustep  x 20 mins   L4 Doing better.  seat 8 [place] combo 1.5 w/cm2 x 8 mins to RT glute IFC x 15 mins 80-150 hz  100% scan to RT glut in hook lying           Exercise Repetitions and Resistance Comments  Nustep  L4 x 15 minutes   Supine glute sets  2.5 minutes w/ 3 second hold    Double knee to chest   LE support on red ball  Bent knee fall out  20 reps each    Supine hip ADD isometric  2.5 minutes w/ 5 second hold    Rocker board  2 minutes    Blank cell = exercise not performed today               08/19/23                       EXERCISE  LOG  Exercise Repetitions and Resistance Comments  Nustep Lvl 3 x 15 mins   Ball Rollout 3 mins   [employer] 3 mins   SKTC 10 reps bil x 3 sec hold   LTR 10 reps each way x 3 sec hold   Rockerboard 3 mins   Towel Stretch (gastroc) 3 reps bil x 15 sec hold    Blank cell = exercise not performed today   PATIENT EDUCATION:  Education details:  healing, anatomy, activity modification, and walking program Person educated: Patient Education method: Explanation Education comprehension: verbalized understanding  HOME EXERCISE PROGRAM:   ASSESSMENT:  CLINICAL IMPRESSION: Pt reports MD said injection could take up to 3 weeks. She was doing good until getting out of the car. Pt was able to continue with therex today, but no progressions due to pain. Notable piriformis pain/tightness today.    10/08/23 PROGRESS REPORT:  Patient is making minimal progress with skilled physical therapy as evidenced by her subjective reports, objective measures, functional mobility, and progress with skilled physical therapy. She was unable to meet her short or long term goals with pain continuing to be her primary limiting factor. She may benefit from additional medical intervention due to her continued high pain severity and irritability.   Candi Leash, PT, DPT   OBJECTIVE IMPAIRMENTS: Abnormal gait, decreased activity tolerance, decreased balance, decreased mobility, difficulty walking, decreased ROM, decreased strength, hypomobility, impaired flexibility, impaired sensation, impaired tone, postural dysfunction, and pain.   ACTIVITY LIMITATIONS: carrying, lifting, bending, sitting, standing, and locomotion level  PARTICIPATION LIMITATIONS: meal prep, cleaning, laundry, driving, shopping, community activity, and occupation  PERSONAL FACTORS: Past/current experiences, Time since onset of injury/illness/exacerbation, and 3+ comorbidities: Hypertension, asthma, ADHD, depression, anxiety, and history of cancer   are also affecting patient's functional outcome.   REHAB POTENTIAL: Good  CLINICAL DECISION MAKING: Evolving/moderate complexity  EVALUATION COMPLEXITY: Moderate   GOALS: Goals reviewed with patient? Yes  SHORT TERM GOALS: Target date: 09/07/23  Patient will be independent with her initial HEP. Baseline: Goal status: Partially met  2.  Patient will be able to complete her daily activities without her familiar symptoms exceeding 6/10. Baseline:  Goal status: On going 6- 7/10  3.  Patient will improve her modified Oswestry score to 50% or less for improved perceived function with her daily activities. Baseline:  Goal status: On going  4.  Patient will improve her right hip flexor strength to at least 4-/5 for improved function with her daily activities.  Baseline:  Goal status: On going  LONG TERM GOALS: Target date: 09/28/23  Patient will be independent with her advanced HEP. Baseline:  Goal status: On going  2.  Patient will be able to complete her daily activities without her familiar symptoms exceeding 4/10. Baseline:  Goal status: On going  3.  Patient will be able to demonstrate proper lifting mechanics for a reduced risk of reinjury. Baseline:  Goal status: Partially met  4.  Patient will improve her modified Oswestry score to 40% or less for improved perceived function with her daily activities. Baseline:  Goal status: On going  5.  Patient will be able to ambulate with minimal to no significant gait deviations. Baseline:  Goal status: On going  PLAN:  PT FREQUENCY: 2x/week  PT DURATION: 6 weeks  PLANNED INTERVENTIONS: 97164- PT Re-evaluation, 97110-Therapeutic exercises, 97530- Therapeutic activity, 97112- Neuromuscular re-education, 97535- Self Care, [id number]- Manual therapy, [id number]- Gait training, 97014- Electrical stimulation (unattended), 97035- Ultrasound, Patient/Family education, Balance training, Taping, Dry Needling, Joint mobilization, Spinal  mobilization, Cryotherapy, and Moist heat.  PLAN FOR NEXT SESSION: NuStep, lumbar core exs   Daleena Rotter,CHRIS, PTA 10/13/2023, 2:17 PM

## 2023-10-16 ENCOUNTER — Ambulatory Visit: Admitting: *Deleted

## 2023-10-16 DIAGNOSIS — M6281 Muscle weakness (generalized): Secondary | ICD-10-CM

## 2023-10-16 DIAGNOSIS — M5416 Radiculopathy, lumbar region: Secondary | ICD-10-CM

## 2023-10-16 NOTE — Therapy (Signed)
 OUTPATIENT PHYSICAL THERAPY THORACOLUMBAR TREATMENT   Patient Name: Deanna [name] MRN: [id number] DOB:1960/10/29, 63 y.o., female Today's Date: 10/16/2023  END OF SESSION:  PT End of Session - 10/16/23 0936     Visit Number 12    Number of Visits 24    Date for PT Re-Evaluation 11/13/23    PT Start Time 0930    PT Stop Time 1020    PT Time Calculation (min) 50 min              Past Medical History:  Diagnosis Date   Cancer (HCC)    breast   Diverticulosis of sigmoid colon 07/14/2011   GERD (gastroesophageal reflux disease)    Migraine    PONV (postoperative nausea and vomiting)    Tubular adenoma of colon    Past Surgical History:  Procedure Laterality Date   ABDOMINAL HYSTERECTOMY  02/2010   partial   BREAST REDUCTION SURGERY  02/2011   BREAST SURGERY     cancer   CHOLECYSTECTOMY  07/17/11   w/IOC   COLONOSCOPY     IR INJECT/THERA/INC NEEDLE/CATH/PLC EPI/LUMB/SAC W/IMG  07/20/2023   IR INJECT/THERA/INC NEEDLE/CATH/PLC EPI/LUMB/SAC W/IMG  09/28/2023   IR KYPHO LUMBAR INC FX REDUCE BONE BX UNI/BIL CANNULATION INC/IMAGING  05/28/2023   IR RADIOLOGIST EVAL & MGMT  05/20/2023   IR RADIOLOGIST EVAL & MGMT  06/08/2023   NASAL SINUS SURGERY  02/2011   TONSILLECTOMY  age 69   Patient Active Problem List   Diagnosis Date Noted   BMI 40.0-44.9, adult (HCC) 04/24/2021   Depression, major, single episode, mild (HCC) 03/18/2018   GAD (generalized anxiety disorder) 03/18/2018   Attention deficit hyperactivity disorder (ADHD), predominantly inattentive type 01/20/2018   Migraine 11/12/2017   Asthma 09/05/2016   GERD (gastroesophageal reflux disease) 07/14/2011   Diverticulosis of sigmoid colon 07/14/2011   ESSENTIAL HYPERTENSION, BENIGN 06/07/2009    PCP: Bennie Pierini, FNP  REFERRING PROVIDER: Dorothy Spark, PA-C   REFERRING DIAG: Vertebrogenic low back pain   Rationale for Evaluation and Treatment: Rehabilitation  THERAPY DIAG:  Radiculopathy,  lumbar region  Muscle weakness (generalized)  ONSET DATE: August 2024  SUBJECTIVE:                                                                                                                                                                                           SUBJECTIVE STATEMENT: Patient reports 3-4/10 LBP/ RT glut pain.   PERTINENT HISTORY:  Hypertension, asthma, ADHD, depression, anxiety, and history of cancer  PAIN:  Are you having pain? Yes: NPRS scale: 3-4/10 Pain location: low back radiating to the right medial  knee Pain description: constant ache and shooting Aggravating factors: prolonged sitting (45 minutes at most), standing (45 minutes at most), cooking, cleaning, and walking Relieving factors: heating pad  PRECAUTIONS: Fall  RED FLAGS: None   WEIGHT BEARING RESTRICTIONS: No  FALLS:  Has patient fallen in last 6 months? Yes. Number of falls 2  LIVING ENVIRONMENT: Lives with: lives alone Lives in: House/apartment Stairs: No Has following equipment at home: None  OCCUPATION: [employer]: primarily sitting, but may lift up to 25 pounds, 9 hour shift, but not working currently and she is undecided if she will return  PLOF: Independent  PATIENT GOALS: return to her exercise program, improved posture, improved lifting mechanics, and be able to go outside and hike or work in her garden    NEXT MD VISIT: 09/03/23  OBJECTIVE:  Note: Objective measures were completed at Evaluation unless otherwise noted.  DIAGNOSTIC FINDINGS:  IMPRESSION: 1. No acute fracture or traumatic listhesis of the thoracic or lumbar spine. 2. Postoperative changes of L1 vertebral augmentation. No new fracture of the lumbar spine. 3. Moderate right neural foraminal narrowing at L3-L4 and L4-L5.  PATIENT SURVEYS:  Modified Oswestry 60% disability   COGNITION: Overall cognitive status: Within functional limits for tasks assessed     SENSATION: Patient reports  intermittent numbness in her right hip.   POSTURE: increased lumbar lordosis  PALPATION: TTP: bilateral lumbar paraspinals, right QL, and L2-5 spinous processes  LUMBAR ROM:   AROM eval  Flexion 64  Extension 14; "tightness and pulling"   Right lateral flexion   Left lateral flexion   Right rotation 25% limited; "pulling down to the right knee"   Left rotation 25% limited   (Blank rows = not tested)  LOWER EXTREMITY ROM: WFL for activities assessed  LOWER EXTREMITY MMT:    MMT Right eval Left eval  Hip flexion 3+/5; uncomfortable 4/5  Hip extension    Hip abduction    Hip adduction    Hip internal rotation    Hip external rotation    Knee flexion 4/5; right knee pain  4+/5  Knee extension 4/5 5/5  Ankle dorsiflexion 4-/5 4-/5  Ankle plantarflexion    Ankle inversion    Ankle eversion     (Blank rows = not tested)  GAIT: Assistive device utilized: None Level of assistance: Complete Independence Comments: Decreased gait speed and stride length with minimal right knee flexion in swing phase  TREATMENT DATE:                                                                                                                                                                 10/16/23 EXERCISE LOG   LB  Nustep  x 15 mins   L4 Doing better.  seat 8  Standing Bird-dog arm and then leg raise  all x 6 hold 5  secs, then arm and leg holds together  x6  hold 5 secs XTS blue pull downs x 10 hold 5 secs IFC x 15 mins 80-150 hz  100% scan to RT glut in hook lying           Exercise Repetitions and Resistance Comments  Nustep  L4 x 15 minutes   Supine glute sets  2.5 minutes w/ 3 second hold    Double knee to chest   LE support on red ball  Bent knee fall out  20 reps each    Supine hip ADD isometric  2.5 minutes w/ 5 second hold    Rocker board  2 minutes    Blank cell = exercise not performed today               08/19/23                       EXERCISE LOG  Exercise  Repetitions and Resistance Comments  Nustep Lvl 3 x 15 mins   Ball Rollout 3 mins   [employer] 3 mins   SKTC 10 reps bil x 3 sec hold   LTR 10 reps each way x 3 sec hold   Rockerboard 3 mins   Towel Stretch (gastroc) 3 reps bil x 15 sec hold    Blank cell = exercise not performed today   PATIENT EDUCATION:  Education details:  healing, anatomy, activity modification, and walking program Person educated: Patient Education method: Explanation Education comprehension: verbalized understanding  HOME EXERCISE PROGRAM:   ASSESSMENT:  CLINICAL IMPRESSION: Pt reports doing good after last Rx and was able to start back on stabilization exs. She still had notable tenderness in RT glute, but no flare-ups.    10/08/23 PROGRESS REPORT:  Patient is making minimal progress with skilled physical therapy as evidenced by her subjective reports, objective measures, functional mobility, and progress with skilled physical therapy. She was unable to meet her short or long term goals with pain continuing to be her primary limiting factor. She may benefit from additional medical intervention due to her continued high pain severity and irritability.   Candi Leash, PT, DPT   OBJECTIVE IMPAIRMENTS: Abnormal gait, decreased activity tolerance, decreased balance, decreased mobility, difficulty walking, decreased ROM, decreased strength, hypomobility, impaired flexibility, impaired sensation, impaired tone, postural dysfunction, and pain.   ACTIVITY LIMITATIONS: carrying, lifting, bending, sitting, standing, and locomotion level  PARTICIPATION LIMITATIONS: meal prep, cleaning, laundry, driving, shopping, community activity, and occupation  PERSONAL FACTORS: Past/current experiences, Time since onset of injury/illness/exacerbation, and 3+ comorbidities: Hypertension, asthma, ADHD, depression, anxiety, and history of cancer  are also affecting patient's functional outcome.   REHAB POTENTIAL: Good  CLINICAL  DECISION MAKING: Evolving/moderate complexity  EVALUATION COMPLEXITY: Moderate   GOALS: Goals reviewed with patient? Yes  SHORT TERM GOALS: Target date: 09/07/23  Patient will be independent with her initial HEP. Baseline: Goal status: Partially met  2.  Patient will be able to complete her daily activities without her familiar symptoms exceeding 6/10. Baseline:  Goal status: On going 6- 7/10  3.  Patient will improve her modified Oswestry score to 50% or less for improved perceived function with her daily activities. Baseline:  Goal status: On going  4.  Patient will improve her right hip flexor strength to at least 4-/5 for improved function with her daily activities. Baseline:  Goal status: On going  LONG TERM GOALS: Target date: 09/28/23  Patient will be independent with her advanced HEP. Baseline:  Goal status: On going  2.  Patient will be able to complete her daily activities without her familiar symptoms exceeding 4/10. Baseline:  Goal status: On going  3.  Patient will be able to demonstrate proper lifting mechanics for a reduced risk of reinjury. Baseline:  Goal status: Partially met  4.  Patient will improve her modified Oswestry score to 40% or less for improved perceived function with her daily activities. Baseline:  Goal status: On going  5.  Patient will be able to ambulate with minimal to no significant gait deviations. Baseline:  Goal status: On going  PLAN:  PT FREQUENCY: 2x/week  PT DURATION: 6 weeks  PLANNED INTERVENTIONS: 97164- PT Re-evaluation, 97110-Therapeutic exercises, 97530- Therapeutic activity, 97112- Neuromuscular re-education, 97535- Self Care, 56213- Manual therapy, [id number]- Gait training, 97014- Electrical stimulation (unattended), 97035- Ultrasound, Patient/Family education, Balance training, Taping, Dry Needling, Joint mobilization, Spinal mobilization, Cryotherapy, and Moist heat.  PLAN FOR NEXT SESSION: NuStep, lumbar core  exs   Shelisa Fern,CHRIS, PTA 10/16/2023, 10:47 AM

## 2023-10-21 ENCOUNTER — Ambulatory Visit: Admitting: *Deleted

## 2023-10-21 ENCOUNTER — Encounter: Payer: Self-pay | Admitting: *Deleted

## 2023-10-21 DIAGNOSIS — M5416 Radiculopathy, lumbar region: Secondary | ICD-10-CM

## 2023-10-21 DIAGNOSIS — M6281 Muscle weakness (generalized): Secondary | ICD-10-CM

## 2023-10-21 NOTE — Therapy (Signed)
 OUTPATIENT PHYSICAL THERAPY THORACOLUMBAR TREATMENT   Patient Name: Deanna [name] MRN: [id number] DOB:[birth year], 63 y.o., female Today's Date: 10/21/2023  END OF SESSION:  PT End of Session - 10/21/23 1017     Visit Number 13    Number of Visits 24    Date for PT Re-Evaluation 11/13/23    PT Start Time 1015              Past Medical History:  Diagnosis Date   Cancer (HCC)    breast   Diverticulosis of sigmoid colon 07/14/2011   GERD (gastroesophageal reflux disease)    Migraine    PONV (postoperative nausea and vomiting)    Tubular adenoma of colon    Past Surgical History:  Procedure Laterality Date   ABDOMINAL HYSTERECTOMY  02/2010   partial   BREAST REDUCTION SURGERY  02/2011   BREAST SURGERY     cancer   CHOLECYSTECTOMY  07/17/11   w/IOC   COLONOSCOPY     IR INJECT/THERA/INC NEEDLE/CATH/PLC EPI/LUMB/SAC W/IMG  07/20/2023   IR INJECT/THERA/INC NEEDLE/CATH/PLC EPI/LUMB/SAC W/IMG  09/28/2023   IR KYPHO LUMBAR INC FX REDUCE BONE BX UNI/BIL CANNULATION INC/IMAGING  05/28/2023   IR RADIOLOGIST EVAL & MGMT  05/20/2023   IR RADIOLOGIST EVAL & MGMT  06/08/2023   NASAL SINUS SURGERY  02/2011   TONSILLECTOMY  age 71   Patient Active Problem List   Diagnosis Date Noted   BMI 40.0-44.9, adult (HCC) 04/24/2021   Depression, major, single episode, mild (HCC) 03/18/2018   GAD (generalized anxiety disorder) 03/18/2018   Attention deficit hyperactivity disorder (ADHD), predominantly inattentive type 01/20/2018   Migraine 11/12/2017   Asthma 09/05/2016   GERD (gastroesophageal reflux disease) 07/14/2011   Diverticulosis of sigmoid colon 07/14/2011   ESSENTIAL HYPERTENSION, BENIGN 06/07/2009    PCP: Bennie Pierini, FNP  REFERRING PROVIDER: Dorothy Spark, PA-C   REFERRING DIAG: Vertebrogenic low back pain   Rationale for Evaluation and Treatment: Rehabilitation  THERAPY DIAG:  Radiculopathy, lumbar region  Muscle weakness (generalized)  ONSET DATE:  August 2024  SUBJECTIVE:                                                                                                                                                                                           SUBJECTIVE STATEMENT: Patient reports 2-3/10 LBP/ RT glut pain. Doing better at managing my time with activities and pain triggers  PERTINENT HISTORY:  Hypertension, asthma, ADHD, depression, anxiety, and history of cancer  PAIN:  Are you having pain? Yes: NPRS scale: 2-3/10 Pain location: low back radiating to the right medial knee Pain description: constant ache and  shooting Aggravating factors: prolonged sitting (45 minutes at most), standing (45 minutes at most), cooking, cleaning, and walking Relieving factors: heating pad  PRECAUTIONS: Fall  RED FLAGS: None   WEIGHT BEARING RESTRICTIONS: No  FALLS:  Has patient fallen in last 6 months? Yes. Number of falls 2  LIVING ENVIRONMENT: Lives with: lives alone Lives in: House/apartment Stairs: No Has following equipment at home: None  OCCUPATION: [employer]: primarily sitting, but may lift up to 25 pounds, 9 hour shift, but not working currently and she is undecided if she will return  PLOF: Independent  PATIENT GOALS: return to her exercise program, improved posture, improved lifting mechanics, and be able to go outside and hike or work in her garden    NEXT MD VISIT: 09/03/23  OBJECTIVE:  Note: Objective measures were completed at Evaluation unless otherwise noted.  DIAGNOSTIC FINDINGS:  IMPRESSION: 1. No acute fracture or traumatic listhesis of the thoracic or lumbar spine. 2. Postoperative changes of L1 vertebral augmentation. No new fracture of the lumbar spine. 3. Moderate right neural foraminal narrowing at L3-L4 and L4-L5.  PATIENT SURVEYS:  Modified Oswestry 60% disability   COGNITION: Overall cognitive status: Within functional limits for tasks assessed     SENSATION: Patient reports  intermittent numbness in her right hip.   POSTURE: increased lumbar lordosis  PALPATION: TTP: bilateral lumbar paraspinals, right QL, and L2-5 spinous processes  LUMBAR ROM:   AROM eval  Flexion 64  Extension 14; "tightness and pulling"   Right lateral flexion   Left lateral flexion   Right rotation 25% limited; "pulling down to the right knee"   Left rotation 25% limited   (Blank rows = not tested)  LOWER EXTREMITY ROM: WFL for activities assessed  LOWER EXTREMITY MMT:    MMT Right eval Left eval  Hip flexion 3+/5; uncomfortable 4/5  Hip extension    Hip abduction    Hip adduction    Hip internal rotation    Hip external rotation    Knee flexion 4/5; right knee pain  4+/5  Knee extension 4/5 5/5  Ankle dorsiflexion 4-/5 4-/5  Ankle plantarflexion    Ankle inversion    Ankle eversion     (Blank rows = not tested)  GAIT: Assistive device utilized: None Level of assistance: Complete Independence Comments: Decreased gait speed and stride length with minimal right knee flexion in swing phase  TREATMENT DATE:                                                                                                                                                                 10/21/23 EXERCISE LOG   LB  Nustep  x 20 mins   L4 Doing better.  seat 8 Standing Bird-dog arm and leg holds  together  x6  hold 10 secs XTS blue pull downs x 10 hold 10 secs Piriformis stretch x 5  IFC x 15 mins 80-150 hz  100% scan to RT glut in hook lying           Exercise Repetitions and Resistance Comments  Nustep  L4 x 15 minutes   Supine glute sets  2.5 minutes w/ 3 second hold    Double knee to chest   LE support on red ball  Bent knee fall out  20 reps each    Supine hip ADD isometric  2.5 minutes w/ 5 second hold    Rocker board  2 minutes    Blank cell = exercise not performed today               08/19/23                       EXERCISE LOG  Exercise Repetitions and Resistance  Comments  Nustep Lvl 3 x 15 mins   Ball Rollout 3 mins   [employer] 3 mins   SKTC 10 reps bil x 3 sec hold   LTR 10 reps each way x 3 sec hold   Rockerboard 3 mins   Towel Stretch (gastroc) 3 reps bil x 15 sec hold    Blank cell = exercise not performed today   PATIENT EDUCATION:  Education details:  healing, anatomy, activity modification, and walking program Person educated: Patient Education method: Explanation Education comprehension: verbalized understanding  HOME EXERCISE PROGRAM:   ASSESSMENT:  CLINICAL IMPRESSION: Pt reports doing good after last Rx and was able to start back on stabilization exs. and doing better at managing ADLs and act,'s and decreasing pain triggers. RT side glut is still the tightest and sorest. Pt did well with piriformis stretch     10/08/23 PROGRESS REPORT:  Patient is making minimal progress with skilled physical therapy as evidenced by her subjective reports, objective measures, functional mobility, and progress with skilled physical therapy. She was unable to meet her short or long term goals with pain continuing to be her primary limiting factor. She may benefit from additional medical intervention due to her continued high pain severity and irritability.   Candi Leash, PT, DPT   OBJECTIVE IMPAIRMENTS: Abnormal gait, decreased activity tolerance, decreased balance, decreased mobility, difficulty walking, decreased ROM, decreased strength, hypomobility, impaired flexibility, impaired sensation, impaired tone, postural dysfunction, and pain.   ACTIVITY LIMITATIONS: carrying, lifting, bending, sitting, standing, and locomotion level  PARTICIPATION LIMITATIONS: meal prep, cleaning, laundry, driving, shopping, community activity, and occupation  PERSONAL FACTORS: Past/current experiences, Time since onset of injury/illness/exacerbation, and 3+ comorbidities: Hypertension, asthma, ADHD, depression, anxiety, and history of cancer  are also  affecting patient's functional outcome.   REHAB POTENTIAL: Good  CLINICAL DECISION MAKING: Evolving/moderate complexity  EVALUATION COMPLEXITY: Moderate   GOALS: Goals reviewed with patient? Yes  SHORT TERM GOALS: Target date: 09/07/23  Patient will be independent with her initial HEP. Baseline: Goal status: Partially met  2.  Patient will be able to complete her daily activities without her familiar symptoms exceeding 6/10. Baseline:  Goal status: On going 6- 7/10  3.  Patient will improve her modified Oswestry score to 50% or less for improved perceived function with her daily activities. Baseline:  Goal status: On going  4.  Patient will improve her right hip flexor strength to at least 4-/5 for improved function with her daily activities. Baseline:  Goal status:  On going  LONG TERM GOALS: Target date: 09/28/23  Patient will be independent with her advanced HEP. Baseline:  Goal status: On going  2.  Patient will be able to complete her daily activities without her familiar symptoms exceeding 4/10. Baseline:  Goal status: On going  3.  Patient will be able to demonstrate proper lifting mechanics for a reduced risk of reinjury. Baseline:  Goal status: Partially met  4.  Patient will improve her modified Oswestry score to 40% or less for improved perceived function with her daily activities. Baseline:  Goal status: On going  5.  Patient will be able to ambulate with minimal to no significant gait deviations. Baseline:  Goal status: On going  PLAN:  PT FREQUENCY: 2x/week  PT DURATION: 6 weeks  PLANNED INTERVENTIONS: 97164- PT Re-evaluation, 97110-Therapeutic exercises, 97530- Therapeutic activity, 97112- Neuromuscular re-education, 97535- Self Care, 16109- Manual therapy, [id number]- Gait training, 97014- Electrical stimulation (unattended), 97035- Ultrasound, Patient/Family education, Balance training, Taping, Dry Needling, Joint mobilization, Spinal mobilization,  Cryotherapy, and Moist heat.  PLAN FOR NEXT SESSION: NuStep, lumbar core exs   Masoud Nyce,CHRIS, PTA 10/21/2023, 10:21 AM

## 2023-10-23 ENCOUNTER — Encounter: Admitting: *Deleted

## 2023-10-29 ENCOUNTER — Encounter: Admitting: *Deleted

## 2023-11-20 ENCOUNTER — Other Ambulatory Visit: Payer: Self-pay | Admitting: Nurse Practitioner

## 2023-12-18 ENCOUNTER — Other Ambulatory Visit: Payer: Self-pay | Admitting: Nurse Practitioner

## 2023-12-18 ENCOUNTER — Telehealth: Payer: Self-pay | Admitting: Nurse Practitioner

## 2023-12-18 DIAGNOSIS — F320 Major depressive disorder, single episode, mild: Secondary | ICD-10-CM

## 2023-12-18 MED ORDER — BUPROPION HCL ER (XL) 150 MG PO TB24: 150.0000 mg | ORAL_TABLET | Freq: Three times a day (TID) | ORAL | 0 refills | Status: DC

## 2023-12-18 NOTE — Telephone Encounter (Signed)
 Copied from CRM [id number]. Topic: Clinical - Prescription Issue >> Dec 18, 2023 10:26 AM Hamp Levine R wrote: Reason for CRM: Patients prescription for buPROPion  (WELLBUTRIN  XL) 150 MG 24 hr tablet was sent today. Stating that it is incorrect, should be 450mg s/3 pills a day.   Patient can be reached at [phone]

## 2023-12-18 NOTE — Telephone Encounter (Signed)
 Pt aware rx fixed and resent.

## 2024-01-22 ENCOUNTER — Other Ambulatory Visit: Payer: Self-pay | Admitting: Nurse Practitioner

## 2024-01-22 DIAGNOSIS — F411 Generalized anxiety disorder: Secondary | ICD-10-CM

## 2024-02-04 ENCOUNTER — Other Ambulatory Visit: Payer: Self-pay | Admitting: Nurse Practitioner

## 2024-02-04 DIAGNOSIS — I10 Essential (primary) hypertension: Secondary | ICD-10-CM

## 2024-02-04 DIAGNOSIS — F320 Major depressive disorder, single episode, mild: Secondary | ICD-10-CM

## 2024-02-04 DIAGNOSIS — F411 Generalized anxiety disorder: Secondary | ICD-10-CM

## 2024-02-12 ENCOUNTER — Ambulatory Visit: Payer: 59 | Admitting: Nurse Practitioner

## 2024-02-12 ENCOUNTER — Encounter: Payer: Self-pay | Admitting: Nurse Practitioner

## 2024-02-12 VITALS — BP 121/83 | HR 84 | Temp 98.7°F | Ht 64.0 in | Wt 247.0 lb

## 2024-02-12 DIAGNOSIS — R739 Hyperglycemia, unspecified: Secondary | ICD-10-CM

## 2024-02-12 DIAGNOSIS — F411 Generalized anxiety disorder: Secondary | ICD-10-CM

## 2024-02-12 DIAGNOSIS — G43809 Other migraine, not intractable, without status migrainosus: Secondary | ICD-10-CM | POA: Diagnosis not present

## 2024-02-12 DIAGNOSIS — I10 Essential (primary) hypertension: Secondary | ICD-10-CM

## 2024-02-12 DIAGNOSIS — Z6841 Body Mass Index (BMI) 40.0 and over, adult: Secondary | ICD-10-CM

## 2024-02-12 DIAGNOSIS — F900 Attention-deficit hyperactivity disorder, predominantly inattentive type: Secondary | ICD-10-CM

## 2024-02-12 DIAGNOSIS — Z0000 Encounter for general adult medical examination without abnormal findings: Secondary | ICD-10-CM

## 2024-02-12 DIAGNOSIS — F320 Major depressive disorder, single episode, mild: Secondary | ICD-10-CM

## 2024-02-12 DIAGNOSIS — K5730 Diverticulosis of large intestine without perforation or abscess without bleeding: Secondary | ICD-10-CM

## 2024-02-12 DIAGNOSIS — J4530 Mild persistent asthma, uncomplicated: Secondary | ICD-10-CM

## 2024-02-12 DIAGNOSIS — K219 Gastro-esophageal reflux disease without esophagitis: Secondary | ICD-10-CM

## 2024-02-12 DIAGNOSIS — Z0001 Encounter for general adult medical examination with abnormal findings: Secondary | ICD-10-CM | POA: Diagnosis not present

## 2024-02-12 LAB — CBC WITH DIFFERENTIAL/PLATELET

## 2024-02-12 LAB — BAYER DCA HB A1C WAIVED: HB A1C (BAYER DCA - WAIVED): 5.5 % (ref 4.8–5.6)

## 2024-02-12 MED ORDER — AMLODIPINE BESYLATE 5 MG PO TABS: 5.0000 mg | ORAL_TABLET | Freq: Every day | ORAL | 1 refills | Status: DC

## 2024-02-12 MED ORDER — FUROSEMIDE 20 MG PO TABS: 20.0000 mg | ORAL_TABLET | Freq: Every day | ORAL | 1 refills | Status: DC

## 2024-02-12 MED ORDER — OMEPRAZOLE 40 MG PO CPDR: 40.0000 mg | DELAYED_RELEASE_CAPSULE | Freq: Every day | ORAL | 1 refills | Status: DC

## 2024-02-12 MED ORDER — ALPRAZOLAM 1 MG PO TABS: 1.0000 mg | ORAL_TABLET | Freq: Two times a day (BID) | ORAL | 5 refills | Status: DC

## 2024-02-12 MED ORDER — OLMESARTAN MEDOXOMIL 40 MG PO TABS: 40.0000 mg | ORAL_TABLET | Freq: Every day | ORAL | 1 refills | Status: DC

## 2024-02-12 MED ORDER — SUMATRIPTAN SUCCINATE 100 MG PO TABS
ORAL_TABLET | ORAL | 2 refills | Status: AC
Start: 2024-02-12 — End: ?

## 2024-02-12 MED ORDER — BUPROPION HCL ER (XL) 150 MG PO TB24: 150.0000 mg | ORAL_TABLET | Freq: Every day | ORAL | 5 refills | Status: DC

## 2024-02-12 MED ORDER — FLUTICASONE-SALMETEROL 250-50 MCG/ACT IN AEPB: 1.0000 | INHALATION_SPRAY | Freq: Two times a day (BID) | RESPIRATORY_TRACT | 1 refills | Status: DC

## 2024-02-12 NOTE — Patient Instructions (Signed)

## 2024-02-12 NOTE — Progress Notes (Signed)
 Subjective:    Patient ID: Deanna [name], female    DOB: 1960/11/24, 63 y.o.   MRN: [id number]   Chief Complaint:annual physical    HPI:  Deanna [name] is a 62 y.o. who identifies as a female who was assigned female at birth.   Social history: Lives with: husband Work history: [employer]   Comes in today for follow up of the following chronic medical issues:  1. ESSENTIAL HYPERTENSION, BENIGN No c/o chest pain, sob or headache. Doe snot check blood pressure at home. BP Readings from Last 3 Encounters:  08/18/23 128/87  07/03/23 (!) 175/88  05/28/23 136/62     2. Other migraine without status migrainosus, not intractable Has couple of times a month. Imitrex  works well to relieve symptoms.  3. Mild persistent asthma without complication Uses her advair  daily. Only usees albuterol  a couple of times a month.  4. Gastroesophageal reflux disease without esophagitis Omeprazole  is working well for her.  5. Diverticulosis of sigmoid colon No recent flare ups  6. Depression, major, single episode, mild (HCC) Is on wellbutrin  daily and is doing well.     08/18/2023   10:43 AM 01/30/2023    8:14 AM 10/27/2022    9:56 AM  Depression screen PHQ 2/9  Decreased Interest 2 2 2   Down, Depressed, Hopeless 2 2 1   PHQ - 2 Score 4 4 3   Altered sleeping 2 2 1   Tired, decreased energy 2 2 1   Change in appetite 2 2 1   Feeling bad or failure about yourself  2 2 1   Trouble concentrating 2 2 1   Moving slowly or fidgety/restless 0 0 0  Suicidal thoughts 0 0 0  PHQ-9 Score 14 14 8   Difficult doing work/chores Not difficult at all Not difficult at all Somewhat difficult        7. GAD (generalized anxiety disorder) Xanx BID     08/18/2023   10:44 AM 01/30/2023    8:14 AM 10/27/2022    9:57 AM 04/07/2022   11:49 AM  GAD 7 : Generalized Anxiety Score  Nervous, Anxious, on Edge 2 2 1 1   Control/stop worrying 0 0 1 1  Worry too much - different things 2 2 1 1   Trouble  relaxing 0 0 0 0  Restless 0 0 0 0  Easily annoyed or irritable 0 0 0 0  Afraid - awful might happen 0 0 0 0  Total GAD 7 Score 4 4 3 3   Anxiety Difficulty Somewhat difficult Somewhat difficult Not difficult at all Not difficult at all        8. Attention deficit hyperactivity disorder (ADHD), predominantly inattentive type Is on no prescription ADHD meds  9. BMI 40.0-44.9, adult (HCC) Weight is down 13lbs  Wt Readings from Last 3 Encounters:  02/12/24 247 lb (112 kg)  08/18/23 260 lb (117.9 kg)  02/28/23 245 lb (111.1 kg)   BMI Readings from Last 3 Encounters:  02/12/24 42.40 kg/m  08/18/23 44.63 kg/m  02/28/23 42.05 kg/m         New complaints: None today  Allergies  Allergen Reactions   Iodinated Contrast Media Rash    RESPIRATORY ARREST    Iodine Hives, Other (See Comments) and Swelling   Iohexol  Anaphylaxis    IBD dye   Clindamycin /Lincomycin Nausea And Vomiting   Outpatient Encounter Medications as of 02/12/2024  Medication Sig   ALPRAZolam  (XANAX ) 1 MG tablet Take 1 tablet (1 mg total) by mouth 2 (  two) times daily.   amLODipine  (NORVASC ) 5 MG tablet Take 1 tablet (5 mg total) by mouth daily.   buPROPion  (WELLBUTRIN  XL) 150 MG 24 hr tablet TAKE ONE TABLET BY MOUTH IN THE MORNING, AT NOON & AT BEDTIME.   CALCIUM-VITAMIN D PO Take 1 tablet by mouth. 600 mg of calcium   fluticasone  (FLONASE ) 50 MCG/ACT nasal spray Place 2 sprays into both nostrils daily.   fluticasone -salmeterol (ADVAIR  DISKUS) 250-50 MCG/ACT AEPB Inhale 1 puff into the lungs in the morning and at bedtime.   furosemide  (LASIX ) 20 MG tablet TAKE ONE TABLET ONCE DAILY   montelukast  (SINGULAIR ) 10 MG tablet TAKE ONE TABLET BY MOUTH AT BEDTIME.   Multiple Vitamin (MULTIVITAMIN) tablet Take 1 tablet by mouth daily.   olmesartan  (BENICAR ) 40 MG tablet Take 1 tablet (40 mg total) by mouth daily.   omeprazole  (PRILOSEC) 40 MG capsule Take 1 capsule (40 mg total) by mouth daily.   predniSONE   (DELTASONE ) 50 MG tablet Take one tablet 13 hours, 7 hours and 1 hour prior to procedure.   SUMAtriptan  (IMITREX ) 100 MG tablet TAKE 1 TABLET ONCE DAILY AS NEEDED FOR MIGRAINE AS DIRECTED BY PHYSICIAN.   valACYclovir  (VALTREX ) 1000 MG tablet TAKE  (1)  TABLET  EVERY TWELVE HOURS.   VENTOLIN  HFA 108 (90 Base) MCG/ACT inhaler Inhale 2 puffs into the lungs every 6 (six) hours as needed for wheezing or shortness of breath.   No facility-administered encounter medications on file as of 02/12/2024.    Past Surgical History:  Procedure Laterality Date   ABDOMINAL HYSTERECTOMY  02/2010   partial   BREAST REDUCTION SURGERY  02/2011   BREAST SURGERY     cancer   CHOLECYSTECTOMY  07/17/11   w/IOC   COLONOSCOPY     IR INJECT/THERA/INC NEEDLE/CATH/PLC EPI/LUMB/SAC W/IMG  07/20/2023   IR INJECT/THERA/INC NEEDLE/CATH/PLC EPI/LUMB/SAC W/IMG  09/28/2023   IR KYPHO LUMBAR INC FX REDUCE BONE BX UNI/BIL CANNULATION INC/IMAGING  05/28/2023   IR RADIOLOGIST EVAL & MGMT  05/20/2023   IR RADIOLOGIST EVAL & MGMT  06/08/2023   NASAL SINUS SURGERY  02/2011   TONSILLECTOMY  age 80    Family History  Problem Relation Age of Onset   Mental illness Mother    Stroke Mother        died with this at age 33   Heart disease Mother    Hyperlipidemia Mother    Alcohol abuse Father    Cancer Paternal Grandmother        colon   Colon cancer Paternal Grandmother    Alcohol abuse Brother    Asthma Brother    Hypertension Brother    Esophageal cancer Neg Hx    Rectal cancer Neg Hx    Stomach cancer Neg Hx       Controlled substance contract: n/a     Review of Systems  Constitutional:  Negative for diaphoresis.  Eyes:  Negative for pain.  Respiratory:  Negative for shortness of breath.   Cardiovascular:  Negative for chest pain, palpitations and leg swelling.  Gastrointestinal:  Negative for abdominal pain.  Endocrine: Negative for polydipsia.  Skin:  Negative for rash.  Neurological:  Negative for  dizziness, weakness and headaches.  Hematological:  Does not bruise/bleed easily.  All other systems reviewed and are negative.      Objective:   Physical Exam Vitals and nursing note reviewed.  Constitutional:      General: She is not in acute distress.    Appearance:  Normal appearance. She is well-developed.  HENT:     Head: Normocephalic.     Right Ear: Tympanic membrane normal.     Left Ear: Tympanic membrane normal.     Nose: Nose normal.     Mouth/Throat:     Mouth: Mucous membranes are moist.  Eyes:     Pupils: Pupils are equal, round, and reactive to light.  Neck:     Vascular: No carotid bruit or JVD.  Cardiovascular:     Rate and Rhythm: Normal rate and regular rhythm.     Heart sounds: Normal heart sounds.  Pulmonary:     Effort: Pulmonary effort is normal. No respiratory distress.     Breath sounds: Normal breath sounds. No wheezing or rales.  Chest:     Chest wall: No tenderness.  Abdominal:     General: Bowel sounds are normal. There is no distension or abdominal bruit.     Palpations: Abdomen is soft. There is no hepatomegaly, splenomegaly, mass or pulsatile mass.     Tenderness: There is no abdominal tenderness.  Musculoskeletal:        General: Normal range of motion.     Cervical back: Normal range of motion and neck supple.  Lymphadenopathy:     Cervical: No cervical adenopathy.  Skin:    General: Skin is warm and dry.  Neurological:     Mental Status: She is alert and oriented to person, place, and time.     Deep Tendon Reflexes: Reflexes are normal and symmetric.  Psychiatric:        Behavior: Behavior normal.        Thought Content: Thought content normal.        Judgment: Judgment normal.    BP 121/83   Pulse 84   Temp 98.7 F (37.1 C) (Temporal)   Ht 5' 4 (1.626 m)   Wt 247 lb (112 kg)   SpO2 96%   BMI 42.40 kg/m     Hgba1c 5.9%      Assessment & Plan:  Deanna [name] comes in today with chief complaint of annual  physical   Diagnosis and orders addressed:  1. ESSENTIAL HYPERTENSION, BENIGN Low sodium diet - Bayer DCA Hb A1c Waived - CBC with Differential/Platelet - CMP14+EGFR - Lipid panel - amLODipine  (NORVASC ) 5 MG tablet; Take 1 tablet (5 mg total) by mouth daily. (NEEDS TO BE SEEN BEFORE NEXT REFILL)  Dispense: 90 tablet; Refill: 1 - furosemide  (LASIX ) 20 MG tablet; Take 1 tablet (20 mg total) by mouth daily. (NEEDS TO BE SEEN BEFORE NEXT REFILL)  Dispense: 90 tablet; Refill: 1 - olmesartan  (BENICAR ) 40 MG tablet; Take 1 tablet (40 mg total) by mouth every morning. (NEEDS TO BE SEEN BEFORE NEXT REFILL)  Dispense: 90 tablet; Refill: 1  2. Other migraine without status migrainosus, not intractable Avoid caffeine - SUMAtriptan  (IMITREX ) 100 MG tablet; TAKE 1 TABLET ONCE DAILY AS NEEDED FOR MIGRAINE AS DIRECTED BY PHYSICIAN.  Dispense: 30 tablet; Refill: 2  3. Mild persistent asthma without complication - fluticasone -salmeterol (ADVAIR  DISKUS) 250-50 MCG/ACT AEPB; Inhale 1 puff into the lungs in the morning and at bedtime.  Dispense: 180 each; Refill: 1  4. Gastroesophageal reflux disease without esophagitis Avoid spicy foods Do not eat 2 hours prior to bedtime - omeprazole  (PRILOSEC) 40 MG capsule; Take 1 capsule (40 mg total) by mouth daily. (NEEDS TO BE SEEN BEFORE NEXT REFILL)  Dispense: 90 capsule; Refill: 1  5. Diverticulosis of sigmoid colon Watch diet  to prevent flare up  6. Depression, major, single episode, mild (HCC) Stress management - buPROPion  (WELLBUTRIN  XL) 150 MG 24 hr tablet; Take 3 tablets (450 mg total) by mouth daily.  Dispense: 90 tablet; Refill: 1  7. GAD (generalized anxiety disorder) - ALPRAZolam  (XANAX ) 1 MG tablet; Take 1 tablet (1 mg total) by mouth 2 (two) times daily.  Dispense: 60 tablet; Refill: 5  8. Attention deficit hyperactivity disorder (ADHD), predominantly inattentive type  9. BMI 40.0-44.9, adult (HCC) Discussed diet and exercise for person with  BMI >25 Will recheck weight in 3-6 months  - Thyroid  Panel With TSH  10. Prediabetes Watch carbs in diet  Labs pending Health Maintenance reviewed Diet and exercise encouraged  Follow up plan: 6 months   Mary-Margaret Gladis, FNP

## 2024-02-15 LAB — CBC WITH DIFFERENTIAL/PLATELET
Basos: 1
EOS (ABSOLUTE): 0.1 x10E3/uL (ref 0.0–0.2)
Eos: 1
Hematocrit: 47.8 — AB (ref 34.0–46.6)
Hemoglobin: 15.3 g/dL (ref 11.1–15.9)
Immature Granulocytes: 0
Immature Granulocytes: 0 x10E3/uL (ref 0.0–0.1)
Lymphs: 22
MCH: 29.5 pg (ref 26.6–33.0)
MCHC: 32 g/dL (ref 31.5–35.7)
MCV: 92 fL (ref 79–97)
Monocytes Absolute: 0.1 x10E3/uL (ref 0.0–0.4)
Monocytes Absolute: 0.3 x10E3/uL (ref 0.1–0.9)
Monocytes: 5
Neutrophils Absolute: 1.7 x10E3/uL (ref 0.7–3.1)
Neutrophils Absolute: 5.4 x10E3/uL (ref 1.4–7.0)
Neutrophils: 71
Platelets: 352 x10E3/uL (ref 150–450)
RBC: 5.19 x10E6/uL (ref 3.77–5.28)
RDW: 13.1 (ref 11.7–15.4)
WBC: 7.6 x10E3/uL (ref 3.4–10.8)

## 2024-02-15 LAB — CMP14+EGFR
ALT: 22 IU/L (ref 0–32)
AST: 26 IU/L (ref 0–40)
Albumin: 4.4 g/dL (ref 3.9–4.9)
Alkaline Phosphatase: 86 IU/L (ref 44–121)
BUN/Creatinine Ratio: 23 (ref 12–28)
BUN: 21 mg/dL (ref 8–27)
Bilirubin Total: 0.3 mg/dL (ref 0.0–1.2)
CO2: 19 mmol/L — ABNORMAL LOW (ref 20–29)
Calcium: 9.4 mg/dL (ref 8.7–10.3)
Chloride: 103 mmol/L (ref 96–106)
Creatinine, Ser: 0.92 mg/dL (ref 0.57–1.00)
Globulin, Total: 2.6 g/dL (ref 1.5–4.5)
Glucose: 102 mg/dL — ABNORMAL HIGH (ref 70–99)
Potassium: 4.3 mmol/L (ref 3.5–5.2)
Sodium: 139 mmol/L (ref 134–144)
Total Protein: 7 g/dL (ref 6.0–8.5)
eGFR: 70 mL/min/1.73 (ref >59)

## 2024-02-15 LAB — LIPID PANEL
Chol/HDL Ratio: 4.1 ratio (ref 0.0–4.4)
Cholesterol, Total: 175 mg/dL (ref 100–199)
HDL: 43 mg/dL (ref >39)
LDL Chol Calc (NIH): 113 mg/dL — ABNORMAL HIGH (ref 0–99)
Triglycerides: 106 mg/dL (ref 0–149)
VLDL Cholesterol Cal: 19 mg/dL (ref 5–40)

## 2024-02-19 ENCOUNTER — Other Ambulatory Visit: Payer: Self-pay | Admitting: Nurse Practitioner

## 2024-02-19 DIAGNOSIS — K219 Gastro-esophageal reflux disease without esophagitis: Secondary | ICD-10-CM

## 2024-02-22 ENCOUNTER — Ambulatory Visit: Payer: Self-pay | Admitting: Nurse Practitioner

## 2024-02-24 ENCOUNTER — Other Ambulatory Visit: Payer: Self-pay | Admitting: Nurse Practitioner

## 2024-02-24 DIAGNOSIS — I10 Essential (primary) hypertension: Secondary | ICD-10-CM

## 2024-03-07 ENCOUNTER — Telehealth: Payer: Self-pay | Admitting: Family Medicine

## 2024-03-07 ENCOUNTER — Other Ambulatory Visit: Payer: Self-pay | Admitting: Nurse Practitioner

## 2024-03-07 DIAGNOSIS — F320 Major depressive disorder, single episode, mild: Secondary | ICD-10-CM

## 2024-03-07 MED ORDER — BUPROPION HCL ER (XL) 150 MG PO TB24: 450.0000 mg | ORAL_TABLET | Freq: Every day | ORAL | 1 refills | Status: DC

## 2024-03-07 NOTE — Telephone Encounter (Signed)
 Copied from CRM [id number]. Topic: Clinical - Prescription Issue >> Mar 07, 2024  3:41 PM Ivette P wrote: Reason for CRM: Pt called in because she is ready to get refilled for buPROPion  (WELLBUTRIN  XL) 150 MG, pt current dosage is   Sig: Take 1 tablet (150 mg total) by mouth daily.  Pt states she currently takes 450MG  and takes 3 pills a day and is requesting this to be updated before she goes to pharmacy to get next refill. Pls reach out to pt for questions and call pharmacy to confirm order.    Pt callback [phone]

## 2024-05-26 ENCOUNTER — Other Ambulatory Visit: Payer: Self-pay | Admitting: Nurse Practitioner

## 2024-08-12 ENCOUNTER — Ambulatory Visit: Payer: Self-pay | Admitting: Nurse Practitioner

## 2024-08-12 ENCOUNTER — Encounter: Payer: Self-pay | Admitting: Nurse Practitioner

## 2024-08-12 VITALS — BP 111/78 | HR 84 | Temp 97.3°F | Ht 64.0 in | Wt 253.8 lb

## 2024-08-12 DIAGNOSIS — J4530 Mild persistent asthma, uncomplicated: Secondary | ICD-10-CM

## 2024-08-12 DIAGNOSIS — K5730 Diverticulosis of large intestine without perforation or abscess without bleeding: Secondary | ICD-10-CM | POA: Diagnosis not present

## 2024-08-12 DIAGNOSIS — R739 Hyperglycemia, unspecified: Secondary | ICD-10-CM | POA: Diagnosis not present

## 2024-08-12 DIAGNOSIS — F900 Attention-deficit hyperactivity disorder, predominantly inattentive type: Secondary | ICD-10-CM | POA: Diagnosis not present

## 2024-08-12 DIAGNOSIS — K219 Gastro-esophageal reflux disease without esophagitis: Secondary | ICD-10-CM

## 2024-08-12 DIAGNOSIS — I10 Essential (primary) hypertension: Secondary | ICD-10-CM

## 2024-08-12 DIAGNOSIS — Z6841 Body Mass Index (BMI) 40.0 and over, adult: Secondary | ICD-10-CM | POA: Diagnosis not present

## 2024-08-12 DIAGNOSIS — G43809 Other migraine, not intractable, without status migrainosus: Secondary | ICD-10-CM

## 2024-08-12 DIAGNOSIS — F320 Major depressive disorder, single episode, mild: Secondary | ICD-10-CM

## 2024-08-12 DIAGNOSIS — F411 Generalized anxiety disorder: Secondary | ICD-10-CM

## 2024-08-12 LAB — LIPID PANEL

## 2024-08-12 LAB — BAYER DCA HB A1C WAIVED: HB A1C (BAYER DCA - WAIVED): 5.8 % — ABNORMAL HIGH (ref 4.8–5.6)

## 2024-08-12 MED ORDER — ALPRAZOLAM 1 MG PO TABS: 1.0000 mg | ORAL_TABLET | Freq: Two times a day (BID) | ORAL | 5 refills | Status: AC

## 2024-08-12 MED ORDER — OMEPRAZOLE 40 MG PO CPDR: 40.0000 mg | DELAYED_RELEASE_CAPSULE | Freq: Every day | ORAL | 1 refills | Status: AC

## 2024-08-12 MED ORDER — MONTELUKAST SODIUM 10 MG PO TABS: 10.0000 mg | ORAL_TABLET | Freq: Every day | ORAL | 1 refills | Status: AC

## 2024-08-12 MED ORDER — OLMESARTAN MEDOXOMIL 40 MG PO TABS: 40.0000 mg | ORAL_TABLET | Freq: Every day | ORAL | 1 refills | Status: AC

## 2024-08-12 MED ORDER — FLUTICASONE-SALMETEROL 250-50 MCG/ACT IN AEPB: 1.0000 | INHALATION_SPRAY | Freq: Two times a day (BID) | RESPIRATORY_TRACT | 1 refills | Status: AC

## 2024-08-12 MED ORDER — BUPROPION HCL ER (XL) 150 MG PO TB24: 450.0000 mg | ORAL_TABLET | Freq: Every day | ORAL | 1 refills | Status: AC

## 2024-08-12 MED ORDER — FUROSEMIDE 20 MG PO TABS: 20.0000 mg | ORAL_TABLET | Freq: Every day | ORAL | 1 refills | Status: AC

## 2024-08-12 MED ORDER — AMLODIPINE BESYLATE 5 MG PO TABS: 5.0000 mg | ORAL_TABLET | Freq: Every day | ORAL | 1 refills | Status: AC

## 2024-08-12 MED ORDER — WEGOVY 1.5 MG PO TABS: 1.5000 mg | ORAL_TABLET | Freq: Every day | ORAL | 2 refills | Status: AC

## 2024-08-12 NOTE — Patient Instructions (Signed)
 Semaglutide  Injection (Weight Management) What is this medication? SEMAGLUTIDE  (SEM a GLOO tide) promotes weight loss. It may also be used to maintain weight loss.  It works by decreasing appetite. It can be used to lower the risk of heart attack and stroke in people affected by excess weight. Changes to diet and exercise are often combined with this medication. This medicine may be used for other purposes; ask your health care provider or pharmacist if you have questions. COMMON BRAND NAME(S): Wegovy  What should I tell my care team before I take this medication? They need to know if you have any of these conditions: Diabetes Eye disease caused by diabetes Gallbladder disease Have or have had depression Have or have had pancreatitis Having surgery Kidney disease Personal or family history of MEN 2, a condition that causes endocrine gland tumors Personal or family history of thyroid  cancer Stomach or intestine problems, such as problems digesting food Suicidal thoughts, plans, or attempt An unusual or allergic reaction to semaglutide , other medications, foods, dyes, or preservatives Pregnant or trying to get pregnant Breastfeeding How should I use this medication? This medication is injected under the skin. You will be taught how to prepare and give it. Take it as directed on the prescription label. It is given once every week (every 7 days). Keep taking it unless your care team tells you to stop. It is important that you put your used needles and pens in a special sharps container. Do not put them in a trash can. If you do not have a sharps container, call your pharmacist or care team to get one. A special MedGuide will be given to you by the pharmacist with each prescription and refill. Be sure to read this information carefully each time. This medication comes with INSTRUCTIONS FOR USE. Ask your pharmacist for directions on how to use this medication. Read the information carefully. Talk  to your pharmacist or care team if you have questions. Talk to your care team about the use of this medication in children. While it may be prescribed for children as young as 12 years for selected conditions, precautions do apply. Overdosage: If you think you have taken too much of this medicine contact a poison control center or emergency room at once. NOTE: This medicine is only for you. Do not share this medicine with others. What if I miss a dose? If you miss a dose and the next scheduled dose is more than 2 days away, take the missed dose as soon as possible. If you miss a dose and the next scheduled dose is less than 2 days away, do not take the missed dose. Take the next dose at your regular time. Do not take double or extra doses. If you miss your dose for 2 weeks or more, take the next dose at your regular time or call your care team to talk about how to restart this medication. What may interact with this medication? Insulin and other medications for diabetes This list may not describe all possible interactions. Give your health care provider a list of all the medicines, herbs, non-prescription drugs, or dietary supplements you use. Also tell them if you smoke, drink alcohol, or use illegal drugs. Some items may interact with your medicine. What should I watch for while using this medication? Visit your care team for regular checks on your progress. Tell your care team if your condition does not start to get better or if it gets worse. Tell your care team if  you are taking medication to treat diabetes, such as insulin or glipizide. This may increase your risk of low blood sugar. Know the symptoms of low blood sugar and how to treat it. Talk to your care team about your risk of cancer. You may be more at risk for certain types of cancer if you take this medication. Talk to your care team right away if you have a lump or swelling in your neck, hoarseness that does not go away, trouble  swallowing, shortness of breath, or trouble breathing. Make sure you stay hydrated while taking this medication. Drink water  often. Eat fruits and veggies that have a high water  content. Drink more water  when it is hot or you are active. Talk to your care team right away if you have fever, infection, vomiting, diarrhea, or if you sweat a lot while taking this medication. The loss of too much body fluid may make it dangerous for you to take this medication. If you are going to need surgery or a procedure, tell your care team that you are taking this medication. Do not take this medication without first talking to your care team if you may be or could become pregnant. Your care team can help you find the option that works for you. Weight loss is not recommended during pregnancy. Talk to your care team if you are breastfeeding. When recommended, this medication may be taken. Its use during breastfeeding has not been well studied. Your care team may suggest other options. What side effects may I notice from receiving this medication? Side effects that you should report to your care team as soon as possible: Allergic reactions--skin rash, itching, hives, swelling of the face, lips, tongue, or throat Change in vision Dehydration--increased thirst, dry mouth, feeling faint or lightheaded, headache, dark yellow or brown urine Fast or irregular heartbeat Gallbladder problems--severe stomach pain, nausea, vomiting, fever Kidney injury--decrease in the amount of urine, swelling of the ankles, hands, or feet Pancreatitis--severe stomach pain that spreads to your back or gets worse after eating or when touched, fever, nausea, vomiting Thoughts of suicide or self-harm, worsening mood, feelings of depression Thyroid  cancer--new mass or lump in the neck, pain or trouble swallowing, trouble breathing, hoarseness Side effects that usually do not require medical attention (report these to your care team if they  continue or are bothersome): Constipation Diarrhea Loss of appetite Nausea Upset stomach This list may not describe all possible side effects. Call your doctor for medical advice about side effects. You may report side effects to FDA at 1-800-FDA-1088. Where should I keep my medication? Keep out of the reach of children and pets. Refrigeration (preferred): Store in the refrigerator. Do not freeze. Keep this medication in the original container until you are ready to take it. Get rid of any unused medication after the expiration date. Room temperature: If needed, prior to cap removal, the pen can be stored at room temperature for up to 28 days. Protect from light. If it is stored at room temperature, get rid of any unused medication after 28 days or after it expires, whichever is first. It is important to get rid of the medication as soon as you no longer need it or it is expired. You can do this in two ways: Take the medication to a medication take-back program. Check with your pharmacy or law enforcement to find a location. If you cannot return the medication, follow the directions in the MedGuide. NOTE: This sheet is a summary. It may not  cover all possible information. If you have questions about this medicine, talk to your doctor, pharmacist, or health care provider.  2025 Elsevier/Gold Standard (2023-06-30 00:00:00)

## 2024-08-12 NOTE — Progress Notes (Signed)
 "  Subjective:    Patient ID: Deanna [name], female    DOB: 1960/09/05, 64 y.o.   MRN: [id number]   Chief Complaint: medical management of chronic issues     HPI:  Deanna [name] is a 64 y.o. who identifies as a female who was assigned female at birth.   Social history: Lives with: husband Work history: [employer]   Comes in today for follow up of the following chronic medical issues:  1. ESSENTIAL HYPERTENSION, BENIGN No c/o chest pain, sob or headache. Doe snot check blood pressure at home. BP Readings from Last 3 Encounters:  02/12/24 121/83  08/18/23 128/87  07/03/23 (!) 175/88     2. Other migraine without status migrainosus, not intractable Has couple of times a month. Imitrex  works well to relieve symptoms.  3. Mild persistent asthma without complication Uses her advair  daily. Only usees albuterol  a couple of times a month.  4. Gastroesophageal reflux disease without esophagitis Omeprazole  is working well for her.  5. Diverticulosis of sigmoid colon No recent flare ups  6. Depression, major, single episode, mild (HCC) Is on wellbutrin  daily and is doing well.    08/12/2024   11:46 AM 08/18/2023   10:43 AM 01/30/2023    8:14 AM  Depression screen PHQ 2/9  Decreased Interest 2 2 2   Down, Depressed, Hopeless 2 2 2   PHQ - 2 Score 4 4 4   Altered sleeping 2 2 2   Tired, decreased energy 2 2 2   Change in appetite 2 2 2   Feeling bad or failure about yourself  0 2 2  Trouble concentrating 0 2 2  Moving slowly or fidgety/restless 0 0 0  Suicidal thoughts 0 0 0  PHQ-9 Score 10 14  14    Difficult doing work/chores Somewhat difficult Not difficult at all Not difficult at all     Data saved with a previous flowsheet row definition      7. GAD (generalized anxiety disorder) Xanx BID     08/12/2024   11:47 AM 08/18/2023   10:44 AM 01/30/2023    8:14 AM 10/27/2022    9:57 AM  GAD 7 : Generalized Anxiety Score  Nervous, Anxious, on Edge 1 2 2 1    Control/stop worrying 2 0 0 1  Worry too much - different things 2 2 2 1   Trouble relaxing 0 0 0 0  Restless 0 0 0 0  Easily annoyed or irritable 0 0 0 0  Afraid - awful might happen 0 0 0 0  Total GAD 7 Score 5 4 4 3   Anxiety Difficulty Somewhat difficult Somewhat difficult Somewhat difficult Not difficult at all       8. Attention deficit hyperactivity disorder (ADHD), predominantly inattentive type Is on no prescription ADHD meds  9. BMI 40.0-44.9, adult (HCC) Weight is up 6lbs  Wt Readings from Last 3 Encounters:  08/12/24 253 lb 12.8 oz (115.1 kg)  02/12/24 247 lb (112 kg)  08/18/23 260 lb (117.9 kg)   BMI Readings from Last 3 Encounters:  08/12/24 43.56 kg/m  02/12/24 42.40 kg/m  08/18/23 44.63 kg/m     New complaints: None today  Allergies  Allergen Reactions   Iodinated Contrast Media Rash    RESPIRATORY ARREST    Iodine Hives, Other (See Comments) and Swelling   Iohexol  Anaphylaxis    IBD dye   Clindamycin /Lincomycin Nausea And Vomiting   Outpatient Encounter Medications as of 08/12/2024  Medication Sig   ALPRAZolam  (XANAX ) 1  MG tablet Take 1 tablet (1 mg total) by mouth 2 (two) times daily.   amLODipine  (NORVASC ) 5 MG tablet Take 1 tablet (5 mg total) by mouth daily.   buPROPion  (WELLBUTRIN  XL) 150 MG 24 hr tablet Take 3 tablets (450 mg total) by mouth daily.   CALCIUM-VITAMIN D PO Take 1 tablet by mouth. 600 mg of calcium   fluticasone  (FLONASE ) 50 MCG/ACT nasal spray Place 2 sprays into both nostrils daily.   fluticasone -salmeterol (ADVAIR  DISKUS) 250-50 MCG/ACT AEPB Inhale 1 puff into the lungs in the morning and at bedtime.   furosemide  (LASIX ) 20 MG tablet Take 1 tablet (20 mg total) by mouth daily.   montelukast  (SINGULAIR ) 10 MG tablet TAKE ONE TABLET BY MOUTH AT BEDTIME.   Multiple Vitamin (MULTIVITAMIN) tablet Take 1 tablet by mouth daily.   olmesartan  (BENICAR ) 40 MG tablet Take 1 tablet (40 mg total) by mouth daily.   omeprazole   (PRILOSEC) 40 MG capsule Take 1 capsule (40 mg total) by mouth daily.   SUMAtriptan  (IMITREX ) 100 MG tablet TAKE 1 TABLET ONCE DAILY AS NEEDED FOR MIGRAINE AS DIRECTED BY PHYSICIAN.   valACYclovir  (VALTREX ) 1000 MG tablet TAKE  (1)  TABLET  EVERY TWELVE HOURS.   VENTOLIN  HFA 108 (90 Base) MCG/ACT inhaler Inhale 2 puffs into the lungs every 6 (six) hours as needed for wheezing or shortness of breath.   No facility-administered encounter medications on file as of 08/12/2024.    Past Surgical History:  Procedure Laterality Date   ABDOMINAL HYSTERECTOMY  02/2010   partial   BREAST REDUCTION SURGERY  02/2011   BREAST SURGERY     cancer   CHOLECYSTECTOMY  07/17/11   w/IOC   COLONOSCOPY     IR INJECT/THERA/INC NEEDLE/CATH/PLC EPI/LUMB/SAC W/IMG  07/20/2023   IR INJECT/THERA/INC NEEDLE/CATH/PLC EPI/LUMB/SAC W/IMG  09/28/2023   IR KYPHO LUMBAR INC FX REDUCE BONE BX UNI/BIL CANNULATION INC/IMAGING  05/28/2023   IR RADIOLOGIST EVAL & MGMT  05/20/2023   IR RADIOLOGIST EVAL & MGMT  06/08/2023   NASAL SINUS SURGERY  02/2011   TONSILLECTOMY  age 32    Family History  Problem Relation Age of Onset   Mental illness Mother    Stroke Mother        died with this at age 81   Heart disease Mother    Hyperlipidemia Mother    Alcohol abuse Father    Cancer Paternal Grandmother        colon   Colon cancer Paternal Grandmother    Alcohol abuse Brother    Asthma Brother    Hypertension Brother    Esophageal cancer Neg Hx    Rectal cancer Neg Hx    Stomach cancer Neg Hx       Controlled substance contract: 08/12/24     Review of Systems  Constitutional:  Negative for diaphoresis.  Eyes:  Negative for pain.  Respiratory:  Negative for shortness of breath.   Cardiovascular:  Negative for chest pain, palpitations and leg swelling.  Gastrointestinal:  Negative for abdominal pain.  Endocrine: Negative for polydipsia.  Skin:  Negative for rash.  Neurological:  Negative for dizziness, weakness  and headaches.  Hematological:  Does not bruise/bleed easily.  All other systems reviewed and are negative.      Objective:   Physical Exam Vitals and nursing note reviewed.  Constitutional:      General: She is not in acute distress.    Appearance: Normal appearance. She is well-developed.  HENT:  Head: Normocephalic.     Right Ear: Tympanic membrane normal.     Left Ear: Tympanic membrane normal.     Nose: Nose normal.     Mouth/Throat:     Mouth: Mucous membranes are moist.  Eyes:     Pupils: Pupils are equal, round, and reactive to light.  Neck:     Vascular: No carotid bruit or JVD.  Cardiovascular:     Rate and Rhythm: Normal rate and regular rhythm.     Heart sounds: Normal heart sounds.  Pulmonary:     Effort: Pulmonary effort is normal. No respiratory distress.     Breath sounds: Normal breath sounds. No wheezing or rales.  Chest:     Chest wall: No tenderness.  Abdominal:     General: Bowel sounds are normal. There is no distension or abdominal bruit.     Palpations: Abdomen is soft. There is no hepatomegaly, splenomegaly, mass or pulsatile mass.     Tenderness: There is no abdominal tenderness.  Musculoskeletal:        General: Normal range of motion.     Cervical back: Normal range of motion and neck supple.  Lymphadenopathy:     Cervical: No cervical adenopathy.  Skin:    General: Skin is warm and dry.  Neurological:     Mental Status: She is alert and oriented to person, place, and time.     Deep Tendon Reflexes: Reflexes are normal and symmetric.  Psychiatric:        Behavior: Behavior normal.        Thought Content: Thought content normal.        Judgment: Judgment normal.     BP 111/78   Pulse 84   Temp (!) 97.3 F (36.3 C) (Skin)   Ht 5' 4 (1.626 m)   Wt 253 lb 12.8 oz (115.1 kg)   BMI 43.56 kg/m     Hgba1c 5.9%      Assessment & Plan:  Deanna [name] comes in today with chief complaint of medical management of chronic issues     Diagnosis and orders addressed:  1. ESSENTIAL HYPERTENSION, BENIGN Low sodium diet - Bayer DCA Hb A1c Waived - CBC with Differential/Platelet - CMP14+EGFR - Lipid panel - amLODipine  (NORVASC ) 5 MG tablet; Take 1 tablet (5 mg total) by mouth daily. (NEEDS TO BE SEEN BEFORE NEXT REFILL)  Dispense: 90 tablet; Refill: 1 - furosemide  (LASIX ) 20 MG tablet; Take 1 tablet (20 mg total) by mouth daily. (NEEDS TO BE SEEN BEFORE NEXT REFILL)  Dispense: 90 tablet; Refill: 1 - olmesartan  (BENICAR ) 40 MG tablet; Take 1 tablet (40 mg total) by mouth every morning. (NEEDS TO BE SEEN BEFORE NEXT REFILL)  Dispense: 90 tablet; Refill: 1  2. Other migraine without status migrainosus, not intractable Avoid caffeine - SUMAtriptan  (IMITREX ) 100 MG tablet; TAKE 1 TABLET ONCE DAILY AS NEEDED FOR MIGRAINE AS DIRECTED BY PHYSICIAN.  Dispense: 30 tablet; Refill: 2  3. Mild persistent asthma without complication - fluticasone -salmeterol (ADVAIR  DISKUS) 250-50 MCG/ACT AEPB; Inhale 1 puff into the lungs in the morning and at bedtime.  Dispense: 180 each; Refill: 1  4. Gastroesophageal reflux disease without esophagitis Avoid spicy foods Do not eat 2 hours prior to bedtime - omeprazole  (PRILOSEC) 40 MG capsule; Take 1 capsule (40 mg total) by mouth daily. (NEEDS TO BE SEEN BEFORE NEXT REFILL)  Dispense: 90 capsule; Refill: 1  5. Diverticulosis of sigmoid colon Watch diet to prevent flare up  6. Depression,  major, single episode, mild (HCC) Stress management - buPROPion  (WELLBUTRIN  XL) 150 MG 24 hr tablet; Take 3 tablets (450 mg total) by mouth daily.  Dispense: 90 tablet; Refill: 1  7. GAD (generalized anxiety disorder) - ALPRAZolam  (XANAX ) 1 MG tablet; Take 1 tablet (1 mg total) by mouth 2 (two) times daily.  Dispense: 60 tablet; Refill: 5  8. Attention deficit hyperactivity disorder (ADHD), predominantly inattentive type  9. BMI 40.0-44.9, adult (HCC) Discussed diet and exercise for person with BMI  >25 Will recheck weight in 3-6 months  - Thyroid  Panel With TSH  10. Prediabetes Watch carbs in diet  Labs pending Health Maintenance reviewed Diet and exercise encouraged  Follow up plan: 6 months   Mary-Margaret Gladis, FNP   "

## 2024-08-13 LAB — CBC WITH DIFFERENTIAL/PLATELET
Basophils Absolute: 0.1 x10E3/uL (ref 0.0–0.2)
Basos: 1 %
EOS (ABSOLUTE): 0.1 x10E3/uL (ref 0.0–0.4)
Eos: 2 %
Hematocrit: 48.6 % — ABNORMAL HIGH (ref 34.0–46.6)
Hemoglobin: 15.8 g/dL (ref 11.1–15.9)
Immature Grans (Abs): 0 x10E3/uL (ref 0.0–0.1)
Immature Granulocytes: 0 %
Lymphocytes Absolute: 2.5 x10E3/uL (ref 0.7–3.1)
Lymphs: 33 %
MCH: 29.8 pg (ref 26.6–33.0)
MCHC: 32.5 g/dL (ref 31.5–35.7)
MCV: 92 fL (ref 79–97)
Monocytes Absolute: 0.8 x10E3/uL (ref 0.1–0.9)
Monocytes: 10 %
Neutrophils Absolute: 4.1 x10E3/uL (ref 1.4–7.0)
Neutrophils: 54 %
Platelets: 383 x10E3/uL (ref 150–450)
RBC: 5.31 x10E6/uL — ABNORMAL HIGH (ref 3.77–5.28)
RDW: 13.4 % (ref 11.7–15.4)
WBC: 7.6 x10E3/uL (ref 3.4–10.8)

## 2024-08-13 LAB — CMP14+EGFR
ALT: 22 IU/L (ref 0–32)
AST: 19 IU/L (ref 0–40)
Albumin: 4.7 g/dL (ref 3.9–4.9)
Alkaline Phosphatase: 74 IU/L (ref 49–135)
BUN/Creatinine Ratio: 21 (ref 12–28)
BUN: 22 mg/dL (ref 8–27)
Bilirubin Total: 0.5 mg/dL (ref 0.0–1.2)
CO2: 21 mmol/L (ref 20–29)
Calcium: 9.3 mg/dL (ref 8.7–10.3)
Chloride: 102 mmol/L (ref 96–106)
Creatinine, Ser: 1.03 mg/dL — ABNORMAL HIGH (ref 0.57–1.00)
Globulin, Total: 2.5 g/dL (ref 1.5–4.5)
Glucose: 115 mg/dL — ABNORMAL HIGH (ref 70–99)
Potassium: 4.4 mmol/L (ref 3.5–5.2)
Sodium: 139 mmol/L (ref 134–144)
Total Protein: 7.2 g/dL (ref 6.0–8.5)
eGFR: 61 mL/min/1.73

## 2024-08-13 LAB — LIPID PANEL
Chol/HDL Ratio: 5.4 ratio — ABNORMAL HIGH (ref 0.0–4.4)
Cholesterol, Total: 196 mg/dL (ref 100–199)
HDL: 36 mg/dL — ABNORMAL LOW
LDL Chol Calc (NIH): 123 mg/dL — ABNORMAL HIGH (ref 0–99)
Triglycerides: 211 mg/dL — ABNORMAL HIGH (ref 0–149)
VLDL Cholesterol Cal: 37 mg/dL (ref 5–40)

## 2024-08-15 ENCOUNTER — Ambulatory Visit: Payer: Self-pay | Admitting: Nurse Practitioner

## 2024-08-19 LAB — TOXASSURE SELECT 13 (MW), URINE

## 2024-08-22 ENCOUNTER — Other Ambulatory Visit: Payer: Self-pay | Admitting: Nurse Practitioner

## 2024-08-22 DIAGNOSIS — F411 Generalized anxiety disorder: Secondary | ICD-10-CM

## 2024-08-25 ENCOUNTER — Telehealth: Payer: Self-pay

## 2024-08-25 ENCOUNTER — Other Ambulatory Visit: Payer: Self-pay | Admitting: Nurse Practitioner

## 2024-08-25 DIAGNOSIS — I10 Essential (primary) hypertension: Secondary | ICD-10-CM

## 2024-08-25 DIAGNOSIS — K219 Gastro-esophageal reflux disease without esophagitis: Secondary | ICD-10-CM

## 2024-08-25 NOTE — Telephone Encounter (Signed)
 Called and spoke with patient and made her aware that it is because of the controlled med she is on and the contract she signed at her visit on 08/19/2024 that's uploaded in her chart, does state that she will be drug screened for the medication. She voiced understanding.

## 2024-08-25 NOTE — Telephone Encounter (Signed)
 Copied from CRM #[id number]. Topic: Clinical - Lab/Test Results >> Aug 25, 2024 10:48 AM Deanna [name] wrote: Reason for CRM: The pt had lab work done on 1/16 and is asking why a drug screen was included. She reports taking Alprazolam  (xanax ) and believes this may be the reason. Please follow up the patient at [phone]

## 2024-08-26 ENCOUNTER — Other Ambulatory Visit: Payer: Self-pay | Admitting: Nurse Practitioner

## 2024-08-26 DIAGNOSIS — I10 Essential (primary) hypertension: Secondary | ICD-10-CM

## 2024-08-26 DIAGNOSIS — F320 Major depressive disorder, single episode, mild: Secondary | ICD-10-CM

## 2024-08-29 ENCOUNTER — Other Ambulatory Visit (HOSPITAL_COMMUNITY): Payer: Self-pay

## 2024-08-29 ENCOUNTER — Telehealth: Payer: Self-pay | Admitting: Pharmacy Technician

## 2024-08-29 NOTE — Telephone Encounter (Signed)
 Pharmacy Patient Advocate Encounter   Received notification from [facility] that prior authorization for Wegovy  1.5MG  tablets is required/requested.   Insurance verification completed.   The patient is insured through [facility].   Per test claim: Per test claim, medication is not covered due to plan/benefit exclusion, PA not submitted at this time

## 2024-08-30 NOTE — Telephone Encounter (Signed)
Attempted to contact- mailboxfull  

## 2024-09-01 NOTE — Telephone Encounter (Signed)
 Spoke with patient and made her aware.

## 2025-01-31 ENCOUNTER — Ambulatory Visit: Admitting: Nurse Practitioner
